# Patient Record
Sex: Male | Born: 1965 | Race: Black or African American | Hispanic: No | Marital: Single | Smoking: Former smoker
Health system: Southern US, Community
[De-identification: ages and names within clinical notes are randomized; demographics above are authoritative.]

## PROBLEM LIST (undated history)

## (undated) HISTORY — PX: ABDOMINAL SURGERY: SHX537

---

## 2006-04-11 ENCOUNTER — Emergency Department (HOSPITAL_COMMUNITY): Admission: EM | Admit: 2006-04-11 | Discharge: 2006-04-11 | Payer: Self-pay | Admitting: Emergency Medicine

## 2007-01-03 ENCOUNTER — Emergency Department (HOSPITAL_COMMUNITY): Admission: EM | Admit: 2007-01-03 | Discharge: 2007-01-03 | Payer: Self-pay | Admitting: Emergency Medicine

## 2011-05-31 ENCOUNTER — Emergency Department (HOSPITAL_COMMUNITY): Payer: Self-pay

## 2011-05-31 ENCOUNTER — Encounter: Payer: Self-pay | Admitting: *Deleted

## 2011-05-31 ENCOUNTER — Emergency Department (HOSPITAL_COMMUNITY)
Admission: EM | Admit: 2011-05-31 | Discharge: 2011-05-31 | Disposition: A | Discharge status: Home / Self Care | Payer: Self-pay | Attending: Emergency Medicine | Admitting: Emergency Medicine

## 2011-05-31 DIAGNOSIS — S82202B Unspecified fracture of shaft of left tibia, initial encounter for open fracture type I or II: Secondary | ICD-10-CM

## 2011-05-31 DIAGNOSIS — M79609 Pain in unspecified limb: Secondary | ICD-10-CM | POA: Insufficient documentation

## 2011-05-31 DIAGNOSIS — W3400XA Accidental discharge from unspecified firearms or gun, initial encounter: Secondary | ICD-10-CM | POA: Insufficient documentation

## 2011-05-31 DIAGNOSIS — S81809A Unspecified open wound, unspecified lower leg, initial encounter: Secondary | ICD-10-CM | POA: Insufficient documentation

## 2011-05-31 DIAGNOSIS — M25579 Pain in unspecified ankle and joints of unspecified foot: Secondary | ICD-10-CM | POA: Insufficient documentation

## 2011-05-31 DIAGNOSIS — Y9229 Other specified public building as the place of occurrence of the external cause: Secondary | ICD-10-CM | POA: Insufficient documentation

## 2011-05-31 DIAGNOSIS — S82209B Unspecified fracture of shaft of unspecified tibia, initial encounter for open fracture type I or II: Secondary | ICD-10-CM | POA: Insufficient documentation

## 2011-05-31 DIAGNOSIS — S81009A Unspecified open wound, unspecified knee, initial encounter: Secondary | ICD-10-CM | POA: Insufficient documentation

## 2011-05-31 MED ORDER — CEFAZOLIN SODIUM 1-5 GM-% IV SOLN
1.0000 g | Freq: Once | INTRAVENOUS | Status: AC
  Administered 2011-05-31: 1 g via INTRAVENOUS
  Filled 2011-05-31: qty 50

## 2011-05-31 MED ORDER — FENTANYL CITRATE 0.05 MG/ML IJ SOLN
50.0000 ug | Freq: Once | INTRAMUSCULAR | Status: AC
  Administered 2011-05-31: 50 ug via INTRAVENOUS
  Filled 2011-05-31: qty 2

## 2011-05-31 MED ORDER — IBUPROFEN 800 MG PO TABS: 800.0000 mg | ORAL_TABLET | Freq: Three times a day (TID) | ORAL | Status: AC

## 2011-05-31 MED ORDER — CEPHALEXIN 500 MG PO CAPS: 500.0000 mg | ORAL_CAPSULE | Freq: Four times a day (QID) | ORAL | Status: AC

## 2011-05-31 MED ORDER — FENTANYL CITRATE 0.05 MG/ML IJ SOLN
50.0000 ug | Freq: Once | INTRAMUSCULAR | Status: AC
  Administered 2011-05-31: 50 ug via INTRAVENOUS

## 2011-05-31 MED ORDER — ONDANSETRON HCL 4 MG/2ML IJ SOLN
4.0000 mg | Freq: Once | INTRAMUSCULAR | Status: AC
  Administered 2011-05-31: 4 mg via INTRAVENOUS
  Filled 2011-05-31: qty 2

## 2011-05-31 MED ORDER — FENTANYL CITRATE 0.05 MG/ML IJ SOLN
100.0000 ug | Freq: Once | INTRAMUSCULAR | Status: AC
  Administered 2011-05-31: 100 ug via INTRAVENOUS
  Filled 2011-05-31: qty 2

## 2011-05-31 MED ORDER — TETANUS-DIPHTH-ACELL PERTUSSIS 5-2.5-18.5 LF-MCG/0.5 IM SUSP
0.5000 mL | Freq: Once | INTRAMUSCULAR | Status: AC
  Administered 2011-05-31: 0.5 mL via INTRAMUSCULAR
  Filled 2011-05-31: qty 0.5

## 2011-05-31 MED ORDER — HYDROCODONE-ACETAMINOPHEN 5-500 MG PO TABS: 1.0000 | ORAL_TABLET | Freq: Four times a day (QID) | ORAL | Status: AC | PRN

## 2011-05-31 NOTE — Progress Notes (Signed)
Orthopedic Tech Progress Note Patient Details:  Hector Manning July 29, 1965 [id number]  Type of Splint: Long leg Splint Location: applied to left leg crutches  Gaye Pollack 05/31/2011, 8:01 AM
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
## 2011-05-31 NOTE — Consult Note (Signed)
Full consult dictated [phone] Dictation number

## 2011-05-31 NOTE — ED Notes (Signed)
Ortho tech at the bedside applying cast
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
## 2013-03-15 ENCOUNTER — Encounter (HOSPITAL_COMMUNITY): Payer: Self-pay | Admitting: Emergency Medicine

## 2013-03-15 ENCOUNTER — Emergency Department (HOSPITAL_COMMUNITY)
Admission: EM | Admit: 2013-03-15 | Discharge: 2013-03-15 | Disposition: A | Discharge status: Home / Self Care | Payer: Self-pay | Attending: Emergency Medicine | Admitting: Emergency Medicine

## 2013-03-15 ENCOUNTER — Emergency Department (HOSPITAL_COMMUNITY): Payer: Self-pay

## 2013-03-15 DIAGNOSIS — M5430 Sciatica, unspecified side: Secondary | ICD-10-CM | POA: Insufficient documentation

## 2013-03-15 DIAGNOSIS — M5431 Sciatica, right side: Secondary | ICD-10-CM

## 2013-03-15 DIAGNOSIS — Y9389 Activity, other specified: Secondary | ICD-10-CM | POA: Insufficient documentation

## 2013-03-15 DIAGNOSIS — Y929 Unspecified place or not applicable: Secondary | ICD-10-CM | POA: Insufficient documentation

## 2013-03-15 DIAGNOSIS — X12XXXA Contact with other hot fluids, initial encounter: Secondary | ICD-10-CM | POA: Insufficient documentation

## 2013-03-15 DIAGNOSIS — Y990 Civilian activity done for income or pay: Secondary | ICD-10-CM | POA: Insufficient documentation

## 2013-03-15 DIAGNOSIS — F17200 Nicotine dependence, unspecified, uncomplicated: Secondary | ICD-10-CM | POA: Insufficient documentation

## 2013-03-15 MED ORDER — PREDNISONE 20 MG PO TABS: 40.0000 mg | ORAL_TABLET | Freq: Every day | ORAL | Status: DC

## 2013-03-15 MED ORDER — TRAMADOL HCL 50 MG PO TABS: 50.0000 mg | ORAL_TABLET | Freq: Four times a day (QID) | ORAL | Status: DC | PRN

## 2013-03-15 MED ORDER — DIAZEPAM 5 MG PO TABS
5.0000 mg | ORAL_TABLET | Freq: Once | ORAL | Status: AC
  Administered 2013-03-15: 5 mg via ORAL
  Filled 2013-03-15: qty 1

## 2013-03-15 MED ORDER — CYCLOBENZAPRINE HCL 10 MG PO TABS: 10.0000 mg | ORAL_TABLET | Freq: Two times a day (BID) | ORAL | Status: DC | PRN

## 2013-03-15 MED ORDER — KETOROLAC TROMETHAMINE 60 MG/2ML IM SOLN
60.0000 mg | Freq: Once | INTRAMUSCULAR | Status: AC
Start: 2013-03-15 — End: 2013-03-15
  Administered 2013-03-15: 60 mg via INTRAMUSCULAR
  Filled 2013-03-15: qty 2

## 2013-03-15 MED ORDER — PREDNISONE 20 MG PO TABS
40.0000 mg | ORAL_TABLET | Freq: Once | ORAL | Status: AC
  Administered 2013-03-15: 40 mg via ORAL
  Filled 2013-03-15: qty 2

## 2013-03-15 NOTE — ED Notes (Signed)
Patient presents to ED with c/o back pain for 3 weeks and right foot is numb for 2 weeks. Patient states he was at work 3 weeks ago and bent over when his back started hurting.

## 2013-03-15 NOTE — ED Provider Notes (Signed)
CSN: [id number]     Arrival date & time 03/15/13  0854 History   First MD Initiated Contact with Patient 03/15/13 [phone]     Chief Complaint  Patient presents with  . Back Pain   (Consider location/radiation/quality/duration/timing/severity/associated sxs/prior Treatment) HPI Comments: Patient is a 47 year old male who presents with sudden onset of lower back pain that started this 3 weeks ago. 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 The pain is aching and severe and radiates down his right leg. The pain is constant. Movement makes the pain worse. Nothing makes the pain better. Patient has not tried anything for pain. No associated symptoms. No saddle paresthesias or bladder/bowel incontinence.     Patient is a 47 y.o. male presenting with back pain.  Back Pain   History reviewed. No pertinent past medical history. History reviewed. No pertinent past surgical history. History reviewed. No pertinent family history. History  Substance Use Topics  . Smoking status: Current Every Day Smoker  . Smokeless tobacco: Not on file  . Alcohol Use: Yes    Review of Systems  Musculoskeletal: Positive for back pain.  All other systems reviewed and are negative.    Allergies  Review of patient's allergies indicates no known allergies.  Home Medications  No current outpatient prescriptions on file. BP 123/75  Pulse 71  Temp(Src) 98 F (36.7 C) (Oral)  Resp 18  Ht 5\' 11"  (1.803 m)  Wt 218 lb (98.884 kg)  BMI 30.42 kg/m2  SpO2 99% Physical Exam  Nursing note and vitals reviewed. Constitutional: He is oriented to person, place, and time. He appears well-developed and well-nourished. No distress.  HENT:  Head: Normocephalic and atraumatic.  Eyes: Conjunctivae and EOM are normal.  Neck: Normal range of motion.  Cardiovascular: Normal rate and regular rhythm.  Exam reveals no gallop and no friction rub.   No murmur  heard. Pulmonary/Chest: Effort normal and breath sounds normal. He has no wheezes. He has no rales. He exhibits no tenderness.  Abdominal: Soft. He exhibits no distension. There is no tenderness. There is no rebound and no guarding.  Musculoskeletal: Normal range of motion.  Right lumbosacral paraspinal tenderness to palpation. No midline spine tenderness to palpation.   Neurological: He is alert and oriented to person, place, and time. Coordination normal.  Lower extremity strength intact and equal bilaterally. Patient reports diminished sensation over lateral right leg. Speech is goal-oriented. Moves limbs without ataxia.   Skin: Skin is warm and dry.  Psychiatric: He has a normal mood and affect. His behavior is normal.    ED Course  Procedures (including critical care time) Labs Review Labs Reviewed - No data to display Imaging Review Dg Lumbar Spine Complete  03/15/2013   CLINICAL DATA:  Low back pain with right-sided radicular symptoms  EXAM: LUMBAR SPINE - COMPLETE 4+ VIEW  COMPARISON:  None.  FINDINGS: Frontal, lateral, spot lumbosacral lateral, and bilateral oblique views were obtained. There are 5 non-rib-bearing lumbar type vertebral bodies. There is no fracture or spondylolisthesis. Disk spaces appear intact. There is no appreciable facette arthropathy.  IMPRESSION: No fracture or apparent arthropathy.   Electronically Signed   By: Bretta Bang   On: 03/15/2013 10:01    MDM   1. Sciatica, right     9:32 AM Lumbar spine xray pending. Patient will have IM toradol, PO valium and PO prednisone. No bladder/bowel incontinence or saddle paresthesias. Vitals stable  and patient afebrile.   10:06 AM Xray shows no acute changes or fracture. 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 Patient will be discharged with Tramadol, Flexeril and Prednisone. Patient instructed to return with worsening or concerning symptoms.     Emilia Beck, PA-C 03/15/13 1012

## 2013-03-16 NOTE — ED Provider Notes (Signed)
Medical screening examination/treatment/procedure(s) were performed by non-physician practitioner and as supervising physician I was immediately available for consultation/collaboration.   Joya Gaskins, MD 03/16/13 1122

## 2014-08-16 IMAGING — CR DG LUMBAR SPINE COMPLETE 4+V
5 series · 5 of 5 positions shown · non-contrast
Comparison: None.

CLINICAL DATA: Low back pain with right-sided radicular symptoms

EXAM:
LUMBAR SPINE - COMPLETE 4+ VIEW

[t lumbar spine ap]
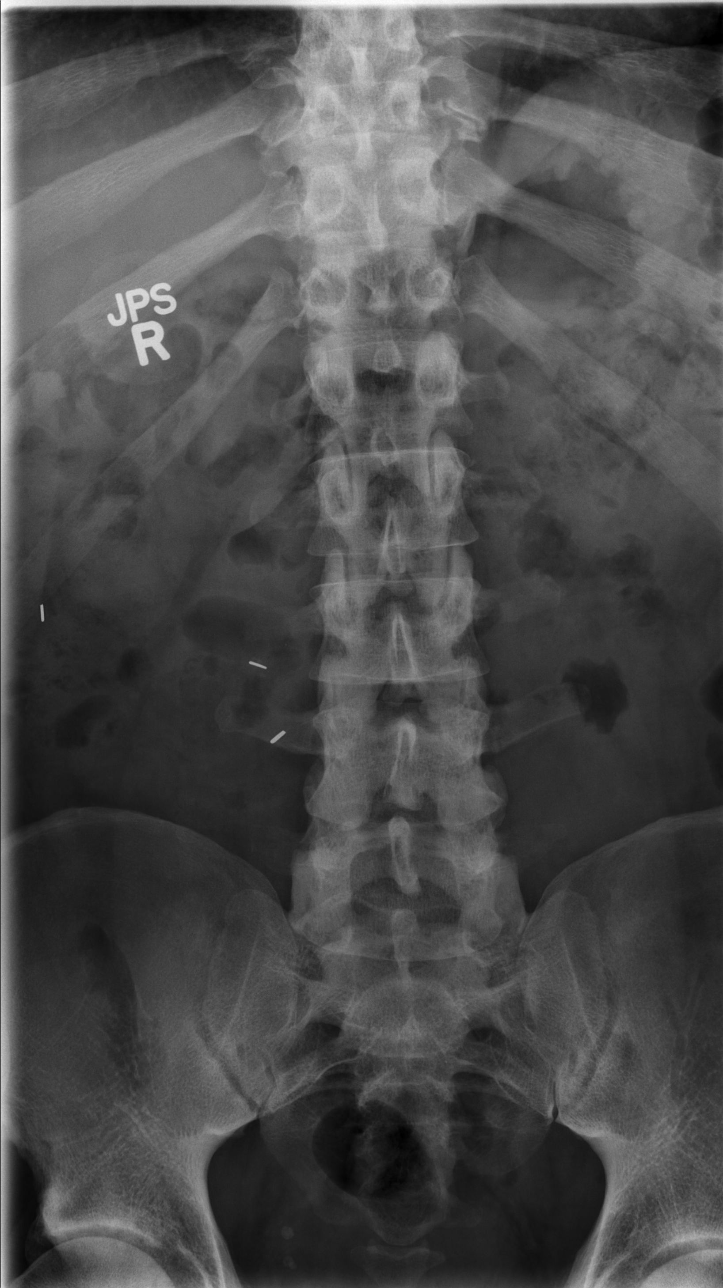

[t lumbar spine obl (1 of 2)]
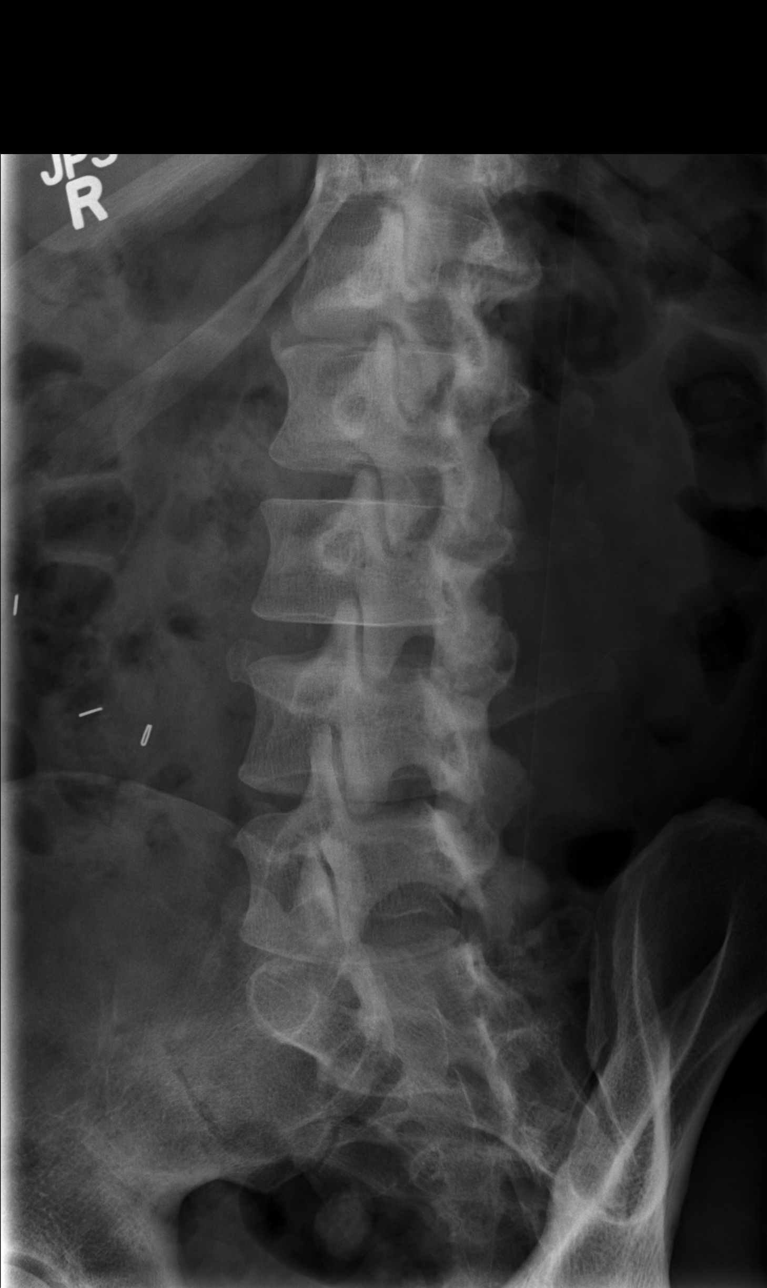

[t lumbar spine obl (2 of 2)]
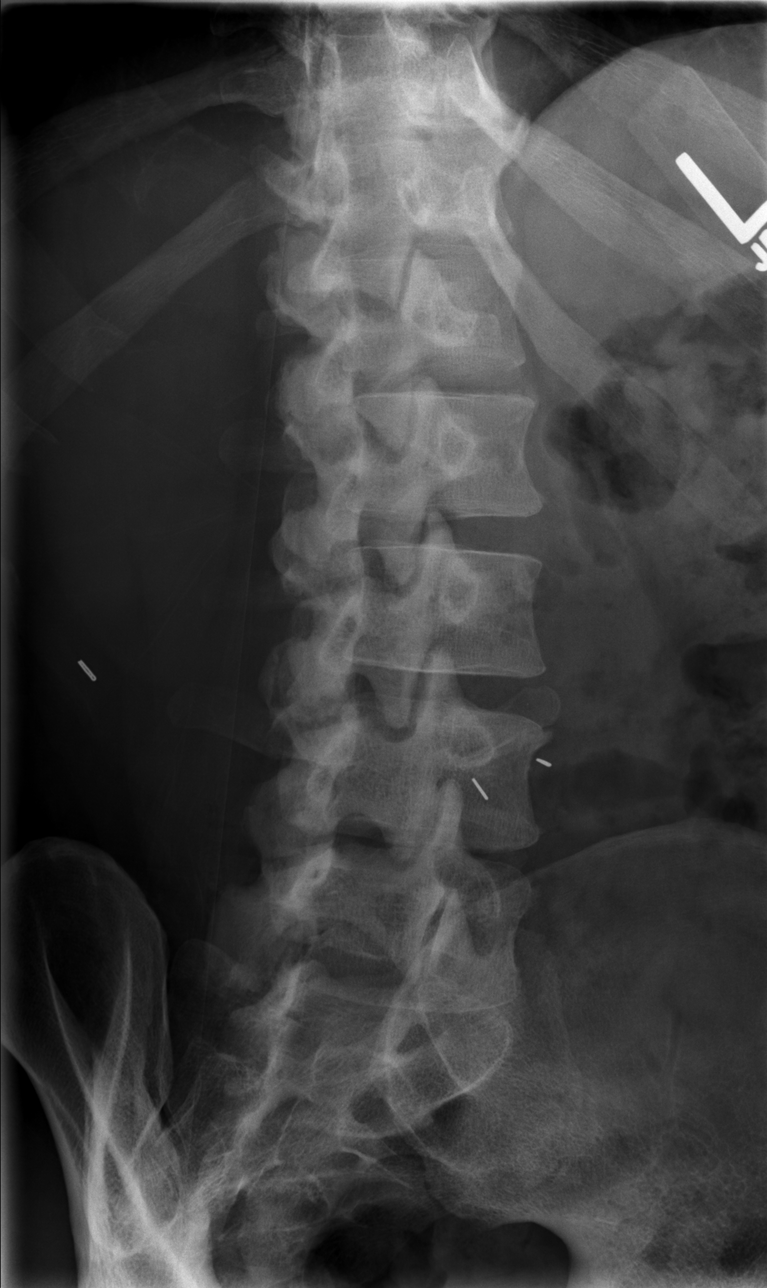

[t lumbar spine lat]
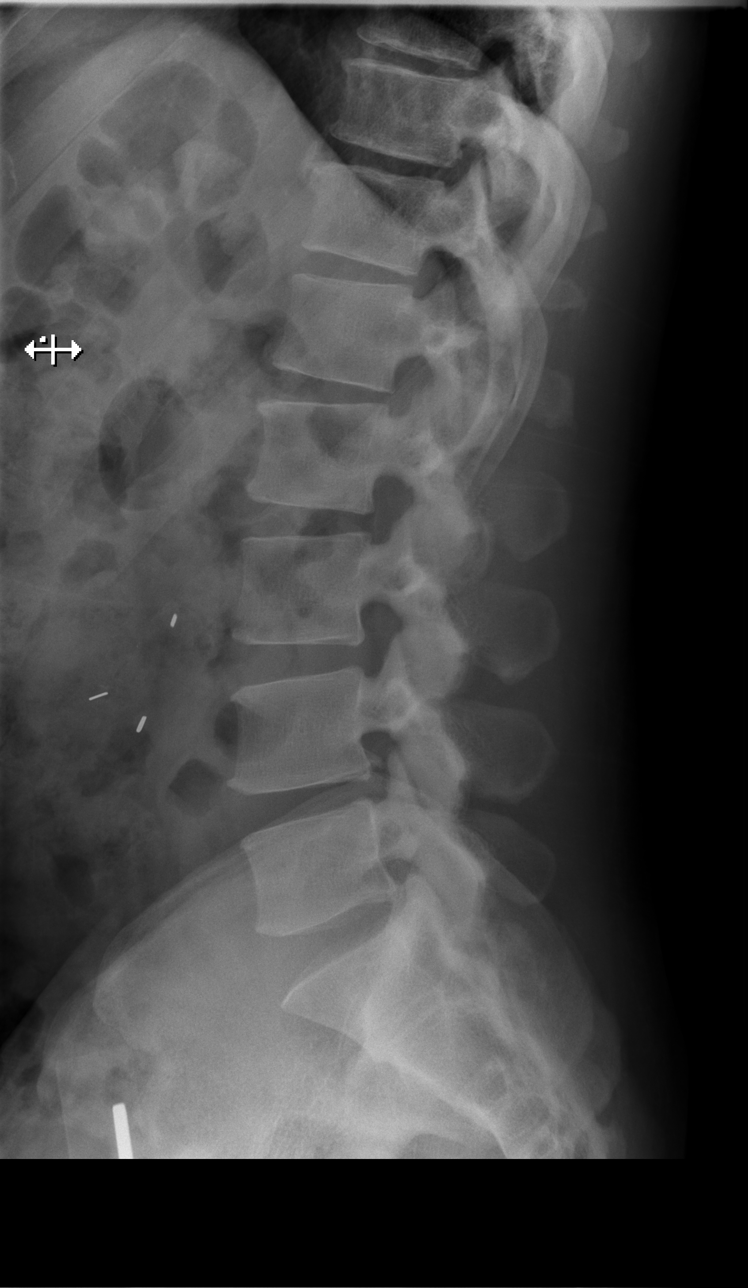

[t lumbar l-5 s-1 spot]
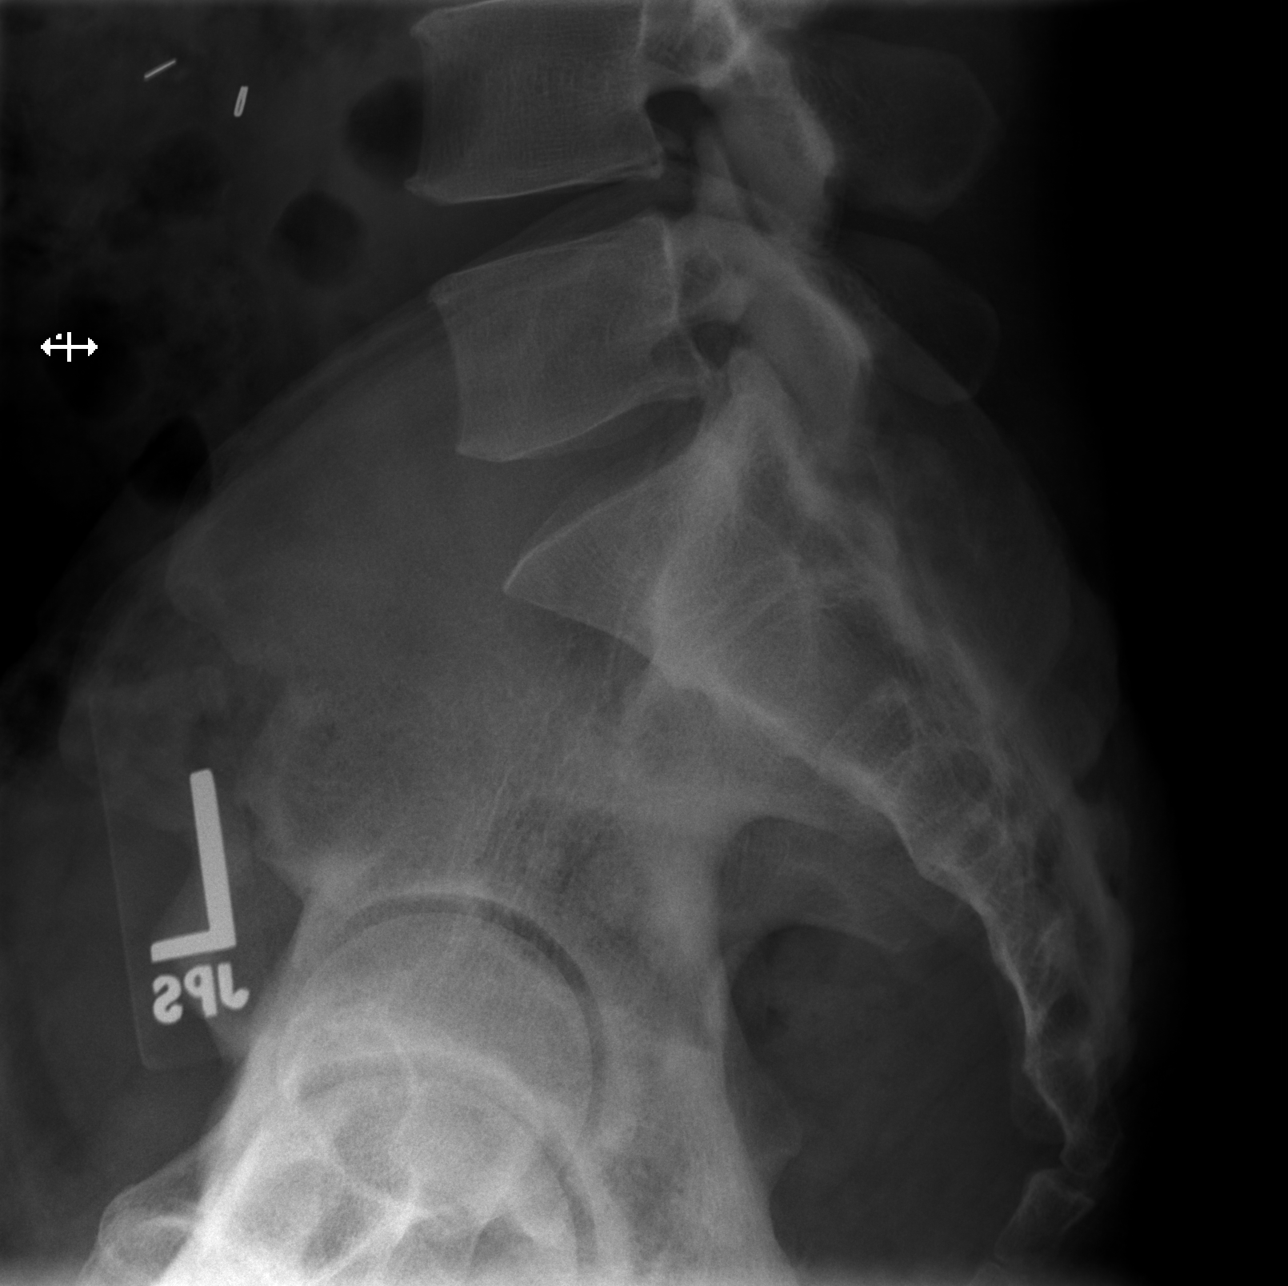

[5 of 5 positions shown; findings below may reference images not displayed]

FINDINGS: Frontal, lateral, spot lumbosacral lateral, and bilateral oblique
views were obtained. There are 5 non-rib-bearing lumbar type
vertebral bodies. There is no fracture or spondylolisthesis. Disk
spaces appear intact. There is no appreciable facette arthropathy.
IMPRESSION: No fracture or apparent arthropathy.

## 2014-12-20 ENCOUNTER — Emergency Department (HOSPITAL_COMMUNITY)
Admission: EM | Admit: 2014-12-20 | Discharge: 2014-12-20 | Discharge status: Against Medical Advice | Payer: Self-pay | Attending: Emergency Medicine | Admitting: Emergency Medicine

## 2014-12-20 ENCOUNTER — Encounter (HOSPITAL_COMMUNITY): Payer: Self-pay | Admitting: *Deleted

## 2014-12-20 DIAGNOSIS — Z720 Tobacco use: Secondary | ICD-10-CM | POA: Insufficient documentation

## 2014-12-20 DIAGNOSIS — R109 Unspecified abdominal pain: Secondary | ICD-10-CM | POA: Insufficient documentation

## 2014-12-20 MED ORDER — ONDANSETRON 4 MG PO TBDP
ORAL_TABLET | ORAL | Status: AC
  Filled 2014-12-20: qty 2

## 2014-12-20 MED ORDER — OXYCODONE-ACETAMINOPHEN 5-325 MG PO TABS
ORAL_TABLET | ORAL | Status: AC
  Filled 2014-12-20: qty 1

## 2014-12-20 MED ORDER — OXYCODONE-ACETAMINOPHEN 5-325 MG PO TABS
1.0000 | ORAL_TABLET | Freq: Once | ORAL | Status: AC
  Administered 2014-12-20: 1 via ORAL

## 2014-12-20 MED ORDER — ONDANSETRON 4 MG PO TBDP
8.0000 mg | ORAL_TABLET | Freq: Once | ORAL | Status: AC
Start: 2014-12-20 — End: 2014-12-20
  Administered 2014-12-20: 8 mg via ORAL

## 2014-12-20 NOTE — ED Notes (Signed)
Pt reports intermittent lower abdominal pain since 2000 tonight. Pt c/o some nausea with the pain, denies v/d.

## 2014-12-20 NOTE — ED Notes (Signed)
Family member came to the front desk stated to nurse patient had left and wanted someone to know.

## 2014-12-21 LAB — COMPREHENSIVE METABOLIC PANEL
ALBUMIN: 3.6 g/dL (ref 3.5–5.0)
ALK PHOS: 52 U/L (ref 38–126)
ALT: 117 U/L — AB (ref 17–63)
ANION GAP: 10 (ref 5–15)
AST: 69 U/L — ABNORMAL HIGH (ref 15–41)
BUN: 14 mg/dL (ref 6–20)
CALCIUM: 8.9 mg/dL (ref 8.9–10.3)
CO2: 25 mmol/L (ref 22–32)
Chloride: 102 mmol/L (ref 101–111)
Creatinine, Ser: 1.14 mg/dL (ref 0.61–1.24)
GFR calc non Af Amer: >60 mL/min (ref >60)
GLUCOSE: 99 mg/dL (ref 65–99)
POTASSIUM: 3.5 mmol/L (ref 3.5–5.1)
SODIUM: 137 mmol/L (ref 135–145)
TOTAL PROTEIN: 6.6 g/dL (ref 6.5–8.1)
Total Bilirubin: 0.4 mg/dL (ref 0.3–1.2)

## 2014-12-21 LAB — CBC WITH DIFFERENTIAL/PLATELET
BASOS PCT: 0 % (ref 0–1)
Basophils Absolute: 0 10*3/uL (ref 0.0–0.1)
EOS ABS: 0.3 10*3/uL (ref 0.0–0.7)
Eosinophils Relative: 5 % (ref 0–5)
HEMATOCRIT: 45 % (ref 39.0–52.0)
Hemoglobin: 15.1 g/dL (ref 13.0–17.0)
LYMPHS PCT: 29 % (ref 12–46)
Lymphs Abs: 1.6 10*3/uL (ref 0.7–4.0)
MCH: 30.8 pg (ref 26.0–34.0)
MCHC: 33.6 g/dL (ref 30.0–36.0)
MCV: 91.8 fL (ref 78.0–100.0)
Monocytes Absolute: 0.7 10*3/uL (ref 0.1–1.0)
Monocytes Relative: 14 % — ABNORMAL HIGH (ref 3–12)
Neutro Abs: 2.8 10*3/uL (ref 1.7–7.7)
Neutrophils Relative %: 52 % (ref 43–77)
PLATELETS: 183 10*3/uL (ref 150–400)
RBC: 4.9 MIL/uL (ref 4.22–5.81)
RDW: 13.6 % (ref 11.5–15.5)
WBC: 5.4 10*3/uL (ref 4.0–10.5)

## 2014-12-21 LAB — LIPASE, BLOOD: LIPASE: 116 U/L — AB (ref 22–51)

## 2015-05-04 ENCOUNTER — Emergency Department (HOSPITAL_COMMUNITY)
Admission: EM | Admit: 2015-05-04 | Discharge: 2015-05-04 | Disposition: A | Discharge status: Home / Self Care | Payer: Managed Care, Other (non HMO) | Attending: Emergency Medicine | Admitting: Emergency Medicine

## 2015-05-04 ENCOUNTER — Encounter (HOSPITAL_COMMUNITY): Payer: Self-pay | Admitting: Emergency Medicine

## 2015-05-04 DIAGNOSIS — S8392XA Sprain of unspecified site of left knee, initial encounter: Secondary | ICD-10-CM | POA: Insufficient documentation

## 2015-05-04 DIAGNOSIS — Z720 Tobacco use: Secondary | ICD-10-CM | POA: Insufficient documentation

## 2015-05-04 DIAGNOSIS — Y9289 Other specified places as the place of occurrence of the external cause: Secondary | ICD-10-CM | POA: Insufficient documentation

## 2015-05-04 DIAGNOSIS — Y9389 Activity, other specified: Secondary | ICD-10-CM | POA: Insufficient documentation

## 2015-05-04 DIAGNOSIS — W108XXA Fall (on) (from) other stairs and steps, initial encounter: Secondary | ICD-10-CM | POA: Diagnosis not present

## 2015-05-04 DIAGNOSIS — S3992XA Unspecified injury of lower back, initial encounter: Secondary | ICD-10-CM | POA: Insufficient documentation

## 2015-05-04 DIAGNOSIS — Z7952 Long term (current) use of systemic steroids: Secondary | ICD-10-CM | POA: Diagnosis not present

## 2015-05-04 DIAGNOSIS — Y998 Other external cause status: Secondary | ICD-10-CM | POA: Diagnosis not present

## 2015-05-04 DIAGNOSIS — S8992XA Unspecified injury of left lower leg, initial encounter: Secondary | ICD-10-CM | POA: Diagnosis present

## 2015-05-04 MED ORDER — IBUPROFEN 800 MG PO TABS: 800.0000 mg | ORAL_TABLET | Freq: Three times a day (TID) | ORAL | Status: DC | PRN

## 2015-05-04 MED ORDER — CYCLOBENZAPRINE HCL 10 MG PO TABS: 10.0000 mg | ORAL_TABLET | Freq: Two times a day (BID) | ORAL | Status: DC | PRN

## 2015-05-04 MED ORDER — IBUPROFEN 400 MG PO TABS
800.0000 mg | ORAL_TABLET | Freq: Once | ORAL | Status: AC
  Administered 2015-05-04: 800 mg via ORAL
  Filled 2015-05-04: qty 2

## 2015-05-04 NOTE — ED Provider Notes (Signed)
CSN: [id number]     Arrival date & time 05/04/15  1805 History  By signing my name below, I, Elon SpannerGarrett Cook, attest that this documentation has been prepared under the direction and in the presence of Fayrene HelperBowie Yazeed Pryer, PA-C. Electronically Signed: Elon SpannerGarrett Cook ED Scribe. 05/04/2015. 6:37 PM.    Chief Complaint  Patient presents with  . Fall   The history is provided by the patient. No language interpreter was used.   HPI Comments: Hector Manning is a 49 y.o. male who presents to the Emergency Department complaining of a mechanical fall at 3:00 pm today. The patient reports he lost his footing and fell forward down 8 steps.  He denies head trauma, LOC and complains currently of left knee pain and lower back pain. He is able to ambulate and does not believe he has a fx.  He denies lightheadedness preceding the fall, other complaints.     History reviewed. No pertinent past medical history. Past Surgical History  Procedure Laterality Date  . Abdominal surgery     History reviewed. No pertinent family history. Social History  Substance Use Topics  . Smoking status: Current Every Day Smoker    Types: Cigarettes  . Smokeless tobacco: Never Used  . Alcohol Use: Yes    Review of Systems  Constitutional: Negative for fever.  Musculoskeletal: Positive for back pain and arthralgias.      Allergies  Review of patient's allergies indicates no known allergies.  Home Medications   Prior to Admission medications   Medication Sig Start Date End Date Taking? Authorizing Provider  cyclobenzaprine (FLEXERIL) 10 MG tablet Take 1 tablet (10 mg total) by mouth 2 (two) times daily as needed for muscle spasms. 03/15/13   Kaitlyn Szekalski, PA-C  predniSONE (DELTASONE) 20 MG tablet Take 2 tablets (40 mg total) by mouth daily. Take 40 mg by mouth daily for 3 days, then 20mg  by mouth daily for 3 days, then 10mg  daily for 3 days 03/15/13   Emilia BeckKaitlyn Szekalski, PA-C  traMADol (ULTRAM) 50 MG tablet Take 1 tablet (50  mg total) by mouth every 6 (six) hours as needed for pain. 03/15/13   Kaitlyn Szekalski, PA-C   BP 133/75 mmHg  Pulse 86  Temp(Src) 98.3 F (36.8 C) (Oral)  Resp 14  SpO2 99% Physical Exam  Constitutional: He is oriented to person, place, and time. He appears well-developed and well-nourished. No distress.  HENT:  Head: Normocephalic and atraumatic.  Eyes: Conjunctivae and EOM are normal.  Neck: Neck supple. No tracheal deviation present.  Cardiovascular: Normal rate.   Pulmonary/Chest: Effort normal. No respiratory distress.  Musculoskeletal: Normal range of motion.  No significant midline spine tenderness.  Left knee point tenderness noted to the medial knee at the joint line on palpation.  Negative anterior and posterior drawer test.  Normal varus valgus maneuver.  Able to ambulate.    Neurological: He is alert and oriented to person, place, and time.  Skin: Skin is warm and dry.  Psychiatric: He has a normal mood and affect. His behavior is normal.  Nursing note and vitals reviewed.   ED Course  Procedures (including critical care time)  DIAGNOSTIC STUDIES: Oxygen Saturation is 99% on RA, normal by my interpretation.    COORDINATION OF CARE:  6:36 PM suspect L knee sprrain.  Knee sleeve given.  Will prescribe anti-inflammatory and muscle relaxant.  Will provide orthopaedic referral.  Patient acknowledges and agrees with plan.       MDM   Final  diagnoses:  Fall down steps, initial encounter  Left knee sprain, initial encounter    BP 133/75 mmHg  Pulse 86  Temp(Src) 98.3 F (36.8 C) (Oral)  Resp 14  SpO2 99%  I personally performed the services described in this documentation, which was scribed in my presence. The recorded information has been reviewed and is accurate.      Fayrene Helper, PA-C 05/04/15 1840  Fayrene Helper, PA-C 05/04/15 2159  Pricilla Loveless, MD 05/07/15 [phone]

## 2015-05-04 NOTE — ED Notes (Signed)
Pt sts lower back pain and left knee pain after tripping and falling on steps this morning

## 2015-05-04 NOTE — Discharge Instructions (Signed)

## 2015-06-15 ENCOUNTER — Emergency Department (HOSPITAL_COMMUNITY)
Admission: EM | Admit: 2015-06-15 | Discharge: 2015-06-15 | Disposition: A | Discharge status: Home / Self Care | Payer: Managed Care, Other (non HMO) | Attending: Emergency Medicine | Admitting: Emergency Medicine

## 2015-06-15 ENCOUNTER — Encounter (HOSPITAL_COMMUNITY): Payer: Self-pay | Admitting: Emergency Medicine

## 2015-06-15 DIAGNOSIS — F17210 Nicotine dependence, cigarettes, uncomplicated: Secondary | ICD-10-CM | POA: Insufficient documentation

## 2015-06-15 DIAGNOSIS — B350 Tinea barbae and tinea capitis: Secondary | ICD-10-CM | POA: Insufficient documentation

## 2015-06-15 DIAGNOSIS — R51 Headache: Secondary | ICD-10-CM | POA: Diagnosis not present

## 2015-06-15 DIAGNOSIS — L659 Nonscarring hair loss, unspecified: Secondary | ICD-10-CM | POA: Insufficient documentation

## 2015-06-15 DIAGNOSIS — L739 Follicular disorder, unspecified: Secondary | ICD-10-CM | POA: Diagnosis not present

## 2015-06-15 DIAGNOSIS — R519 Headache, unspecified: Secondary | ICD-10-CM

## 2015-06-15 LAB — BASIC METABOLIC PANEL
Anion gap: 10 (ref 5–15)
BUN: 12 mg/dL (ref 6–20)
CALCIUM: 9 mg/dL (ref 8.9–10.3)
CHLORIDE: 106 mmol/L (ref 101–111)
CO2: 22 mmol/L (ref 22–32)
CREATININE: 1.09 mg/dL (ref 0.61–1.24)
GFR calc non Af Amer: >60 mL/min (ref >60)
Glucose, Bld: 84 mg/dL (ref 65–99)
Potassium: 3.7 mmol/L (ref 3.5–5.1)
Sodium: 138 mmol/L (ref 135–145)

## 2015-06-15 LAB — CBC WITH DIFFERENTIAL/PLATELET
Basophils Absolute: 0 10*3/uL (ref 0.0–0.1)
Basophils Relative: 1 %
Eosinophils Absolute: 0.2 10*3/uL (ref 0.0–0.7)
Eosinophils Relative: 3 %
HCT: 43.9 % (ref 39.0–52.0)
Hemoglobin: 14.7 g/dL (ref 13.0–17.0)
LYMPHS PCT: 32 %
Lymphs Abs: 2.1 10*3/uL (ref 0.7–4.0)
MCH: 31 pg (ref 26.0–34.0)
MCHC: 33.5 g/dL (ref 30.0–36.0)
MCV: 92.6 fL (ref 78.0–100.0)
Monocytes Absolute: 0.7 10*3/uL (ref 0.1–1.0)
Monocytes Relative: 11 %
NEUTROS ABS: 3.5 10*3/uL (ref 1.7–7.7)
Neutrophils Relative %: 53 %
Platelets: 199 10*3/uL (ref 150–400)
RBC: 4.74 MIL/uL (ref 4.22–5.81)
RDW: 13.1 % (ref 11.5–15.5)
WBC: 6.5 10*3/uL (ref 4.0–10.5)

## 2015-06-15 MED ORDER — GRISEOFULVIN MICROSIZE 500 MG PO TABS
500.0000 mg | ORAL_TABLET | Freq: Every day | ORAL | Status: DC
Start: 2015-06-15 — End: 2021-06-25

## 2015-06-15 MED ORDER — KETOROLAC TROMETHAMINE 30 MG/ML IJ SOLN
30.0000 mg | Freq: Once | INTRAMUSCULAR | Status: AC
Start: 2015-06-15 — End: 2015-06-15
  Administered 2015-06-15: 30 mg via INTRAVENOUS
  Filled 2015-06-15: qty 1

## 2015-06-15 MED ORDER — IBUPROFEN 800 MG PO TABS: 800.0000 mg | ORAL_TABLET | Freq: Three times a day (TID) | ORAL | Status: DC

## 2015-06-15 MED ORDER — PROMETHAZINE HCL 25 MG/ML IJ SOLN
25.0000 mg | Freq: Once | INTRAMUSCULAR | Status: AC
  Administered 2015-06-15: 25 mg via INTRAVENOUS
  Filled 2015-06-15: qty 1

## 2015-06-15 MED ORDER — SULFAMETHOXAZOLE-TRIMETHOPRIM 800-160 MG PO TABS: 1.0000 | ORAL_TABLET | Freq: Two times a day (BID) | ORAL | Status: AC

## 2015-06-15 MED ORDER — PROMETHAZINE HCL 25 MG PO TABS: 25.0000 mg | ORAL_TABLET | Freq: Four times a day (QID) | ORAL | Status: DC | PRN

## 2015-06-15 NOTE — Discharge Instructions (Signed)
As we discussed, I will be giving you a prescription for an anti-biotic and anti-fungal for your scalp. Please take the entire course as prescribed. Please contact one of the clinics below to establish primary care and follow-up within one week for re-evaluation.   Take medications as prescribed. Return to the emergency room for worsening condition or new concerning symptoms. Follow up with your regular doctor. If you don't have a regular doctor use one of the numbers below to establish a primary care doctor.   Emergency Department Resource Guide 1) Find a Doctor and Pay Out of Pocket Although you won't have to find out who is covered by your insurance plan, it is a good idea to ask around and get recommendations. You will then need to call the office and see if the doctor you have chosen will accept you as a new patient and what types of options they offer for patients who are self-pay. Some doctors offer discounts or will set up payment plans for their patients who do not have insurance, but you will need to ask so you aren't surprised when you get to your appointment.  2) Contact Your Local Health Department Not all health departments have doctors that can see patients for sick visits, but many do, so it is worth a call to see if yours does. If you don't know where your local health department is, you can check in your phone book. The CDC also has a tool to help you locate your state's health department, and many state websites also have listings of all of their local health departments.  3) Find a Walk-in Clinic If your illness is not likely to be very severe or complicated, you may want to try a walk in clinic. These are popping up all over the country in pharmacies, drugstores, and shopping centers. They're usually staffed by nurse practitioners or physician assistants that have been trained to treat common illnesses and complaints. They're usually fairly quick and inexpensive. However, if you  have serious medical issues or chronic medical problems, these are probably not your best option.  No Primary Care Doctor: - Call Health Connect at  [phone] - they can help you locate a primary care doctor that  accepts your insurance, provides certain services, etc. - Physician Referral [phone]- [phone]  Emergency Department Resource Guide 1) Find a Doctor and Pay Out of Pocket Although you won't have to find out who is covered by your insurance plan, it is a good idea to ask around and get recommendations. You will then need to call the office and see if the doctor you have chosen will accept you as a new patient and what types of options they offer for patients who are self-pay. Some doctors offer discounts or will set up payment plans for their patients who do not have insurance, but you will need to ask so you aren't surprised when you get to your appointment.  2) Contact Your Local Health Department Not all health departments have doctors that can see patients for sick visits, but many do, so it is worth a call to see if yours does. If you don't know where your local health department is, you can check in your phone book. The CDC also has a tool to help you locate your state's health department, and many state websites also have listings of all of their local health departments.  3) Find a Walk-in Clinic If your illness is not likely to be very severe or complicated,  you may want to try a walk in clinic. These are popping up all over the country in pharmacies, drugstores, and shopping centers. They're usually staffed by nurse practitioners or physician assistants that have been trained to treat common illnesses and complaints. They're usually fairly quick and inexpensive. However, if you have serious medical issues or chronic medical problems, these are probably not your best option.  No Primary Care Doctor: - Call Health Connect at  [phone] - they can help you locate a primary care  doctor that  accepts your insurance, provides certain services, etc. - Physician Referral Service- [phone]  Chronic Pain Problems: Organization         Address  Phone   Notes  [facility]  [phone] Patients need to be referred by their primary care doctor.   Medication Assistance: Organization         Address  Phone   Notes  [facility] Medication [facility] [address] [place] [place], [place] [address] [phone] --Must be a resident of [facility] -- Must have NO insurance coverage whatsoever (no Medicaid/ Medicare, etc.) -- The pt. MUST have a primary care doctor that directs their care regularly and follows them in the community   [facility]  [phone]   [employer]  [phone]    Agencies that provide inexpensive medical care: Organization         Address  Phone   Notes  [facility]  [phone]   [facility]    [phone]   [facility] [address] [place], [place] [address] [phone]   [facility] [address], [place] [phone]   [facility]    [phone]   [facility]    [phone]   [facility] and [facility]  [address], [place] Phone:  [phone], Fax:  [phone] Hours of Operation:  9 am - 6 pm, M-F.  Also accepts Medicaid/Medicare and self-pay.  [facility]  [address], [place] Phone: [phone], Fax: [phone]. Hours of Operation:  8:30 am - 5:30 pm, M-F.  Also accepts Medicaid and self-pay.  [facility] [place] [address], [place] Phone: [phone]   [facility] [address] Natasha Bence [place], [place] [phone], Ext. [phone] Mondays & Thursdays: 7-9 AM.  First 15 patients are seen on a first come, first serve basis.    Medicaid-accepting [facility]  Providers:  Organization         Address  Phone   Notes  [facility] [address], [place] [phone] Also accepts self-pay patients.  [facility] [address] Laurell Josephs [place], [place]  [phone]   [facility] [address], [place] [phone]   [facility] [address], [place] [phone]   Renaye Rakers [address], [place]   [phone] Only accepts [place] patients after they have their name applied to their card.   Self-Pay (no insurance) in [facility]:  Organization         Address  Phone   Notes  Sickle Cell Patients, [facility] [address] [place], [place] [phone]   [facility] [address] [place],  [place] [phone]   [facility] [address]  [place], [place] [phone]   [facility]/Dr. Osei-Bonsu  [address], [place] [address], [place] [phone] Phone number for both [place] and [place] locations is the same.  Urgent Medical and [facility] [address] [place] [phone]   [facility] [address], [place] or [address] [phone]   [facility] [address] [place] [phone], phone; [phone], fax Sees patients 1st and 3rd Saturday of every month.  Must not qualify for public or private insurance (i.e. Medicaid, Medicare, [place] [employer] Choice, Veterans' Benefits)  Household income should be no more than 200% of the poverty level The clinic cannot treat you if you are pregnant or think you are pregnant  Sexually transmitted diseases are not treated at the clinic.

## 2015-06-15 NOTE — ED Provider Notes (Signed)
CSN: [id number]     Arrival date & time 06/15/15  1622 History   First MD Initiated Contact with Patient 06/15/15 1649     Chief Complaint  Patient presents with  . Headache    HPI   Mr. Logan Boresvans is an 49 y.o. male with history of chronic headaches who presents to the ED for evaluation of headaches. He states he has had a throbbing headache behind his left eye for the past two weeks. He has taken OTC acetaminophen with no relief. Denies photophobia, phonophobia, n/v, fever, chills, visual disturbance, weakness, paresthesias. He states he has had headaches of the same location and character for years. States usually tylenol helps but this time it did not so he came to the ED for pain relief.  Pt is also complaining of multiple sores/purulent lesions to his scalp that have been present for the past month. He states they are painful. States they occasionally drain purulent material. He has noticed some spots of hair loss.   History reviewed. No pertinent past medical history. Past Surgical History  Procedure Laterality Date  . Abdominal surgery     No family history on file. Social History  Substance Use Topics  . Smoking status: Current Every Day Smoker -- 0.50 packs/day    Types: Cigarettes  . Smokeless tobacco: Never Used  . Alcohol Use: Yes     Comment: couple beers a day    Review of Systems  All other systems reviewed and are negative.     Allergies  Review of patient's allergies indicates no known allergies.  Home Medications   Prior to Admission medications   Medication Sig Start Date End Date Taking? Authorizing Provider  cyclobenzaprine (FLEXERIL) 10 MG tablet Take 1 tablet (10 mg total) by mouth 2 (two) times daily as needed for muscle spasms. 05/04/15   Fayrene HelperBowie Tran, PA-C  ibuprofen (ADVIL,MOTRIN) 800 MG tablet Take 1 tablet (800 mg total) by mouth every 8 (eight) hours as needed for moderate pain. 05/04/15   Fayrene HelperBowie Tran, PA-C  predniSONE (DELTASONE) 20 MG tablet Take 2  tablets (40 mg total) by mouth daily. Take 40 mg by mouth daily for 3 days, then 20mg  by mouth daily for 3 days, then 10mg  daily for 3 days 03/15/13   Emilia BeckKaitlyn Szekalski, PA-C  traMADol (ULTRAM) 50 MG tablet Take 1 tablet (50 mg total) by mouth every 6 (six) hours as needed for pain. 03/15/13   Kaitlyn Szekalski, PA-C   BP 121/76 mmHg  Pulse 88  Temp(Src) 99 F (37.2 C) (Oral)  Resp 16  Ht 5\' 10"  (1.778 m)  Wt 97.523 kg  BMI 30.85 kg/m2  SpO2 97% Physical Exam  Constitutional: He is oriented to person, place, and time.  HENT:  Right Ear: External ear normal.  Left Ear: External ear normal.  Nose: Nose normal.  Mouth/Throat: Oropharynx is clear and moist. No oropharyngeal exudate.  Scalp with multiple diffuse nodules, pustules, and areas of alopecia. There are few scattered areas of alopecia with scaling and central clearing, some with overlying pustules. These lesions are all tender. No erythema noted.  Eyes: Conjunctivae and EOM are normal. Pupils are equal, round, and reactive to light.  Neck: Full passive range of motion without pain. Neck supple. No spinous process tenderness present. No Brudzinski's sign and no Kernig's sign noted.  Cardiovascular: Normal rate, regular rhythm, normal heart sounds and intact distal pulses.   Pulmonary/Chest: Effort normal and breath sounds normal. No respiratory distress. He has no wheezes. He  exhibits no tenderness.  Abdominal: Soft. Bowel sounds are normal. He exhibits no distension. There is no tenderness.  Musculoskeletal: He exhibits no edema.  Neurological: He is alert and oriented to person, place, and time. He has normal strength. No cranial nerve deficit or sensory deficit. He displays a negative Romberg sign. Coordination and gait normal.  Skin: Skin is warm and dry.  Psychiatric: He has a normal mood and affect.  Nursing note and vitals reviewed.   ED Course  Procedures (including critical care time) Labs Review Labs Reviewed  CBC WITH  DIFFERENTIAL/PLATELET  BASIC METABOLIC PANEL    Imaging Review No results found. I have personally reviewed and evaluated these images and lab results as part of my medical decision-making.   EKG Interpretation None      MDM   Final diagnoses:  Acute nonintractable headache, unspecified headache type  Tinea capitis  Folliculitis   Given headache that is chronic with no new features, nonfocal neuro exam, no meningismus, will hold off on imaging. Will trial toradol and phenergan. Scalp lesions appear to be combination of dermatophyte/tinea capitus with concomittant pustules suggesting bacterial infection. Will give rx for bactrim and griseofulvin for home with resource guide to establish primary care for f/u and re-eval.   Pt reports resolution of headache. Labs otherwise unremarkable. Will give rx for nsaids and phenergan for home. Will give bactrim and griseofulvin as above. ER return precautions given.   Carlene Coria, PA-C 06/15/15 2254  Benjiman Core, MD 06/15/15 [phone]

## 2015-06-15 NOTE — ED Notes (Signed)
Pt c/o headache that started 2 weeks ago, gradually gotten worse-- has not tried any OTC meds . Also c/o "knots on my head" pt has three areas of alopecia that are tender on top of head.

## 2020-08-06 ENCOUNTER — Encounter (HOSPITAL_COMMUNITY): Payer: Self-pay

## 2020-08-06 ENCOUNTER — Ambulatory Visit (HOSPITAL_COMMUNITY)
Admission: EM | Admit: 2020-08-06 | Discharge: 2020-08-06 | Disposition: A | Discharge status: Home / Self Care | Payer: Managed Care, Other (non HMO) | Attending: Emergency Medicine | Admitting: Emergency Medicine

## 2020-08-06 DIAGNOSIS — M109 Gout, unspecified: Secondary | ICD-10-CM

## 2020-08-06 DIAGNOSIS — R030 Elevated blood-pressure reading, without diagnosis of hypertension: Secondary | ICD-10-CM

## 2020-08-06 MED ORDER — COLCHICINE 0.6 MG PO TABS: ORAL_TABLET | ORAL | 0 refills | Status: DC

## 2020-08-06 NOTE — ED Provider Notes (Signed)
[facility]    CSN: [id number] Arrival date & time: 08/06/20  8295      History   Chief Complaint Chief Complaint  Patient presents with  . Foot Pain    HPI Hector Manning is a 55 y.o. male.   Patient presents with 3-day history of pain, redness, swelling in his right great toe.  He denies falls or injury.  He denies fever, numbness, weakness, paresthesias, open wounds, or other symptoms.  No treatments attempted at home.  He denies pertinent medical history.  The history is provided by the patient.    History reviewed. No pertinent past medical history.  There are no problems to display for this patient.   Past Surgical History:  Procedure Laterality Date  . ABDOMINAL SURGERY         Home Medications    Prior to Admission medications   Medication Sig Start Date End Date Taking? Authorizing Provider  colchicine 0.6 MG tablet Take 2 tablets by mouth once.  Then take 1 tablet one hour later once. 08/06/20  Yes Mickie Bail, NP  cyclobenzaprine (FLEXERIL) 10 MG tablet Take 1 tablet (10 mg total) by mouth 2 (two) times daily as needed for muscle spasms. Patient not taking: Reported on 06/15/2015 05/04/15   Fayrene Helper, PA-C  griseofulvin (GRIFULVIN V) 500 MG tablet Take 1 tablet (500 mg total) by mouth daily. 06/15/15   Sam, Ace Gins, PA-C  ibuprofen (ADVIL,MOTRIN) 800 MG tablet Take 1 tablet (800 mg total) by mouth every 8 (eight) hours as needed for moderate pain. Patient not taking: Reported on 06/15/2015 05/04/15   Fayrene Helper, PA-C  ibuprofen (ADVIL,MOTRIN) 800 MG tablet Take 1 tablet (800 mg total) by mouth 3 (three) times daily. 06/15/15   Sam, Ace Gins, PA-C  predniSONE (DELTASONE) 20 MG tablet Take 2 tablets (40 mg total) by mouth daily. Take 40 mg by mouth daily for 3 days, then 20mg  by mouth daily for 3 days, then 10mg  daily for 3 days Patient not taking: Reported on 06/15/2015 03/15/13   06/17/2015, PA-C  promethazine (PHENERGAN) 25 MG tablet Take  1 tablet (25 mg total) by mouth every 6 (six) hours as needed. 06/15/15   Sam, Emilia Beck, PA-C  traMADol (ULTRAM) 50 MG tablet Take 1 tablet (50 mg total) by mouth every 6 (six) hours as needed for pain. Patient not taking: Reported on 06/15/2015 03/15/13   06/17/2015, PA-C    Family History History reviewed. No pertinent family history.  Social History Social History   Tobacco Use  . Smoking status: Current Every Day Smoker    Packs/day: 0.50    Types: Cigarettes  . Smokeless tobacco: Never Used  Substance Use Topics  . Alcohol use: Yes    Comment: couple beers a day  . Drug use: No     Allergies   Patient has no known allergies.   Review of Systems Review of Systems  Constitutional: Negative for chills and fever.  HENT: Negative for ear pain and sore throat.   Eyes: Negative for pain and visual disturbance.  Respiratory: Negative for cough and shortness of breath.   Cardiovascular: Negative for chest pain and palpitations.  Gastrointestinal: Negative for abdominal pain and vomiting.  Genitourinary: Negative for dysuria and hematuria.  Musculoskeletal: Positive for arthralgias. Negative for back pain.  Skin: Negative for color change and rash.  Neurological: Negative for syncope, weakness and numbness.  All other systems reviewed and are negative.    Physical  Exam Triage Vital Signs ED Triage Vitals  Enc Vitals Group     BP 08/06/20 1158 (!) 194/118     Pulse Rate 08/06/20 1158 70     Resp --      Temp 08/06/20 1158 98.5 F (36.9 C)     Temp Source 08/06/20 1158 Oral     SpO2 08/06/20 1158 98 %     Weight --      Height --      Head Circumference --      Peak Flow --      Pain Score 08/06/20 1157 10     Pain Loc --      Pain Edu? --      Excl. in GC? --    No data found.  Updated Vital Signs BP (!) 194/118 (BP Location: Right Arm)   Pulse 70   Temp 98.5 F (36.9 C) (Oral)   SpO2 98%   Visual Acuity Right Eye Distance:   Left Eye  Distance:   Bilateral Distance:    Right Eye Near:   Left Eye Near:    Bilateral Near:     Physical Exam Vitals and nursing note reviewed.  Constitutional:      General: He is not in acute distress.    Appearance: He is well-developed and well-nourished.  HENT:     Head: Normocephalic and atraumatic.     Mouth/Throat:     Mouth: Mucous membranes are moist.  Eyes:     Conjunctiva/sclera: Conjunctivae normal.  Cardiovascular:     Rate and Rhythm: Normal rate and regular rhythm.     Heart sounds: Normal heart sounds.  Pulmonary:     Effort: Pulmonary effort is normal. No respiratory distress.     Breath sounds: Normal breath sounds.  Abdominal:     Palpations: Abdomen is soft.     Tenderness: There is no abdominal tenderness.  Musculoskeletal:        General: Swelling and tenderness present. No deformity or edema. Normal range of motion.     Cervical back: Neck supple.       Feet:  Skin:    General: Skin is warm and dry.     Capillary Refill: Capillary refill takes less than 2 seconds.     Findings: Erythema present. No bruising, lesion or rash.  Neurological:     General: No focal deficit present.     Mental Status: He is alert and oriented to person, place, and time.     Sensory: No sensory deficit.     Motor: No weakness.     Gait: Gait normal.  Psychiatric:        Mood and Affect: Mood and affect and mood normal.        Behavior: Behavior normal.      UC Treatments / Results  Labs (all labs ordered are listed, but only abnormal results are displayed) Labs Reviewed - No data to display  EKG   Radiology No results found.  Procedures Procedures (including critical care time)  Medications Ordered in UC Medications - No data to display  Initial Impression / Assessment and Plan / UC Course  I have reviewed the triage vital signs and the nursing notes.  Pertinent labs & imaging results that were available during my care of the patient were reviewed by me  and considered in my medical decision making (see chart for details).   Acute gout of right great toe.  Elevated blood pressure reading.  Treating  with colchicine.  Patient states he does not have a PCP; assistant was requested from [facility] health in establishing a PCP.  Discussed with patient that his blood pressure is elevated today and needs to be rechecked by a PCP in 2 to 4 weeks.  He agrees to this plan of care.   Final Clinical Impressions(s) / UC Diagnoses   Final diagnoses:  Acute gout involving toe of right foot, unspecified cause  Elevated blood pressure reading     Discharge Instructions     Take the colchicine as directed.    Assistance has been requested to help you find a primary care provider.    Your blood pressure is elevated today at 194/118.  Please have this rechecked by your primary care provider in 2-4 weeks.         ED Prescriptions    Medication Sig Dispense Auth. Provider   colchicine 0.6 MG tablet Take 2 tablets by mouth once.  Then take 1 tablet one hour later once. 6 tablet Mickie Bail, NP     PDMP not reviewed this encounter.   Mickie Bail, NP 08/06/20 [phone]

## 2020-08-06 NOTE — Discharge Instructions (Signed)
Take the colchicine as directed.    Assistance has been requested to help you find a primary care provider.    Your blood pressure is elevated today at 194/118.  Please have this rechecked by your primary care provider in 2-4 weeks.

## 2020-08-06 NOTE — ED Triage Notes (Signed)
Pt c/o right foot pain x 3 days. Pt states his foot started swelling yesterday. He states he is not aware how the swelling began or what is causing the foot pain.

## 2021-06-24 ENCOUNTER — Emergency Department (HOSPITAL_COMMUNITY): Payer: BC Managed Care – PPO | Admitting: Anesthesiology

## 2021-06-24 ENCOUNTER — Encounter (HOSPITAL_COMMUNITY)
Admission: EM | Disposition: A | Discharge status: Home / Self Care | Payer: Self-pay | Source: Home / Self Care | Attending: Orthopedic Surgery

## 2021-06-24 ENCOUNTER — Emergency Department (HOSPITAL_COMMUNITY): Payer: BC Managed Care – PPO

## 2021-06-24 ENCOUNTER — Inpatient Hospital Stay (HOSPITAL_COMMUNITY)
Admission: EM | Admit: 2021-06-24 | Discharge: 2021-06-25 | DRG: 906 | Disposition: A | Discharge status: Home / Self Care | Payer: BC Managed Care – PPO | Attending: Orthopedic Surgery | Admitting: Orthopedic Surgery

## 2021-06-24 DIAGNOSIS — F17210 Nicotine dependence, cigarettes, uncomplicated: Secondary | ICD-10-CM | POA: Diagnosis present

## 2021-06-24 DIAGNOSIS — T148XXA Other injury of unspecified body region, initial encounter: Secondary | ICD-10-CM

## 2021-06-24 DIAGNOSIS — Z23 Encounter for immunization: Secondary | ICD-10-CM

## 2021-06-24 DIAGNOSIS — W134XXA Fall from, out of or through window, initial encounter: Secondary | ICD-10-CM | POA: Diagnosis present

## 2021-06-24 DIAGNOSIS — Y906 Blood alcohol level of 120-199 mg/100 ml: Secondary | ICD-10-CM | POA: Diagnosis present

## 2021-06-24 DIAGNOSIS — Z20822 Contact with and (suspected) exposure to covid-19: Secondary | ICD-10-CM | POA: Diagnosis present

## 2021-06-24 DIAGNOSIS — F10129 Alcohol abuse with intoxication, unspecified: Secondary | ICD-10-CM | POA: Diagnosis present

## 2021-06-24 DIAGNOSIS — S55012A Laceration of ulnar artery at forearm level, left arm, initial encounter: Principal | ICD-10-CM | POA: Diagnosis present

## 2021-06-24 DIAGNOSIS — Z9889 Other specified postprocedural states: Secondary | ICD-10-CM

## 2021-06-24 DIAGNOSIS — S61512A Laceration without foreign body of left wrist, initial encounter: Secondary | ICD-10-CM | POA: Diagnosis not present

## 2021-06-24 DIAGNOSIS — Z79899 Other long term (current) drug therapy: Secondary | ICD-10-CM

## 2021-06-24 DIAGNOSIS — S6412XA Injury of median nerve at wrist and hand level of left arm, initial encounter: Secondary | ICD-10-CM | POA: Diagnosis present

## 2021-06-24 DIAGNOSIS — S66128A Laceration of flexor muscle, fascia and tendon of other finger at wrist and hand level, initial encounter: Secondary | ICD-10-CM | POA: Diagnosis present

## 2021-06-24 HISTORY — PX: INCISION AND DRAINAGE WOUND WITH NERVE AND ARTERY REPAIR: SHX5640

## 2021-06-24 LAB — I-STAT CHEM 8, ED
BUN: 12 mg/dL (ref 6–20)
Calcium, Ion: 0.96 mmol/L — ABNORMAL LOW (ref 1.15–1.40)
Chloride: 111 mmol/L (ref 98–111)
Creatinine, Ser: 1.3 mg/dL — ABNORMAL HIGH (ref 0.61–1.24)
Glucose, Bld: 128 mg/dL — ABNORMAL HIGH (ref 70–99)
HCT: 50 % (ref 39.0–52.0)
Hemoglobin: 17 g/dL (ref 13.0–17.0)
Potassium: 4.3 mmol/L (ref 3.5–5.1)
Sodium: 141 mmol/L (ref 135–145)
TCO2: 23 mmol/L (ref 22–32)

## 2021-06-24 LAB — CBC
HCT: 49.2 % (ref 39.0–52.0)
Hemoglobin: 15.3 g/dL (ref 13.0–17.0)
MCH: 29.7 pg (ref 26.0–34.0)
MCHC: 31.1 g/dL (ref 30.0–36.0)
MCV: 95.3 fL (ref 80.0–100.0)
Platelets: 212 10*3/uL (ref 150–400)
RBC: 5.16 MIL/uL (ref 4.22–5.81)
RDW: 13.6 % (ref 11.5–15.5)
WBC: 8.7 10*3/uL (ref 4.0–10.5)
nRBC: 0 % (ref 0.0–0.2)

## 2021-06-24 LAB — COMPREHENSIVE METABOLIC PANEL
ALT: 24 U/L (ref 0–44)
AST: 27 U/L (ref 15–41)
Albumin: 3.2 g/dL — ABNORMAL LOW (ref 3.5–5.0)
Alkaline Phosphatase: 55 U/L (ref 38–126)
Anion gap: 7 (ref 5–15)
BUN: 9 mg/dL (ref 6–20)
CO2: 25 mmol/L (ref 22–32)
Calcium: 8.6 mg/dL — ABNORMAL LOW (ref 8.9–10.3)
Chloride: 108 mmol/L (ref 98–111)
Creatinine, Ser: 1.04 mg/dL (ref 0.61–1.24)
GFR, Estimated: >60 mL/min (ref >60)
Glucose, Bld: 138 mg/dL — ABNORMAL HIGH (ref 70–99)
Potassium: 4.5 mmol/L (ref 3.5–5.1)
Sodium: 140 mmol/L (ref 135–145)
Total Bilirubin: 0.4 mg/dL (ref 0.3–1.2)
Total Protein: 7.3 g/dL (ref 6.5–8.1)

## 2021-06-24 LAB — SAMPLE TO BLOOD BANK

## 2021-06-24 LAB — RESP PANEL BY RT-PCR (FLU A&B, COVID) ARPGX2
Influenza A by PCR: NEGATIVE
Influenza B by PCR: NEGATIVE
SARS Coronavirus 2 by RT PCR: NEGATIVE

## 2021-06-24 LAB — PROTIME-INR
INR: 1 (ref 0.8–1.2)
Prothrombin Time: 13.3 seconds (ref 11.4–15.2)

## 2021-06-24 SURGERY — INCISION AND DRAINAGE WOUND WITH NERVE AND ARTERY REPAIR
Anesthesia: General | Site: Wrist | Laterality: Left

## 2021-06-24 MED ORDER — LACTATED RINGERS IV SOLN: INTRAVENOUS | Status: DC | PRN

## 2021-06-24 MED ORDER — SODIUM CHLORIDE 0.9 % IR SOLN
Status: DC | PRN
  Administered 2021-06-24: 3000 mL

## 2021-06-24 MED ORDER — LIDOCAINE 2% (20 MG/ML) 5 ML SYRINGE
INTRAMUSCULAR | Status: AC
  Filled 2021-06-24: qty 5

## 2021-06-24 MED ORDER — FENTANYL CITRATE (PF) 250 MCG/5ML IJ SOLN
INTRAMUSCULAR | Status: AC
  Filled 2021-06-24: qty 5

## 2021-06-24 MED ORDER — TETANUS-DIPHTH-ACELL PERTUSSIS 5-2.5-18.5 LF-MCG/0.5 IM SUSY
0.5000 mL | PREFILLED_SYRINGE | Freq: Once | INTRAMUSCULAR | Status: AC
  Administered 2021-06-24: 22:00:00 0.5 mL via INTRAMUSCULAR
  Filled 2021-06-24: qty 0.5

## 2021-06-24 MED ORDER — SUCCINYLCHOLINE CHLORIDE 200 MG/10ML IV SOSY
PREFILLED_SYRINGE | INTRAVENOUS | Status: AC
  Filled 2021-06-24: qty 10

## 2021-06-24 MED ORDER — LIDOCAINE HCL (CARDIAC) PF 100 MG/5ML IV SOSY
PREFILLED_SYRINGE | INTRAVENOUS | Status: DC | PRN
  Administered 2021-06-24: 60 mg via INTRAVENOUS

## 2021-06-24 MED ORDER — PHENYLEPHRINE HCL (PRESSORS) 10 MG/ML IV SOLN
INTRAVENOUS | Status: DC | PRN
  Administered 2021-06-24 – 2021-06-25 (×5): 80 ug via INTRAVENOUS

## 2021-06-24 MED ORDER — SUCCINYLCHOLINE 20MG/ML (10ML) SYRINGE FOR MEDFUSION PUMP - OPTIME
INTRAMUSCULAR | Status: DC | PRN
  Administered 2021-06-24: 200 mg via INTRAVENOUS

## 2021-06-24 MED ORDER — PROPOFOL 10 MG/ML IV BOLUS
INTRAVENOUS | Status: DC | PRN
  Administered 2021-06-24: 200 mg via INTRAVENOUS

## 2021-06-24 MED ORDER — PROPOFOL 10 MG/ML IV BOLUS
INTRAVENOUS | Status: AC
  Filled 2021-06-24: qty 20

## 2021-06-24 MED ORDER — FENTANYL CITRATE (PF) 250 MCG/5ML IJ SOLN
INTRAMUSCULAR | Status: DC | PRN
  Administered 2021-06-24 – 2021-06-25 (×3): 50 ug via INTRAVENOUS
  Administered 2021-06-25: 100 ug via INTRAVENOUS

## 2021-06-24 MED ORDER — CEFAZOLIN SODIUM-DEXTROSE 1-4 GM/50ML-% IV SOLN
INTRAVENOUS | Status: DC | PRN
  Administered 2021-06-24: 1 g via INTRAVENOUS

## 2021-06-24 MED ORDER — FENTANYL CITRATE PF 50 MCG/ML IJ SOSY
50.0000 ug | PREFILLED_SYRINGE | Freq: Once | INTRAMUSCULAR | Status: AC
  Administered 2021-06-24: 22:00:00 50 ug via INTRAVENOUS
  Filled 2021-06-24: qty 1

## 2021-06-24 MED ORDER — DEXAMETHASONE SODIUM PHOSPHATE 10 MG/ML IJ SOLN
INTRAMUSCULAR | Status: DC | PRN
  Administered 2021-06-24: 10 mg via INTRAVENOUS

## 2021-06-24 MED ORDER — 0.9 % SODIUM CHLORIDE (POUR BTL) OPTIME
TOPICAL | Status: DC | PRN
  Administered 2021-06-24: 23:00:00 1000 mL

## 2021-06-24 MED ORDER — CEFAZOLIN SODIUM-DEXTROSE 2-4 GM/100ML-% IV SOLN
2.0000 g | Freq: Once | INTRAVENOUS | Status: AC
  Administered 2021-06-24: 22:00:00 2 g via INTRAVENOUS
  Filled 2021-06-24: qty 100

## 2021-06-24 SURGICAL SUPPLY — 61 items
APPLIER CLIP 9.375 SM OPEN (CLIP)
APR CLP SM 9.3 20 MLT OPN (CLIP)
BAG COUNTER SPONGE SURGICOUNT (BAG) ×3 IMPLANT
BAG DECANTER FOR FLEXI CONT (MISCELLANEOUS) ×2 IMPLANT
BAG SPNG CNTER NS LX DISP (BAG) ×1
BAG SURGICOUNT SPONGE COUNTING (BAG) ×1
BNDG CMPR 9X4 STRL LF SNTH (GAUZE/BANDAGES/DRESSINGS) ×1
BNDG ELASTIC 4X5.8 VLCR STR LF (GAUZE/BANDAGES/DRESSINGS) ×2 IMPLANT
BNDG ESMARK 4X9 LF (GAUZE/BANDAGES/DRESSINGS) ×4 IMPLANT
BNDG GAUZE ELAST 4 BULKY (GAUZE/BANDAGES/DRESSINGS) IMPLANT
CLIP APPLIE 9.375 SM OPEN (CLIP) IMPLANT
CORD BIPOLAR FORCEPS 12FT (ELECTRODE) ×4 IMPLANT
COVER SURGICAL LIGHT HANDLE (MISCELLANEOUS) ×4 IMPLANT
CUFF TOURN SGL QUICK 18X4 (TOURNIQUET CUFF) ×4 IMPLANT
CUFF TOURN SGL QUICK 24 (TOURNIQUET CUFF)
CUFF TRNQT CYL 24X4X16.5-23 (TOURNIQUET CUFF) IMPLANT
DRAPE OEC MINIVIEW 54X84 (DRAPES) IMPLANT
DRAPE SURG 17X23 STRL (DRAPES) ×4 IMPLANT
DRSG ADAPTIC 3X8 NADH LF (GAUZE/BANDAGES/DRESSINGS) IMPLANT
GAUZE 4X4 16PLY ~~LOC~~+RFID DBL (SPONGE) ×4 IMPLANT
GAUZE SPONGE 4X4 12PLY STRL (GAUZE/BANDAGES/DRESSINGS) ×2 IMPLANT
GAUZE XEROFORM 5X9 LF (GAUZE/BANDAGES/DRESSINGS) ×4 IMPLANT
GEL ULTRASOUND 20GR AQUASONIC (MISCELLANEOUS) IMPLANT
GLOVE SRG 8 PF TXTR STRL LF DI (GLOVE) IMPLANT
GLOVE SURG ORTHO LTX SZ8 (GLOVE) ×2 IMPLANT
GLOVE SURG UNDER POLY LF SZ7.5 (GLOVE) ×2 IMPLANT
GLOVE SURG UNDER POLY LF SZ8 (GLOVE) ×3
GLOVE SURG UNDER POLY LF SZ8.5 (GLOVE) ×2 IMPLANT
GOWN STRL REUS W/ TWL LRG LVL3 (GOWN DISPOSABLE) ×4 IMPLANT
GOWN STRL REUS W/ TWL XL LVL3 (GOWN DISPOSABLE) ×2 IMPLANT
GOWN STRL REUS W/TWL LRG LVL3 (GOWN DISPOSABLE) ×6
GOWN STRL REUS W/TWL XL LVL3 (GOWN DISPOSABLE) ×3
KIT BASIN OR (CUSTOM PROCEDURE TRAY) ×4 IMPLANT
KIT TURNOVER KIT B (KITS) ×4 IMPLANT
MANIFOLD NEPTUNE II (INSTRUMENTS) ×4 IMPLANT
NDL HYPO 25GX1X1/2 BEV (NEEDLE) IMPLANT
NEEDLE HYPO 25GX1X1/2 BEV (NEEDLE) IMPLANT
NS IRRIG 1000ML POUR BTL (IV SOLUTION) ×4 IMPLANT
PACK ORTHO EXTREMITY (CUSTOM PROCEDURE TRAY) ×4 IMPLANT
PAD ARMBOARD 7.5X6 YLW CONV (MISCELLANEOUS) ×8 IMPLANT
PAD CAST 4YDX4 CTTN HI CHSV (CAST SUPPLIES) IMPLANT
PADDING CAST ABS 4INX4YD NS (CAST SUPPLIES) ×2
PADDING CAST ABS COTTON 4X4 ST (CAST SUPPLIES) IMPLANT
PADDING CAST COTTON 4X4 STRL (CAST SUPPLIES) ×6
SET CYSTO W/LG BORE CLAMP LF (SET/KITS/TRAYS/PACK) ×2 IMPLANT
SLING ARM FOAM STRAP LRG (SOFTGOODS) ×2 IMPLANT
SOAP 2 % CHG 4 OZ (WOUND CARE) ×4 IMPLANT
SPEAR EYE SURG WECK-CEL (MISCELLANEOUS) ×2 IMPLANT
SPLINT PLASTER CAST XFAST 4X15 (CAST SUPPLIES) IMPLANT
SPLINT PLASTER XTRA FAST SET 4 (CAST SUPPLIES) ×2
SPONGE T-LAP 18X18 ~~LOC~~+RFID (SPONGE) ×2 IMPLANT
SUT CHROMIC 3 0 PS 2 (SUTURE) ×2 IMPLANT
SUT CHROMIC 4 0 PS 2 18 (SUTURE) ×4 IMPLANT
SUT ETHIBOND 3 0 SH 1 (SUTURE) ×12 IMPLANT
SUT ETHILON 8 0 BV130 4 (SUTURE) ×6 IMPLANT
TOWEL GREEN STERILE (TOWEL DISPOSABLE) ×4 IMPLANT
TOWEL GREEN STERILE FF (TOWEL DISPOSABLE) ×4 IMPLANT
TUBE CONNECTING 12'X1/4 (SUCTIONS) ×1
TUBE CONNECTING 12X1/4 (SUCTIONS) ×1 IMPLANT
UNDERPAD 30X36 HEAVY ABSORB (UNDERPADS AND DIAPERS) ×4 IMPLANT
WATER STERILE IRR 1000ML POUR (IV SOLUTION) ×4 IMPLANT

## 2021-06-24 NOTE — ED Notes (Signed)
Heavy bleeding continued with tourniquet and dressing

## 2021-06-24 NOTE — ED Notes (Addendum)
Pt transported to OR at this time with Autumn D RN at this time

## 2021-06-24 NOTE — ED Notes (Signed)
BIB [facility] from hotel. Pt admits to ETOH use. Pt dropped wallet was bending down to pick it up went through glass window. Deep lacertation to lt wrist. Able to move fingers when asked. Tourniquet 1 placed at 2100 tourniquet 2 placed at 2115. Venous bleeding. Bleeding not uncontrolled at this time
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
## 2021-06-24 NOTE — ED Notes (Signed)
Compression dressing applied at this time

## 2021-06-24 NOTE — ED Notes (Signed)
Tourniquet 1 removed

## 2021-06-24 NOTE — ED Notes (Signed)
Advised by provider pt can go to  Or per Dr. Yehuda Budd
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
## 2021-06-24 NOTE — ED Notes (Signed)
Dr. Spears at bedside. 

## 2021-06-24 NOTE — ED Notes (Signed)
Delay in vs due to attempting access at this time

## 2021-06-24 NOTE — ED Notes (Signed)
Tourniquet placed at this time

## 2021-06-24 NOTE — ED Notes (Signed)
Both tourniquets removed

## 2021-06-24 NOTE — Anesthesia Procedure Notes (Signed)
Procedure Name: Intubation Date/Time: 06/24/2021 11:02 PM Performed by: Molli Hazard, CRNA Pre-anesthesia Checklist: Patient identified, Emergency Drugs available, Suction available and Patient being monitored Patient Re-evaluated:Patient Re-evaluated prior to induction Oxygen Delivery Method: Circle system utilized Preoxygenation: Pre-oxygenation with 100% oxygen Induction Type: IV induction, Rapid sequence and Cricoid Pressure applied Laryngoscope Size: Glidescope and 4 Grade View: Grade I Tube type: Oral Tube size: 7.5 mm Number of attempts: 1 Airway Equipment and Method: Stylet Placement Confirmation: ETT inserted through vocal cords under direct vision, positive ETCO2 and breath sounds checked- equal and bilateral Secured at: 25 cm Tube secured with: Tape Dental Injury: Teeth and Oropharynx as per pre-operative assessment

## 2021-06-24 NOTE — ED Notes (Signed)
Pneumatic tourniquet applied at this time

## 2021-06-24 NOTE — ED Notes (Signed)
Tourniquet 1 applied at this time

## 2021-06-24 NOTE — ED Notes (Signed)
Radiology at bedside

## 2021-06-25 ENCOUNTER — Encounter (HOSPITAL_COMMUNITY): Payer: Self-pay | Admitting: Orthopedic Surgery

## 2021-06-25 ENCOUNTER — Other Ambulatory Visit: Payer: Self-pay

## 2021-06-25 DIAGNOSIS — S55012A Laceration of ulnar artery at forearm level, left arm, initial encounter: Secondary | ICD-10-CM | POA: Diagnosis present

## 2021-06-25 DIAGNOSIS — F17210 Nicotine dependence, cigarettes, uncomplicated: Secondary | ICD-10-CM | POA: Diagnosis present

## 2021-06-25 DIAGNOSIS — W134XXA Fall from, out of or through window, initial encounter: Secondary | ICD-10-CM | POA: Diagnosis present

## 2021-06-25 DIAGNOSIS — Z20822 Contact with and (suspected) exposure to covid-19: Secondary | ICD-10-CM | POA: Diagnosis present

## 2021-06-25 DIAGNOSIS — F10129 Alcohol abuse with intoxication, unspecified: Secondary | ICD-10-CM | POA: Diagnosis present

## 2021-06-25 DIAGNOSIS — S6412XA Injury of median nerve at wrist and hand level of left arm, initial encounter: Secondary | ICD-10-CM | POA: Diagnosis present

## 2021-06-25 DIAGNOSIS — Z23 Encounter for immunization: Secondary | ICD-10-CM | POA: Diagnosis not present

## 2021-06-25 DIAGNOSIS — S61512A Laceration without foreign body of left wrist, initial encounter: Secondary | ICD-10-CM | POA: Diagnosis present

## 2021-06-25 DIAGNOSIS — Y906 Blood alcohol level of 120-199 mg/100 ml: Secondary | ICD-10-CM | POA: Diagnosis present

## 2021-06-25 DIAGNOSIS — Z9889 Other specified postprocedural states: Secondary | ICD-10-CM

## 2021-06-25 DIAGNOSIS — S66128A Laceration of flexor muscle, fascia and tendon of other finger at wrist and hand level, initial encounter: Secondary | ICD-10-CM | POA: Diagnosis present

## 2021-06-25 DIAGNOSIS — Z79899 Other long term (current) drug therapy: Secondary | ICD-10-CM | POA: Diagnosis not present

## 2021-06-25 LAB — CBC
HCT: 34 % — ABNORMAL LOW (ref 39.0–52.0)
Hemoglobin: 11.4 g/dL — ABNORMAL LOW (ref 13.0–17.0)
MCH: 30.7 pg (ref 26.0–34.0)
MCHC: 33.5 g/dL (ref 30.0–36.0)
MCV: 91.6 fL (ref 80.0–100.0)
Platelets: 185 10*3/uL (ref 150–400)
RBC: 3.71 MIL/uL — ABNORMAL LOW (ref 4.22–5.81)
RDW: 13.6 % (ref 11.5–15.5)
WBC: 11.8 10*3/uL — ABNORMAL HIGH (ref 4.0–10.5)
nRBC: 0 % (ref 0.0–0.2)

## 2021-06-25 LAB — LACTIC ACID, PLASMA
Lactic Acid, Venous: 5.2 mmol/L (ref 0.5–1.9)
Lactic Acid, Venous: 2.5 mmol/L (ref 0.5–1.9)

## 2021-06-25 LAB — BASIC METABOLIC PANEL
Anion gap: 10 (ref 5–15)
BUN: 9 mg/dL (ref 6–20)
CO2: 25 mmol/L (ref 22–32)
Calcium: 8.6 mg/dL — ABNORMAL LOW (ref 8.9–10.3)
Chloride: 103 mmol/L (ref 98–111)
Creatinine, Ser: 1.15 mg/dL (ref 0.61–1.24)
GFR, Estimated: >60 mL/min (ref >60)
Glucose, Bld: 147 mg/dL — ABNORMAL HIGH (ref 70–99)
Potassium: 4.2 mmol/L (ref 3.5–5.1)
Sodium: 138 mmol/L (ref 135–145)

## 2021-06-25 LAB — ETHANOL: Alcohol, Ethyl (B): 123 mg/dL — ABNORMAL HIGH (ref <10)

## 2021-06-25 MED ORDER — MORPHINE SULFATE (PF) 4 MG/ML IV SOLN
4.0000 mg | INTRAVENOUS | Status: DC | PRN
  Administered 2021-06-25: 15:00:00 4 mg via INTRAVENOUS
  Filled 2021-06-25: qty 1

## 2021-06-25 MED ORDER — OXYCODONE HCL 5 MG/5ML PO SOLN: 5.0000 mg | Freq: Once | ORAL | Status: DC | PRN

## 2021-06-25 MED ORDER — DEXAMETHASONE SODIUM PHOSPHATE 10 MG/ML IJ SOLN
INTRAMUSCULAR | Status: AC
  Filled 2021-06-25: qty 1

## 2021-06-25 MED ORDER — ONDANSETRON HCL 4 MG/2ML IJ SOLN
INTRAMUSCULAR | Status: DC | PRN
  Administered 2021-06-25: 4 mg via INTRAVENOUS

## 2021-06-25 MED ORDER — HYDROMORPHONE HCL 1 MG/ML IJ SOLN: 0.2500 mg | INTRAMUSCULAR | Status: DC | PRN

## 2021-06-25 MED ORDER — MEPERIDINE HCL 25 MG/ML IJ SOLN: 6.2500 mg | INTRAMUSCULAR | Status: DC | PRN

## 2021-06-25 MED ORDER — MIDAZOLAM HCL 2 MG/2ML IJ SOLN: 0.5000 mg | Freq: Once | INTRAMUSCULAR | Status: DC | PRN

## 2021-06-25 MED ORDER — ACETAMINOPHEN 10 MG/ML IV SOLN
1000.0000 mg | Freq: Once | INTRAVENOUS | Status: AC
  Administered 2021-06-25: 03:00:00 1000 mg via INTRAVENOUS

## 2021-06-25 MED ORDER — ACETAMINOPHEN 10 MG/ML IV SOLN
INTRAVENOUS | Status: AC
  Filled 2021-06-25: qty 100

## 2021-06-25 MED ORDER — LABETALOL HCL 5 MG/ML IV SOLN
5.0000 mg | INTRAVENOUS | Status: DC | PRN
  Administered 2021-06-25 (×2): 5 mg via INTRAVENOUS

## 2021-06-25 MED ORDER — OXYCODONE HCL 5 MG PO TABS
5.0000 mg | ORAL_TABLET | ORAL | Status: DC | PRN
  Administered 2021-06-25: 09:00:00 5 mg via ORAL
  Filled 2021-06-25: qty 1

## 2021-06-25 MED ORDER — OXYCODONE HCL 5 MG PO TABS: 5.0000 mg | ORAL_TABLET | Freq: Once | ORAL | Status: DC | PRN

## 2021-06-25 MED ORDER — ONDANSETRON HCL 4 MG/2ML IJ SOLN
INTRAMUSCULAR | Status: AC
  Filled 2021-06-25: qty 2

## 2021-06-25 MED ORDER — LABETALOL HCL 5 MG/ML IV SOLN
INTRAVENOUS | Status: AC
  Filled 2021-06-25: qty 4

## 2021-06-25 MED ORDER — OXYCODONE-ACETAMINOPHEN 5-325 MG PO TABS
1.0000 | ORAL_TABLET | Freq: Four times a day (QID) | ORAL | 0 refills | Status: AC | PRN
Start: 2021-06-25 — End: 2021-06-30

## 2021-06-25 MED ORDER — PROMETHAZINE HCL 25 MG/ML IJ SOLN: 6.2500 mg | INTRAMUSCULAR | Status: DC | PRN
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
## 2021-06-25 NOTE — Progress Notes (Signed)
Postop day 1 doing well with pain control and patient comfortable and ready for discharge.  He has ambulated and urinated.  He denies shortness of breath or chest pain.  He does not feel lightheaded and is alert and oriented x4.  He is appreciative of his care and will follow up next week for a evaluation.  Patient should remain nonweightbearing to the left upper extremity and maintain the splint in place until follow-up visit.  He should elevate and can range his fingers as tolerated.  He should remain nonweightbearing to the left upper extremity.  He has been giving a prescription for Percocet which can be taken for the next 3 days and then should transition off of the Percocet onto over-the-counter Tylenol only.  Okay for discharge home.  Follow-up in 1 to 2 weeks in office.  Mathis Dad, MD Orthopaedic Hand Surgeon [facility] Office number: [phone] [address], [address] [place], [place] [address]

## 2021-06-25 NOTE — Brief Op Note (Signed)
06/24/2021 - 06/25/2021  1:50 AM  PATIENT:  Hector Manning  55 y.o. male  PRE-OPERATIVE DIAGNOSIS:  COMPLEX LEFT WRIST LACERATION  POST-OPERATIVE DIAGNOSIS:  COMPLEX LEFT WRIST LACERATION  PROCEDURE:  Procedure(s): IlEFT WRIST VOLVAR LACERATION WOUND EXPLORATION. NCISION AND DRAINAGE WOUND WITH NERVE TIMES ONE, AND TENDON TIMES SIX, ARTERY TIMES ONE  REPAIR (Left)  SURGEON:  Surgeon(s) and Role:    Gomez Cleverly, MD - Primary  PHYSICIAN ASSISTANT:   ASSISTANTS: none   ANESTHESIA:   general  EBL:  50 cc  BLOOD ADMINISTERED:none  DRAINS: none   LOCAL MEDICATIONS USED:  NONE  SPECIMEN:  No Specimen  DISPOSITION OF SPECIMEN:  N/A  COUNTS:  YES  TOURNIQUET:   Total Tourniquet Time Documented: Upper Arm (Left) - 106 minutes Upper Arm (Left) - 5 minutes Total: Upper Arm (Left) - 111 minutes   DICTATION: .Reubin Milan Dictation  PLAN OF CARE: Admit to inpatient   PATIENT DISPOSITION:  PACU - hemodynamically stable.   Delay start of Pharmacological VTE agent (>24hrs) due to surgical blood loss or risk of bleeding: no

## 2021-06-25 NOTE — Plan of Care (Signed)

## 2021-06-25 NOTE — Transfer of Care (Signed)
Immediate Anesthesia Transfer of Care Note  Patient: Hector Manning  Procedure(s) Performed: LEFT WRIST VOLVAR LACERATION WOUND EXPLORATION. INCISION AND DRAINAGE WOUND WITH NERVE TIMES ONE, AND TENDON TIMES SIX, ARTERY TIMES ONE  REPAIR (Left: Wrist)  Patient Location: PACU  Anesthesia Type:General  Level of Consciousness: awake and drowsy  Airway & Oxygen Therapy: Patient Spontanous Breathing  Post-op Assessment: Report given to RN and Post -op Vital signs reviewed and stable  Post vital signs: Reviewed and stable  Last Vitals:  Vitals Value Taken Time  BP 153/97 06/25/21 0156  Temp    Pulse 101 06/25/21 0157  Resp 28 06/25/21 0157  SpO2 96 % 06/25/21 0157  Vitals shown include unvalidated device data.  Last Pain:  Vitals:   06/24/21 2237  TempSrc:   PainSc: 10-Worst pain ever         Complications: No notable events documented.
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
## 2021-06-25 NOTE — Progress Notes (Signed)
Orthopedic Tech Progress Note Patient Details:  Hector Manning 1966/01/31 [id number]  Patient ID: Hector Manning, male   DOB: 03/08/1966, 55 y.o.   MRN: [id number] Attended trauma page Trinna Post 06/25/2021, 12:09 AM

## 2021-06-25 NOTE — Anesthesia Postprocedure Evaluation (Signed)
Anesthesia Post Note  Patient: Vernell Morgans  Procedure(s) Performed: LEFT WRIST VOLVAR LACERATION WOUND EXPLORATION. INCISION AND DRAINAGE WOUND WITH NERVE TIMES ONE, AND TENDON TIMES SIX, ARTERY TIMES ONE  REPAIR (Left: Wrist)     Patient location during evaluation: PACU Anesthesia Type: General Level of consciousness: sedated, patient cooperative and oriented Pain management: pain level controlled Vital Signs Assessment: post-procedure vital signs reviewed and stable Respiratory status: spontaneous breathing, nonlabored ventilation, respiratory function stable and patient connected to nasal cannula oxygen Cardiovascular status: blood pressure returned to baseline and stable Postop Assessment: no apparent nausea or vomiting Anesthetic complications: no   No notable events documented.  Last Vitals:  Vitals:   06/25/21 0200 06/25/21 0215  BP: (!) 153/97 (!) 163/106  Pulse: 99 (!) 102  Resp: 18 18  Temp: (!) 36 C   SpO2: 96% 96%    Last Pain:  Vitals:   06/25/21 0200  TempSrc:   PainSc: Asleep                 Kayceon Oki,E. Davie Claud

## 2021-06-25 NOTE — Discharge Instructions (Signed)
°  Orthopaedic Hand Surgery Discharge Instructions  WEIGHT BEARING STATUS: Non weight bearing on operative extremity  DRESSINGS: Please keep your dressing/splint/cast clean and dry until your follow-up appointment. You may shower by placing a waterproof covering over your dressing/splint/cast. Contact your surgeon if your splint/cast gets wet. It will need to be changed to prevent skin breakdown.  PAIN CONTROL: First line medications for post operative pain control are Tylenol (acetaminophen) and Motrin (ibuprofen) if you are able to take these medications. If you have been prescribed a medication these can be taken as breakthrough pain medications. Please note that some narcotic pain medication have acetaminophen added and you should never consume more than 4,000mg  of acetaminophen in 24 hour period. Also please note that if you are given Toradol (ketoralac) you should not take similar medications simultaneously such as ibuprofen.   ICE/ELEVATION: Ice and elevate your injured extremity as needed. Avoid direct contact of ice with skin.  HOME MEDICATIONS: No changes have been made to your home medications.  FOLLOW UP: You will be called after surgery with an appointment date and time, however if you have not received a phone call within 3 days please call during regular office hours at [phone] to schedule a post operative appointment.  Please Seek Medical Attention if: Call MD for: pain or pressure in chest, jaw, arm, back, neck  Call MD for: temperature greater than 101 F for more than 24 hours  Call MD for: difficulty breathing Call MD for: Incision redness, bleeding, drainage  Call MD for: palpitations or feeling that the heart is racing  Call MD for: increased swelling in arm, leg, ankle, or abdomen  Call MD for: lightheadedness, dizziness, fainting Go to ED or call 911 if: chest pain does not go away after 3 nitroglycerin doses taken 5 min apart  Go to ED or call 911 for: any  uncontrolled bleeding  Go to ED or call 911 if: unable to reach physician  Discharge Medications: Allergies as of 06/25/2021   No Known Allergies      Medication List     TAKE these medications    acetaminophen 500 MG tablet Commonly known as: TYLENOL Take 1,000 mg by mouth every 6 (six) hours as needed for mild pain.   Alka-Seltzer Pls Sinus & Cough 10-5-325 MG Caps Generic drug: DM-Phenylephrine-Acetaminophen Take 2 capsules by mouth every 6 (six) hours as needed (cough / cold).   colchicine 0.6 MG tablet Take 2 tablets by mouth once.  Then take 1 tablet one hour later once.   oxyCODONE-acetaminophen 5-325 MG tablet Commonly known as: Percocet Take 1 tablet by mouth every 6 (six) hours as needed for up to 5 days for severe pain.          Mathis Dad, MD Orthopaedic Hand Surgeon [facility] Office number: [phone] [address], [address] [place], [place] [address]
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
## 2021-06-25 NOTE — Progress Notes (Signed)
Pt education taught discharge teaching, both IV's removed and belongings collected
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
## 2021-06-29 NOTE — Discharge Summary (Signed)
Physician Discharge Summary  Patient ID: Hector Manning MRN: [id number] DOB/AGE: December 23, 1965 55 y.o.  Admit date: 06/24/2021 Discharge date: 06/25/2021  Admission Diagnoses: COMPLEX LEFT WRIST LACERATION History reviewed. No pertinent past medical history.  Discharge Diagnoses:  Principal Problem:   Status post surgery   Surgeries: Procedure(s): LEFT WRIST VOLAR LACERATION WOUND EXPLORATION. INCISION AND DRAINAGE WOUND WITH NERVE TIMES ONE, AND TENDON TIMES SIX, ARTERY TIMES ONE  REPAIR on 06/24/2021 - 06/25/2021    Consultants:   Discharged Condition: Improved  Hospital Course: Hector Manning is an 55 y.o. male who was admitted 06/24/2021 with a chief complaint of  Chief Complaint  Patient presents with   Fall   Laceration  , and found to have a diagnosis of COMPLEX LEFT WRIST LACERATION.  They were brought to the operating room on 06/24/2021 - 06/25/2021 and underwent Procedure(s): LEFT WRIST VOLAR LACERATION WOUND EXPLORATION. INCISION AND DRAINAGE WOUND WITH NERVE TIMES ONE, AND TENDON TIMES SIX, ARTERY TIMES ONE  REPAIR.    They were given perioperative antibiotics:  Anti-infectives (From admission, onward)    Start     Dose/Rate Route Frequency Ordered Stop   06/24/21 2145  ceFAZolin (ANCEF) IVPB 2g/100 mL premix        2 g 200 mL/hr over 30 Minutes Intravenous  Once 06/24/21 2139 06/24/21 2206     .  They were given sequential compression devices, early ambulation for DVT prophylaxis.  Recent vital signs: No data found..  Recent laboratory studies: No results found.  Discharge Medications:   Allergies as of 06/25/2021   No Known Allergies      Medication List     TAKE these medications    acetaminophen 500 MG tablet Commonly known as: TYLENOL Take 1,000 mg by mouth every 6 (six) hours as needed for mild pain.   Alka-Seltzer Pls Sinus & Cough 10-5-325 MG Caps Generic drug: DM-Phenylephrine-Acetaminophen Take 2 capsules by mouth every 6 (six) hours as  needed (cough / cold).   colchicine 0.6 MG tablet Take 2 tablets by mouth once.  Then take 1 tablet one hour later once.   oxyCODONE-acetaminophen 5-325 MG tablet Commonly known as: Percocet Take 1 tablet by mouth every 6 (six) hours as needed for up to 5 days for severe pain.        Diagnostic Studies: DG Wrist 2 Views Left  Result Date: 06/24/2021 CLINICAL DATA:  Trauma EXAM: LEFT WRIST - 2 VIEW COMPARISON:  None. FINDINGS: Bandaging material and soft tissue swelling at the left wrist. No fracture or radiopaque foreign body. IMPRESSION: Left wrist soft tissue swelling without fracture or radiopaque foreign body. Electronically Signed   By: Deatra Robinson M.D.   On: 06/24/2021 22:00    They benefited maximally from their hospital stay and there were no complications.    Disposition: Discharge disposition: 01-Home or Self Care      Discharge Instructions     Discharge patient   Complete by: As directed    Discharge disposition: 01-Home or Self Care   Discharge patient date: 06/25/2021         Signed: Conlin Brahm 06/29/2021, 3:14 PM

## 2021-09-06 ENCOUNTER — Emergency Department (HOSPITAL_COMMUNITY)
Admission: EM | Admit: 2021-09-06 | Discharge: 2021-09-06 | Disposition: A | Discharge status: Home / Self Care | Payer: BC Managed Care – PPO | Attending: Emergency Medicine | Admitting: Emergency Medicine

## 2021-09-06 ENCOUNTER — Other Ambulatory Visit: Payer: Self-pay

## 2021-09-06 ENCOUNTER — Encounter (HOSPITAL_COMMUNITY): Payer: Self-pay | Admitting: Emergency Medicine

## 2021-09-06 DIAGNOSIS — G8918 Other acute postprocedural pain: Secondary | ICD-10-CM | POA: Insufficient documentation

## 2021-09-06 DIAGNOSIS — S61502A Unspecified open wound of left wrist, initial encounter: Secondary | ICD-10-CM | POA: Diagnosis not present

## 2021-09-06 DIAGNOSIS — X58XXXA Exposure to other specified factors, initial encounter: Secondary | ICD-10-CM | POA: Insufficient documentation

## 2021-09-06 DIAGNOSIS — S6992XA Unspecified injury of left wrist, hand and finger(s), initial encounter: Secondary | ICD-10-CM | POA: Diagnosis present

## 2021-09-06 DIAGNOSIS — F17210 Nicotine dependence, cigarettes, uncomplicated: Secondary | ICD-10-CM | POA: Diagnosis not present

## 2021-09-06 LAB — CBC
HCT: 45.2 % (ref 39.0–52.0)
Hemoglobin: 14.4 g/dL (ref 13.0–17.0)
MCH: 29.8 pg (ref 26.0–34.0)
MCHC: 31.9 g/dL (ref 30.0–36.0)
MCV: 93.6 fL (ref 80.0–100.0)
Platelets: 176 10*3/uL (ref 150–400)
RBC: 4.83 MIL/uL (ref 4.22–5.81)
RDW: 13 % (ref 11.5–15.5)
WBC: 6.1 10*3/uL (ref 4.0–10.5)
nRBC: 0 % (ref 0.0–0.2)

## 2021-09-06 LAB — BASIC METABOLIC PANEL
Anion gap: 11 (ref 5–15)
BUN: 16 mg/dL (ref 6–20)
CO2: 23 mmol/L (ref 22–32)
Calcium: 8.9 mg/dL (ref 8.9–10.3)
Chloride: 106 mmol/L (ref 98–111)
Creatinine, Ser: 1.04 mg/dL (ref 0.61–1.24)
GFR, Estimated: >60 mL/min (ref >60)
Glucose, Bld: 100 mg/dL — ABNORMAL HIGH (ref 70–99)
Potassium: 4.2 mmol/L (ref 3.5–5.1)
Sodium: 140 mmol/L (ref 135–145)

## 2021-09-06 MED ORDER — KETOROLAC TROMETHAMINE 30 MG/ML IJ SOLN
15.0000 mg | Freq: Once | INTRAMUSCULAR | Status: AC
  Administered 2021-09-06: 15 mg via INTRAVENOUS
  Filled 2021-09-06: qty 1

## 2021-09-06 NOTE — ED Notes (Signed)
Delay in d/c d/t critical pts in department ?

## 2021-09-06 NOTE — ED Notes (Signed)
ED PA at BS 

## 2021-09-06 NOTE — ED Notes (Addendum)
21 month old: L radial wrist wound 3.5cm, with surrounding redness, swelling and heat. Wound open, scabbed and scant blood noted, no active bleeding. Seen previously for the same. Concerned for infection. ?

## 2021-09-06 NOTE — Discharge Instructions (Signed)
The surgeons looked at your wound and recommend wet-to-dry dressings ?This is likely slow healing wound ?Follow-up with Dr. Yehuda Budd in clinic in 1 week for postop follow-up ?If you develop a fever, pus draining from wound, or any other concerning symptom please return to the ED in the interim for reevaluation ?

## 2021-09-06 NOTE — ED Triage Notes (Signed)
Patient coming from home, complaint of possible left arm infection. Pt states he cut his arm at work a month ago and thinks it is infected. ?
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
## 2022-04-24 ENCOUNTER — Emergency Department (HOSPITAL_COMMUNITY): Payer: Self-pay

## 2022-04-24 ENCOUNTER — Other Ambulatory Visit: Payer: Self-pay

## 2022-04-24 ENCOUNTER — Observation Stay (HOSPITAL_COMMUNITY): Payer: Self-pay

## 2022-04-24 ENCOUNTER — Encounter (HOSPITAL_COMMUNITY): Payer: Self-pay

## 2022-04-24 ENCOUNTER — Inpatient Hospital Stay (HOSPITAL_COMMUNITY)
Admission: EM | Admit: 2022-04-24 | Discharge: 2022-04-29 | DRG: 286 | Disposition: A | Discharge status: Home / Self Care | Payer: Self-pay | Attending: Internal Medicine | Admitting: Internal Medicine

## 2022-04-24 DIAGNOSIS — N1831 Chronic kidney disease, stage 3a: Secondary | ICD-10-CM | POA: Diagnosis present

## 2022-04-24 DIAGNOSIS — F1010 Alcohol abuse, uncomplicated: Secondary | ICD-10-CM | POA: Diagnosis present

## 2022-04-24 DIAGNOSIS — R059 Cough, unspecified: Secondary | ICD-10-CM | POA: Diagnosis present

## 2022-04-24 DIAGNOSIS — I5021 Acute systolic (congestive) heart failure: Secondary | ICD-10-CM | POA: Diagnosis present

## 2022-04-24 DIAGNOSIS — J189 Pneumonia, unspecified organism: Secondary | ICD-10-CM | POA: Diagnosis present

## 2022-04-24 DIAGNOSIS — F17210 Nicotine dependence, cigarettes, uncomplicated: Secondary | ICD-10-CM | POA: Diagnosis present

## 2022-04-24 DIAGNOSIS — Z597 Insufficient social insurance and welfare support: Secondary | ICD-10-CM

## 2022-04-24 DIAGNOSIS — Z8249 Family history of ischemic heart disease and other diseases of the circulatory system: Secondary | ICD-10-CM

## 2022-04-24 DIAGNOSIS — I429 Cardiomyopathy, unspecified: Secondary | ICD-10-CM | POA: Diagnosis present

## 2022-04-24 DIAGNOSIS — Z7984 Long term (current) use of oral hypoglycemic drugs: Secondary | ICD-10-CM

## 2022-04-24 DIAGNOSIS — Z79899 Other long term (current) drug therapy: Secondary | ICD-10-CM

## 2022-04-24 DIAGNOSIS — N179 Acute kidney failure, unspecified: Secondary | ICD-10-CM | POA: Diagnosis present

## 2022-04-24 DIAGNOSIS — I509 Heart failure, unspecified: Secondary | ICD-10-CM

## 2022-04-24 DIAGNOSIS — Z1152 Encounter for screening for COVID-19: Secondary | ICD-10-CM

## 2022-04-24 DIAGNOSIS — I5020 Unspecified systolic (congestive) heart failure: Secondary | ICD-10-CM

## 2022-04-24 DIAGNOSIS — R0603 Acute respiratory distress: Secondary | ICD-10-CM

## 2022-04-24 DIAGNOSIS — J9601 Acute respiratory failure with hypoxia: Secondary | ICD-10-CM | POA: Diagnosis present

## 2022-04-24 DIAGNOSIS — I2510 Atherosclerotic heart disease of native coronary artery without angina pectoris: Secondary | ICD-10-CM | POA: Diagnosis present

## 2022-04-24 DIAGNOSIS — J440 Chronic obstructive pulmonary disease with acute lower respiratory infection: Secondary | ICD-10-CM | POA: Diagnosis present

## 2022-04-24 DIAGNOSIS — Z801 Family history of malignant neoplasm of trachea, bronchus and lung: Secondary | ICD-10-CM

## 2022-04-24 DIAGNOSIS — I130 Hypertensive heart and chronic kidney disease with heart failure and stage 1 through stage 4 chronic kidney disease, or unspecified chronic kidney disease: Principal | ICD-10-CM | POA: Diagnosis present

## 2022-04-24 DIAGNOSIS — D509 Iron deficiency anemia, unspecified: Secondary | ICD-10-CM | POA: Diagnosis present

## 2022-04-24 DIAGNOSIS — Z833 Family history of diabetes mellitus: Secondary | ICD-10-CM

## 2022-04-24 DIAGNOSIS — Z808 Family history of malignant neoplasm of other organs or systems: Secondary | ICD-10-CM

## 2022-04-24 LAB — HEPATIC FUNCTION PANEL
ALT: 12 U/L (ref 0–44)
AST: 17 U/L (ref 15–41)
Albumin: 3.2 g/dL — ABNORMAL LOW (ref 3.5–5.0)
Alkaline Phosphatase: 48 U/L (ref 38–126)
Bilirubin, Direct: 0.2 mg/dL (ref 0.0–0.2)
Indirect Bilirubin: 0.4 mg/dL (ref 0.3–0.9)
Total Bilirubin: 0.6 mg/dL (ref 0.3–1.2)
Total Protein: 7.1 g/dL (ref 6.5–8.1)

## 2022-04-24 LAB — BASIC METABOLIC PANEL
Anion gap: 11 (ref 5–15)
Anion gap: 12 (ref 5–15)
BUN: 16 mg/dL (ref 6–20)
BUN: 16 mg/dL (ref 6–20)
CO2: 19 mmol/L — ABNORMAL LOW (ref 22–32)
CO2: 22 mmol/L (ref 22–32)
Calcium: 9.1 mg/dL (ref 8.9–10.3)
Calcium: 9.1 mg/dL (ref 8.9–10.3)
Chloride: 112 mmol/L — ABNORMAL HIGH (ref 98–111)
Chloride: 107 mmol/L (ref 98–111)
Creatinine, Ser: 1.59 mg/dL — ABNORMAL HIGH (ref 0.61–1.24)
Creatinine, Ser: 1.41 mg/dL — ABNORMAL HIGH (ref 0.61–1.24)
GFR, Estimated: 51 mL/min — ABNORMAL LOW (ref >60)
GFR, Estimated: 59 mL/min — ABNORMAL LOW (ref >60)
Glucose, Bld: 109 mg/dL — ABNORMAL HIGH (ref 70–99)
Glucose, Bld: 159 mg/dL — ABNORMAL HIGH (ref 70–99)
Potassium: 4.1 mmol/L (ref 3.5–5.1)
Potassium: 4.2 mmol/L (ref 3.5–5.1)
Sodium: 142 mmol/L (ref 135–145)
Sodium: 141 mmol/L (ref 135–145)

## 2022-04-24 LAB — I-STAT VENOUS BLOOD GAS, ED
Acid-base deficit: 7 mmol/L — ABNORMAL HIGH (ref 0.0–2.0)
Bicarbonate: 16.9 mmol/L — ABNORMAL LOW (ref 20.0–28.0)
Calcium, Ion: 1.13 mmol/L — ABNORMAL LOW (ref 1.15–1.40)
HCT: 38 % — ABNORMAL LOW (ref 39.0–52.0)
Hemoglobin: 12.9 g/dL — ABNORMAL LOW (ref 13.0–17.0)
O2 Saturation: 99 %
Potassium: 4.1 mmol/L (ref 3.5–5.1)
Sodium: 143 mmol/L (ref 135–145)
TCO2: 18 mmol/L — ABNORMAL LOW (ref 22–32)
pCO2, Ven: 28.7 mmHg — ABNORMAL LOW (ref 44–60)
pH, Ven: 7.378 (ref 7.25–7.43)
pO2, Ven: 144 mmHg — ABNORMAL HIGH (ref 32–45)

## 2022-04-24 LAB — CBC WITH DIFFERENTIAL/PLATELET
Abs Immature Granulocytes: 0.04 10*3/uL (ref 0.00–0.07)
Basophils Absolute: 0 10*3/uL (ref 0.0–0.1)
Basophils Relative: 0 %
Eosinophils Absolute: 0.2 10*3/uL (ref 0.0–0.5)
Eosinophils Relative: 2 %
HCT: 40.1 % (ref 39.0–52.0)
Hemoglobin: 12.4 g/dL — ABNORMAL LOW (ref 13.0–17.0)
Immature Granulocytes: 1 %
Lymphocytes Relative: 20 %
Lymphs Abs: 1.6 10*3/uL (ref 0.7–4.0)
MCH: 28.9 pg (ref 26.0–34.0)
MCHC: 30.9 g/dL (ref 30.0–36.0)
MCV: 93.5 fL (ref 80.0–100.0)
Monocytes Absolute: 0.8 10*3/uL (ref 0.1–1.0)
Monocytes Relative: 10 %
Neutro Abs: 5.5 10*3/uL (ref 1.7–7.7)
Neutrophils Relative %: 67 %
Platelets: 232 10*3/uL (ref 150–400)
RBC: 4.29 MIL/uL (ref 4.22–5.81)
RDW: 13.8 % (ref 11.5–15.5)
WBC: 8.2 10*3/uL (ref 4.0–10.5)
nRBC: 0 % (ref 0.0–0.2)

## 2022-04-24 LAB — BRAIN NATRIURETIC PEPTIDE: B Natriuretic Peptide: 3089.4 pg/mL — ABNORMAL HIGH (ref 0.0–100.0)

## 2022-04-24 LAB — LIPID PANEL
Cholesterol: 126 mg/dL (ref 0–200)
HDL: 47 mg/dL (ref >40)
Total CHOL/HDL Ratio: 2.7 RATIO
Triglycerides: 30 mg/dL (ref <150)
VLDL: 6 mg/dL (ref 0–40)

## 2022-04-24 LAB — HEMOGLOBIN A1C
Hgb A1c MFr Bld: 5.2 % (ref 4.8–5.6)
Mean Plasma Glucose: 102.54 mg/dL

## 2022-04-24 LAB — TROPONIN I (HIGH SENSITIVITY)
Troponin I (High Sensitivity): 38 ng/L — ABNORMAL HIGH (ref <18)
Troponin I (High Sensitivity): 51 ng/L — ABNORMAL HIGH (ref <18)
Troponin I (High Sensitivity): 28 ng/L — ABNORMAL HIGH (ref <18)

## 2022-04-24 LAB — RESP PANEL BY RT-PCR (FLU A&B, COVID) ARPGX2
Influenza A by PCR: NEGATIVE
Influenza B by PCR: NEGATIVE
SARS Coronavirus 2 by RT PCR: NEGATIVE

## 2022-04-24 LAB — PROCALCITONIN: Procalcitonin: <0.1 ng/mL

## 2022-04-24 LAB — HIV ANTIBODY (ROUTINE TESTING W REFLEX): HIV Screen 4th Generation wRfx: NONREACTIVE

## 2022-04-24 LAB — MAGNESIUM: Magnesium: 1.8 mg/dL (ref 1.7–2.4)

## 2022-04-24 MED ORDER — DM-GUAIFENESIN ER 30-600 MG PO TB12
1.0000 | ORAL_TABLET | Freq: Two times a day (BID) | ORAL | Status: AC
  Administered 2022-04-24 – 2022-04-26 (×6): 1 via ORAL
  Filled 2022-04-24 (×9): qty 1

## 2022-04-24 MED ORDER — ACETAMINOPHEN 650 MG RE SUPP: 650.0000 mg | Freq: Four times a day (QID) | RECTAL | Status: DC | PRN

## 2022-04-24 MED ORDER — MAGNESIUM SULFATE 2 GM/50ML IV SOLN
2.0000 g | Freq: Once | INTRAVENOUS | Status: AC
  Administered 2022-04-24: 2 g via INTRAVENOUS
  Filled 2022-04-24: qty 50

## 2022-04-24 MED ORDER — ACETAMINOPHEN 325 MG PO TABS: 650.0000 mg | ORAL_TABLET | Freq: Four times a day (QID) | ORAL | Status: DC | PRN

## 2022-04-24 MED ORDER — PREDNISONE 20 MG PO TABS
40.0000 mg | ORAL_TABLET | Freq: Every day | ORAL | Status: DC
  Administered 2022-04-25: 40 mg via ORAL
  Filled 2022-04-24: qty 2

## 2022-04-24 MED ORDER — LORAZEPAM 2 MG/ML IJ SOLN: 1.0000 mg | INTRAMUSCULAR | Status: AC | PRN

## 2022-04-24 MED ORDER — NICOTINE 14 MG/24HR TD PT24
14.0000 mg | MEDICATED_PATCH | Freq: Every day | TRANSDERMAL | Status: DC
  Administered 2022-04-24 – 2022-04-27 (×4): 14 mg via TRANSDERMAL
  Filled 2022-04-24 (×5): qty 1

## 2022-04-24 MED ORDER — FUROSEMIDE 10 MG/ML IJ SOLN
40.0000 mg | Freq: Once | INTRAMUSCULAR | Status: AC
  Administered 2022-04-24: 40 mg via INTRAVENOUS
  Filled 2022-04-24: qty 4

## 2022-04-24 MED ORDER — IPRATROPIUM-ALBUTEROL 0.5-2.5 (3) MG/3ML IN SOLN
3.0000 mL | Freq: Once | RESPIRATORY_TRACT | Status: AC
  Administered 2022-04-24: 3 mL via RESPIRATORY_TRACT
  Filled 2022-04-24: qty 3

## 2022-04-24 MED ORDER — THIAMINE MONONITRATE 100 MG PO TABS
100.0000 mg | ORAL_TABLET | Freq: Every day | ORAL | Status: DC
  Administered 2022-04-24 – 2022-04-28 (×5): 100 mg via ORAL
  Filled 2022-04-24 (×5): qty 1

## 2022-04-24 MED ORDER — IOHEXOL 350 MG/ML SOLN
50.0000 mL | Freq: Once | INTRAVENOUS | Status: AC | PRN
  Administered 2022-04-24: 50 mL via INTRAVENOUS

## 2022-04-24 MED ORDER — SODIUM CHLORIDE 0.9 % IV SOLN
3.0000 g | Freq: Four times a day (QID) | INTRAVENOUS | Status: DC
  Administered 2022-04-24 – 2022-04-25 (×2): 3 g via INTRAVENOUS
  Filled 2022-04-24 (×2): qty 8

## 2022-04-24 MED ORDER — FOLIC ACID 1 MG PO TABS
1.0000 mg | ORAL_TABLET | Freq: Every day | ORAL | Status: DC
  Administered 2022-04-24 – 2022-04-28 (×5): 1 mg via ORAL
  Filled 2022-04-24 (×5): qty 1

## 2022-04-24 MED ORDER — FUROSEMIDE 10 MG/ML IJ SOLN
80.0000 mg | Freq: Once | INTRAMUSCULAR | Status: AC
  Administered 2022-04-24: 80 mg via INTRAVENOUS
  Filled 2022-04-24: qty 8

## 2022-04-24 MED ORDER — ALBUTEROL SULFATE (2.5 MG/3ML) 0.083% IN NEBU: 2.5000 mg | INHALATION_SOLUTION | Freq: Four times a day (QID) | RESPIRATORY_TRACT | Status: DC | PRN

## 2022-04-24 MED ORDER — SENNA 8.6 MG PO TABS
1.0000 | ORAL_TABLET | Freq: Two times a day (BID) | ORAL | Status: DC
  Administered 2022-04-24 – 2022-04-28 (×7): 8.6 mg via ORAL
  Filled 2022-04-24 (×8): qty 1

## 2022-04-24 MED ORDER — ADULT MULTIVITAMIN W/MINERALS CH
1.0000 | ORAL_TABLET | Freq: Every day | ORAL | Status: DC
  Administered 2022-04-24 – 2022-04-28 (×5): 1 via ORAL
  Filled 2022-04-24 (×5): qty 1

## 2022-04-24 MED ORDER — ENOXAPARIN SODIUM 40 MG/0.4ML IJ SOSY
40.0000 mg | PREFILLED_SYRINGE | Freq: Every day | INTRAMUSCULAR | Status: DC
  Administered 2022-04-24 – 2022-04-28 (×5): 40 mg via SUBCUTANEOUS
  Filled 2022-04-24 (×5): qty 0.4

## 2022-04-24 MED ORDER — SODIUM CHLORIDE 0.9 % IV BOLUS: 1000.0000 mL | Freq: Once | INTRAVENOUS | Status: DC

## 2022-04-24 MED ORDER — IPRATROPIUM-ALBUTEROL 0.5-2.5 (3) MG/3ML IN SOLN
3.0000 mL | Freq: Four times a day (QID) | RESPIRATORY_TRACT | Status: AC
  Administered 2022-04-24 (×2): 3 mL via RESPIRATORY_TRACT
  Filled 2022-04-24 (×2): qty 3

## 2022-04-24 MED ORDER — SODIUM CHLORIDE 0.9 % IV SOLN
1.0000 g | Freq: Once | INTRAVENOUS | Status: AC
  Administered 2022-04-24: 1 g via INTRAVENOUS
  Filled 2022-04-24: qty 10

## 2022-04-24 MED ORDER — ASPIRIN 81 MG PO CHEW
324.0000 mg | CHEWABLE_TABLET | Freq: Once | ORAL | Status: AC
  Administered 2022-04-24: 324 mg via ORAL
  Filled 2022-04-24: qty 4

## 2022-04-24 MED ORDER — SODIUM CHLORIDE 0.9 % IV SOLN
500.0000 mg | Freq: Once | INTRAVENOUS | Status: AC
  Administered 2022-04-24: 500 mg via INTRAVENOUS
  Filled 2022-04-24: qty 5

## 2022-04-24 MED ORDER — LORAZEPAM 1 MG PO TABS
1.0000 mg | ORAL_TABLET | ORAL | Status: AC | PRN
  Administered 2022-04-26: 1 mg via ORAL
  Filled 2022-04-24 (×2): qty 1

## 2022-04-24 NOTE — ED Provider Notes (Signed)
  Physical Exam  BP (!) 137/103   Pulse 94   Temp 97.9 F (36.6 C)   Resp (!) 21   Ht 5\' 11"  (1.803 m)   Wt 108.9 kg   SpO2 97%   BMI 33.47 kg/m   Physical Exam  Procedures  Procedures  ED Course / MDM   Clinical Course as of 04/24/22 0858  Thu Apr 24, 2022  0552 Pt is breathing comfortably on 2L [place], feels his breathing is greatly improved after the nebs. Wheezing has greatly improved on auscultation.  [SG]  [id number] 53 M with productive cough and sob. On new 2L  given duonebs, IV diuresis, and IV antibiotics. Follow up CTA PE. Admit.  [VB]  [id number] Patient CTA negative for PE but evidence of bilateral pleural effusions and left upper lobe consolidation concerning for pneumonia.  He is currently receiving antibiotics and IV diuresis.  Reaching out to unassigned medical team for admission. [VB]  [phone] Discussed case with medicine admitting team will be down to evaluate the patient put orders for admission. [VB]    Clinical Course User Index [SG] Jeanell Sparrow, DO [VB] Elgie Congo, MD   Medical Decision Making Amount and/or Complexity of Data Reviewed Labs: ordered. Radiology: ordered.  Risk OTC drugs. Prescription drug management. Decision regarding hospitalization.          Elgie Congo, MD 04/24/22 [phone]
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
## 2022-04-24 NOTE — ED Notes (Signed)
Dr Gray at bedside  

## 2022-04-24 NOTE — ED Triage Notes (Signed)
PER EMS: pt is from home with c/o SOB x 1 week that has progressively getting worse associated with productive cough of brown sputum. Initial RA sats 98%, wheezing in all lung fields so EMS gave 1 albuterol txt and then a duoneb. Denies any pain.  20g IV LFA where he received 125mg  of solumedrol  HR-115s, BP-170/126, 100% on neb, CBG-133

## 2022-04-24 NOTE — Progress Notes (Signed)
Pharmacy Antibiotic Note  Hector Manning is a 56 y.o. male admitted on 04/24/2022 with aspiration pneumonia.  Pharmacy has been consulted for Unasyn dosing. SCr down to 1.41. Patient received 1x doses of ceftriaxone/azithromycin overnight 10/26.  Plan: Unasyn 3g IV q6h Monitor clinical progress, c/s, renal function F/u de-escalation plan/LOT   Height: 5\' 11"  (180.3 cm) Weight: 108.9 kg (240 lb) IBW/kg (Calculated) : 75.3  Temp (24hrs), Avg:98 F (36.7 C), Min:97.7 F (36.5 C), Max:98.4 F (36.9 C)  Recent Labs  Lab 04/24/22 0140 04/24/22 1211  WBC 8.2  --   CREATININE 1.59* 1.41*    Estimated Creatinine Clearance: 75.1 mL/min (A) (by C-G formula based on SCr of 1.41 mg/dL (H)).    No Known Allergies  Arturo Morton, PharmD, BCPS Please check AMION for all [name] contact numbers Clinical Pharmacist 04/24/2022 4:51 PM

## 2022-04-24 NOTE — Progress Notes (Signed)
Heart Failure Navigator Progress Note  Following this hospitalization to assess for HV TOC readiness.   ECHO ordered Possible CHF vs PNA  [name], BSN, Clinical cytogeneticist Only

## 2022-04-24 NOTE — Hospital Course (Addendum)
Hector Manning is a 56 y.o. with a pertinent PMH of tobacco and alcohol use disorder who presented with progressive dyspnea and is admitted for acute hypoxic respiratory failure secondary to newly diagnosed HFrEF (EF 25-30%).   Acute Hypoxemic Respiratory Failure HFrEF NYHA class I, Stage C HTN Healthcare naive patient presented with progressive dyspnea, lower extremity edema, and productive cough. 20 year smoking history with some benefit from inhalers and nebulizers. Bicarb initially low but normalized. BNP also elevated at 3000 with mild troponin elevation (38-51-28). CXR showed cardiomegaly with pulmonary congestion and mild interstitial edema/infiltrates. CTA chest showed no evidence of PE but trace bilateral pleural effusions and groundglass opacity in the left upper lobe.  Also found to have iron deficiency anemia and normal phosphorus.  He was requiring oxygen and improved with IV diuresis, he did not require supplemental oxygen for the majority of his admission. Blood pressure elevated on admission with systolics up to 017 and diastolics 51-025.  Initial presentation thought to be 2/2 undiagnosed COPD and/or heart failure. He was started on antibiotics in addition to the IV diuresis and DuoNebs.  Antibiotics were stopped after negative Pro-Cal x2.  Echocardiogram showed severely reduced ejection fraction of 25 to 30% with global hypokinesis of the left ventricle.  Heart failure secondary to untreated hypertension.  Cardiology was consulted for ischemic evaluation.  Left/right heart cath on 04/29/2022 without obstructive coronary disease or elevated right heart pressures.  He was started on GDMT with Lasix 40 mg twice daily, losartan 50 mg daily, carvedilol 12.5 mg twice daily, spironolactone 12.5 mg daily, and empagliflozin 10 mg daily.  We will consider switching losartan to Entresto if able to get patient assistance outpatient.  AKI on CKD 3A Patient presented with creatinine of 1.59.  Prior  creatinine of 1.04 in 08/2021.  After diuresis his creatinine dropped to 1.46.  He fluctuated between 1.4 and 1.6 during his admission and is likely his baseline.  Likely secondary to untreated hypertension and heart failure.  Normocytic anemia Iron deficiency anemia Patient presented with a hemoglobin of 12 without any known source of bleeding.  Further work-up showed hypoproliferation with a low reticulocyte index at 1.55.  Iron studies showed iron of 31, saturation of 8, and ferritin of 24.  Smear was unremarkable, folate was normal.  Given 250 mg IV iron and started on ferrous sulfate 325 mg daily.  This may be lowered to 3 times a week outpatient.  His hemoglobin did increase to 13 during his admission.  He will also need a colonoscopy as he has not had one.  Tobacco use disorder Alcohol use disorder Tobacco use of half a pack a day for 20 years.  Alcohol use of 2X 40 ounce beers a day and 25 ounces of liquor on the weekends.  No history of alcohol withdrawal.  Last drink was 2 weeks prior to admission.  He had some tremors but otherwise denied any more symptoms of withdrawal.  CIWA during his admission were consistently 0.  TOC was consulted for alcohol cessation.  He was given nicotine patches as well.  We will continue tobacco and alcohol cessation discussions outpatient.

## 2022-04-24 NOTE — ED Provider Notes (Signed)
[facility] EMERGENCY DEPARTMENT Provider Note   CSN: [id number] Arrival date & time: 04/24/22  0132     History  Chief Complaint  Patient presents with   Shortness of Breath    Hector Manning is a 56 y.o. male.  Patient as above with significant medical history as below, including tobacco use, bronchitis who presents to the ED with complaint of cough/dib. Feeling unwell x8 days, worsened last 24 hours, productive cough w/ brown sputum, worsening DIB last 24 hours. Unrelieved with albuterol inhaler that was started earlier today. Cough worsened with tobacco use, chest tightness also a/w the cough. No fever or chills, no n/v. Denies any known cardiac hx.      History reviewed. No pertinent past medical history.  Past Surgical History:  Procedure Laterality Date   ABDOMINAL SURGERY     INCISION AND DRAINAGE WOUND WITH NERVE AND ARTERY REPAIR Left 06/24/2021   Procedure: LEFT WRIST VOLVAR LACERATION WOUND EXPLORATION. INCISION AND DRAINAGE WOUND WITH NERVE TIMES ONE, AND TENDON TIMES SIX, ARTERY TIMES ONE  REPAIR;  Surgeon: Orene Desanctis, MD;  Location: [facility];  Service: Orthopedics;  Laterality: Left;     The history is provided by the patient. No language interpreter was used.  Shortness of Breath Associated symptoms: chest pain and cough   Associated symptoms: no abdominal pain, no fever, no headaches, no rash and no vomiting        Home Medications Prior to Admission medications   Medication Sig Start Date End Date Taking? Authorizing Provider  acetaminophen (TYLENOL) 500 MG tablet Take 1,000 mg by mouth every 6 (six) hours as needed for mild pain.    [provider]  colchicine 0.6 MG tablet Take 2 tablets by mouth once.  Then take 1 tablet one hour later once. Patient not taking: Reported on 06/25/2021 08/06/20   Sharion Balloon, NP  DM-Phenylephrine-Acetaminophen (ALKA-SELTZER PLS SINUS & COUGH) 10-5-325 MG CAPS Take 2 capsules by mouth every 6  (six) hours as needed (cough / cold).    [provider]      Allergies    Patient has no known allergies.    Review of Systems   Review of Systems  Constitutional:  Negative for chills and fever.  HENT:  Negative for facial swelling and trouble swallowing.   Eyes:  Negative for photophobia and visual disturbance.  Respiratory:  Positive for cough, chest tightness and shortness of breath.   Cardiovascular:  Positive for chest pain. Negative for palpitations.  Gastrointestinal:  Negative for abdominal pain, nausea and vomiting.  Endocrine: Negative for polydipsia and polyuria.  Genitourinary:  Negative for difficulty urinating and hematuria.  Musculoskeletal:  Negative for gait problem and joint swelling.  Skin:  Negative for pallor and rash.  Neurological:  Negative for syncope and headaches.  Psychiatric/Behavioral:  Negative for agitation and confusion.     Physical Exam Updated Vital Signs BP (!) 172/107 (BP Location: Right Arm)   Pulse (!) 115   Temp 97.7 F (36.5 C) (Oral)   Resp (!) 30   Ht 5\' 11"  (1.803 m)   Wt 108.9 kg   SpO2 100%   BMI 33.47 kg/m  Physical Exam Vitals and nursing note reviewed.  Constitutional:      General: He is not in acute distress.    Appearance: He is well-developed.  HENT:     Head: Normocephalic and atraumatic.     Right Ear: External ear normal.     Left Ear:  External ear normal.     Mouth/Throat:     Mouth: Mucous membranes are moist.  Eyes:     General: No scleral icterus. Cardiovascular:     Rate and Rhythm: Regular rhythm. Tachycardia present.     Pulses: Normal pulses.     Heart sounds: Normal heart sounds.  Pulmonary:     Effort: Pulmonary effort is normal. Tachypnea present. No respiratory distress.     Breath sounds: Decreased breath sounds and wheezing present.     Comments: Wheezing L > R Abdominal:     General: Abdomen is flat.     Palpations: Abdomen is soft.     Tenderness: There is no abdominal  tenderness.  Musculoskeletal:        General: Normal range of motion.     Cervical back: Normal range of motion.     Right lower leg: No edema.     Left lower leg: No edema.  Skin:    General: Skin is warm and dry.     Capillary Refill: Capillary refill takes less than 2 seconds.  Neurological:     Mental Status: He is alert and oriented to person, place, and time.  Psychiatric:        Mood and Affect: Mood normal.        Behavior: Behavior normal.     ED Results / Procedures / Treatments   Labs (all labs ordered are listed, but only abnormal results are displayed) Labs Reviewed  BASIC METABOLIC PANEL - Abnormal; Notable for the following components:      Result Value   Chloride 112 (*)    CO2 19 (*)    Glucose, Bld 109 (*)    Creatinine, Ser 1.59 (*)    GFR, Estimated 51 (*)    All other components within normal limits  CBC WITH DIFFERENTIAL/PLATELET - Abnormal; Notable for the following components:   Hemoglobin 12.4 (*)    All other components within normal limits  BRAIN NATRIURETIC PEPTIDE - Abnormal; Notable for the following components:   B Natriuretic Peptide 3,089.4 (*)    All other components within normal limits  I-STAT VENOUS BLOOD GAS, ED - Abnormal; Notable for the following components:   pCO2, Ven 28.7 (*)    pO2, Ven 144 (*)    Bicarbonate 16.9 (*)    TCO2 18 (*)    Acid-base deficit 7.0 (*)    Calcium, Ion 1.13 (*)    HCT 38.0 (*)    Hemoglobin 12.9 (*)    All other components within normal limits  TROPONIN I (HIGH SENSITIVITY) - Abnormal; Notable for the following components:   Troponin I (High Sensitivity) 38 (*)    All other components within normal limits  RESP PANEL BY RT-PCR (FLU A&B, COVID) [id number]  MAGNESIUM    EKG EKG Interpretation  Date/Time:  Thursday April 24 2022 01:41:48 EDT Ventricular Rate:  114 PR Interval:  128 QRS Duration: 89 QT Interval:  352 QTC Calculation: 485 R Axis:   -29 Text Interpretation: Sinus tachycardia  Left atrial enlargement LVH with secondary repolarization abnormality Borderline prolonged QT interval no stemi No significant change was found Confirmed by Tanda Rockers ([id number]) on 04/24/2022 1:53:04 AM  Radiology DG Chest Port 1 View  Result Date: 04/24/2022 CLINICAL DATA:  Shortness of breath. EXAM: PORTABLE CHEST 1 VIEW COMPARISON:  None. FINDINGS: The heart is enlarged the mediastinal contour is within normal limits. The pulmonary vasculature is mildly distended. Interstitial prominence is present bilaterally. No effusion or  pneumothorax. No acute osseous abnormality. IMPRESSION: 1. Cardiomegaly with pulmonary vascular congestion. 2. Mild interstitial prominence bilaterally, possible edema or infiltrate. Electronically Signed   By: Thornell Sartorius M.D.   On: 04/24/2022 02:13    Procedures .Critical Care  Performed by: Sloan Leiter, DO Authorized by: Sloan Leiter, DO   Critical care provider statement:    Critical care time (minutes):  41   Critical care time was exclusive of:  Separately billable procedures and treating other patients   Critical care was necessary to treat or prevent imminent or life-threatening deterioration of the following conditions:  Cardiac failure and respiratory failure   Critical care was time spent personally by me on the following activities:  Development of treatment plan with patient or surrogate, discussions with consultants, evaluation of patient's response to treatment, examination of patient, ordering and review of laboratory studies, ordering and review of radiographic studies, ordering and performing treatments and interventions, pulse oximetry, re-evaluation of patient's condition, review of old charts and obtaining history from patient or surrogate   Care discussed with: admitting provider       Medications Ordered in ED Medications  cefTRIAXone (ROCEPHIN) 1 g in sodium chloride 0.9 % 100 mL IVPB (has no administration in time range)  azithromycin  (ZITHROMAX) 500 mg in sodium chloride 0.9 % 250 mL IVPB (has no administration in time range)  magnesium sulfate IVPB 2 g 50 mL (0 g Intravenous Stopped 04/24/22 0303)  ipratropium-albuterol (DUONEB) 0.5-2.5 (3) MG/3ML nebulizer solution 3 mL (3 mLs Nebulization Given 04/24/22 0310)  furosemide (LASIX) injection 80 mg (80 mg Intravenous Given 04/24/22 0311)  aspirin chewable tablet 324 mg (324 mg Oral Given 04/24/22 0309)    ED Course/ Medical Decision Making/ A&P Clinical Course as of 04/24/22 0731  Thu Apr 24, 2022  0552 Pt is breathing comfortably on 2L [place], feels his breathing is greatly improved after the nebs. Wheezing has greatly improved on auscultation.  [SG]  [phone] 72 M with productive cough and sob. On new 2L [place] given duonebs, IV diuresis, and IV antibiotics. Follow up CTA PE. Admit.  [VB]    Clinical Course User Index [SG] Sloan Leiter, DO [VB] Mardene Sayer, MD                           Medical Decision Making Amount and/or Complexity of Data Reviewed Labs: ordered. Radiology: ordered.  Risk OTC drugs. Prescription drug management. Decision regarding hospitalization.   This patient presents to the ED with chief complaint(s) of cough/dib with pertinent past medical history of tobacco use which further complicates the presenting complaint. The complaint involves an extensive differential diagnosis and also carries with it a high risk of complications and morbidity.    In my evaluation of this patient's dyspnea my DDx includes, but is not limited to, pneumonia, pulmonary embolism, pneumothorax, pulmonary edema, metabolic acidosis, asthma, COPD, cardiac cause, anemia, anxiety, etc.   . Serious etiologies were considered.   The initial plan is to neb and solumedrol given by EMS< will give mgso4, fluids, screening labs xr re-assess   Additional history obtained: Additional history obtained from  na Records reviewed  prior ED visits, prior labs/imaging    Independent labs interpretation:  The following labs were independently interpreted:  VBG w/ pH 7.37, CO2 28, he has tachypnea Trop elev 38 > 51 (favor demand ischemia in setting of resp distress) BNP >3000 Cr 1.59, baseline around 1 per chart  review  Independent visualization of imaging: - I independently visualized the following imaging with scope of interpretation limited to determining acute life threatening conditions related to emergency care: CXR, CTPE, which revealed CXR with infiltrate/edema, CTPE  Cardiac monitoring was reviewed and interpreted by myself which shows sinus tachy  Treatment and Reassessment: Duoneb x2 MgS04 Lasix  ASA Given solumedrol PTA Rocephin/azithro >> improving   Consultation: - Consulted or discussed management/test interpretation w/ external professional: na  Consideration for admission or further workup: Admission was considered    56 yo male here today with DIB. Concern for possible PNA, new onset CHF. He is daily tobacco smoker but no dx COPD or emphysema per pt but does not regularly follow with pcp. He is requiring 2L[place], respiratory status improved with nebs/steroids/mag. Also started on abx for presumed PNA. BNP elevated, no sig LE edema, he has vascular congestion/edema on CXR. Started on lasix. Also pt has AKI. CTPE pending at shift change, anticipate admission once CTPE has resulted, care signed out to incoming EDP at time of shift change. Pt is HDS. He is breathing comfortably on 2LNC, if resp status worsens have low threshold to start NIPPV if needed.     Social Determinants of health: Counseled patient for approximately 3 minutes regarding smoking cessation. Discussed risks of smoking and how they applied and affected their visit here today. Patient not ready to quit at this time, however will follow up with their primary doctor when they are.   CPT code: 37342: intermediate counseling for smoking cessation   Social History    Tobacco Use   Smoking status: Every Day    Packs/day: 0.50    Types: Cigarettes   Smokeless tobacco: Never  Substance Use Topics   Alcohol use: Yes    Comment: couple beers a day   Drug use: No            Final Clinical Impression(s) / ED Diagnoses Final diagnoses:  AKI (acute kidney injury) (HCC)  Community acquired pneumonia, unspecified laterality  Acute congestive heart failure, unspecified heart failure type [facility])    Rx / DC Orders ED Discharge Orders     None         Sloan Leiter, DO 04/24/22 [phone]

## 2022-04-24 NOTE — Progress Notes (Signed)
2D echo attempted, but patient in hall with not rooms available. Will try echo when patient gets to room

## 2022-04-24 NOTE — ED Notes (Signed)
Patient transported to CT 

## 2022-04-25 ENCOUNTER — Observation Stay (HOSPITAL_COMMUNITY): Payer: Self-pay

## 2022-04-25 DIAGNOSIS — N179 Acute kidney failure, unspecified: Principal | ICD-10-CM

## 2022-04-25 DIAGNOSIS — I110 Hypertensive heart disease with heart failure: Secondary | ICD-10-CM

## 2022-04-25 DIAGNOSIS — J189 Pneumonia, unspecified organism: Secondary | ICD-10-CM

## 2022-04-25 DIAGNOSIS — I5021 Acute systolic (congestive) heart failure: Secondary | ICD-10-CM

## 2022-04-25 DIAGNOSIS — I509 Heart failure, unspecified: Secondary | ICD-10-CM

## 2022-04-25 DIAGNOSIS — R0609 Other forms of dyspnea: Secondary | ICD-10-CM

## 2022-04-25 LAB — COMPREHENSIVE METABOLIC PANEL
ALT: 11 U/L (ref 0–44)
AST: 12 U/L — ABNORMAL LOW (ref 15–41)
Albumin: 3.1 g/dL — ABNORMAL LOW (ref 3.5–5.0)
Alkaline Phosphatase: 45 U/L (ref 38–126)
Anion gap: 9 (ref 5–15)
BUN: 19 mg/dL (ref 6–20)
CO2: 24 mmol/L (ref 22–32)
Calcium: 9 mg/dL (ref 8.9–10.3)
Chloride: 107 mmol/L (ref 98–111)
Creatinine, Ser: 1.46 mg/dL — ABNORMAL HIGH (ref 0.61–1.24)
GFR, Estimated: 57 mL/min — ABNORMAL LOW (ref >60)
Glucose, Bld: 94 mg/dL (ref 70–99)
Potassium: 3.4 mmol/L — ABNORMAL LOW (ref 3.5–5.1)
Sodium: 140 mmol/L (ref 135–145)
Total Bilirubin: 0.6 mg/dL (ref 0.3–1.2)
Total Protein: 6.9 g/dL (ref 6.5–8.1)

## 2022-04-25 LAB — PHOSPHORUS: Phosphorus: 3.5 mg/dL (ref 2.5–4.6)

## 2022-04-25 LAB — TECHNOLOGIST SMEAR REVIEW

## 2022-04-25 LAB — RETICULOCYTES
Immature Retic Fract: 27.5 % — ABNORMAL HIGH (ref 2.3–15.9)
RBC.: 4.31 MIL/uL (ref 4.22–5.81)
Retic Count, Absolute: 74.6 10*3/uL (ref 19.0–186.0)
Retic Ct Pct: 1.7 % (ref 0.4–3.1)

## 2022-04-25 LAB — ECHOCARDIOGRAM COMPLETE
AR max vel: 3.16 cm2
AV Area VTI: 3.04 cm2
AV Area mean vel: 3.08 cm2
AV Mean grad: 3 mmHg
AV Peak grad: 5.3 mmHg
Ao pk vel: 1.15 m/s
Area-P 1/2: 5.06 cm2
Calc EF: 25.1 %
Height: 71 in
S' Lateral: 5.6 cm
Single Plane A2C EF: 27.3 %
Single Plane A4C EF: 25.6 %
Weight: 3601.43 oz

## 2022-04-25 LAB — CBC WITH DIFFERENTIAL/PLATELET
Abs Immature Granulocytes: 0.02 10*3/uL (ref 0.00–0.07)
Basophils Absolute: 0 10*3/uL (ref 0.0–0.1)
Basophils Relative: 0 %
Eosinophils Absolute: 0.2 10*3/uL (ref 0.0–0.5)
Eosinophils Relative: 2 %
HCT: 38.9 % — ABNORMAL LOW (ref 39.0–52.0)
Hemoglobin: 12.2 g/dL — ABNORMAL LOW (ref 13.0–17.0)
Immature Granulocytes: 0 %
Lymphocytes Relative: 22 %
Lymphs Abs: 2.1 10*3/uL (ref 0.7–4.0)
MCH: 28.1 pg (ref 26.0–34.0)
MCHC: 31.4 g/dL (ref 30.0–36.0)
MCV: 89.6 fL (ref 80.0–100.0)
Monocytes Absolute: 0.8 10*3/uL (ref 0.1–1.0)
Monocytes Relative: 8 %
Neutro Abs: 6.7 10*3/uL (ref 1.7–7.7)
Neutrophils Relative %: 68 %
Platelets: 232 10*3/uL (ref 150–400)
RBC: 4.34 MIL/uL (ref 4.22–5.81)
RDW: 13.9 % (ref 11.5–15.5)
WBC: 9.7 10*3/uL (ref 4.0–10.5)
nRBC: 0 % (ref 0.0–0.2)

## 2022-04-25 LAB — CBC
HCT: 37.2 % — ABNORMAL LOW (ref 39.0–52.0)
Hemoglobin: 12 g/dL — ABNORMAL LOW (ref 13.0–17.0)
MCH: 28.6 pg (ref 26.0–34.0)
MCHC: 32.3 g/dL (ref 30.0–36.0)
MCV: 88.6 fL (ref 80.0–100.0)
Platelets: 217 10*3/uL (ref 150–400)
RBC: 4.2 MIL/uL — ABNORMAL LOW (ref 4.22–5.81)
RDW: 14 % (ref 11.5–15.5)
WBC: 10 10*3/uL (ref 4.0–10.5)
nRBC: 0 % (ref 0.0–0.2)

## 2022-04-25 LAB — IRON AND TIBC
Iron: 31 ug/dL — ABNORMAL LOW (ref 45–182)
Saturation Ratios: 8 % — ABNORMAL LOW (ref 17.9–39.5)
TIBC: 395 ug/dL (ref 250–450)
UIBC: 364 ug/dL

## 2022-04-25 LAB — BASIC METABOLIC PANEL
Anion gap: 10 (ref 5–15)
BUN: 18 mg/dL (ref 6–20)
CO2: 25 mmol/L (ref 22–32)
Calcium: 9.2 mg/dL (ref 8.9–10.3)
Chloride: 106 mmol/L (ref 98–111)
Creatinine, Ser: 1.43 mg/dL — ABNORMAL HIGH (ref 0.61–1.24)
GFR, Estimated: 58 mL/min — ABNORMAL LOW (ref >60)
Glucose, Bld: 125 mg/dL — ABNORMAL HIGH (ref 70–99)
Potassium: 3.6 mmol/L (ref 3.5–5.1)
Sodium: 141 mmol/L (ref 135–145)

## 2022-04-25 LAB — FERRITIN: Ferritin: 27 ng/mL (ref 24–336)

## 2022-04-25 LAB — FOLATE: Folate: 9.5 ng/mL (ref >5.9)

## 2022-04-25 LAB — PROCALCITONIN: Procalcitonin: <0.1 ng/mL

## 2022-04-25 LAB — VITAMIN B12: Vitamin B-12: 98 pg/mL — ABNORMAL LOW (ref 180–914)

## 2022-04-25 LAB — MAGNESIUM: Magnesium: 1.9 mg/dL (ref 1.7–2.4)

## 2022-04-25 MED ORDER — SODIUM CHLORIDE 0.9 % IV SOLN
250.0000 mg | Freq: Once | INTRAVENOUS | Status: AC
  Administered 2022-04-25: 250 mg via INTRAVENOUS
  Filled 2022-04-25: qty 20

## 2022-04-25 MED ORDER — POTASSIUM CHLORIDE CRYS ER 20 MEQ PO TBCR
40.0000 meq | EXTENDED_RELEASE_TABLET | Freq: Two times a day (BID) | ORAL | Status: DC
  Administered 2022-04-25: 40 meq via ORAL
  Filled 2022-04-25: qty 2

## 2022-04-25 MED ORDER — POTASSIUM CHLORIDE CRYS ER 20 MEQ PO TBCR
40.0000 meq | EXTENDED_RELEASE_TABLET | Freq: Two times a day (BID) | ORAL | Status: AC
  Administered 2022-04-25: 40 meq via ORAL
  Filled 2022-04-25: qty 2

## 2022-04-25 MED ORDER — LOSARTAN POTASSIUM 25 MG PO TABS
25.0000 mg | ORAL_TABLET | Freq: Every day | ORAL | Status: DC
  Administered 2022-04-26: 25 mg via ORAL
  Filled 2022-04-25: qty 1

## 2022-04-25 MED ORDER — AMLODIPINE BESYLATE 5 MG PO TABS: 5.0000 mg | ORAL_TABLET | Freq: Every day | ORAL | Status: DC

## 2022-04-25 MED ORDER — PREDNISONE 20 MG PO TABS
20.0000 mg | ORAL_TABLET | Freq: Every day | ORAL | Status: AC
  Administered 2022-04-26 – 2022-04-28 (×3): 20 mg via ORAL
  Filled 2022-04-25 (×3): qty 1

## 2022-04-25 MED ORDER — CYANOCOBALAMIN 1000 MCG/ML IJ SOLN
1000.0000 ug | Freq: Once | INTRAMUSCULAR | Status: AC
  Administered 2022-04-25: 1000 ug via INTRAMUSCULAR
  Filled 2022-04-25: qty 1

## 2022-04-25 MED ORDER — FUROSEMIDE 10 MG/ML IJ SOLN
40.0000 mg | Freq: Once | INTRAMUSCULAR | Status: AC
  Administered 2022-04-25: 40 mg via INTRAVENOUS
  Filled 2022-04-25: qty 4

## 2022-04-25 MED ORDER — BENZONATATE 100 MG PO CAPS
100.0000 mg | ORAL_CAPSULE | Freq: Three times a day (TID) | ORAL | Status: DC | PRN
  Administered 2022-04-25: 100 mg via ORAL
  Filled 2022-04-25: qty 1

## 2022-04-25 MED ORDER — CARVEDILOL 3.125 MG PO TABS
3.1250 mg | ORAL_TABLET | Freq: Two times a day (BID) | ORAL | Status: DC
  Administered 2022-04-25 – 2022-04-26 (×3): 3.125 mg via ORAL
  Filled 2022-04-25 (×3): qty 1

## 2022-04-25 NOTE — Progress Notes (Signed)
HD#0 Subjective:   Summary: 56 year old male with past medical history of TUD/AUD who presented with progressive dyspnea for the past week and is admitted for acute hypoxemic respiratory failure likely secondary to exacerbation of undiagnosed COPD/heart failure.  Overnight Events: None  Patient is stable this morning and only requiring 2 L supplemental oxygen for comfort.  He has been peeing well and had improvement in his dyspnea.  He denies any new or worsening symptoms.  Objective:  Vital signs in last 24 hours: Vitals:   04/25/22 0403 04/25/22 0500 04/25/22 0751 04/25/22 1152  BP: (!) 137/102  (!) 160/101 (!) 155/104  Pulse: 98  97 91  Resp: 18  18 18   Temp: 98 F (36.7 C)  98 F (36.7 C) (!) 97.4 F (36.3 C)  TempSrc: Oral  Oral Oral  SpO2: 97%  98% 100%  Weight:  102.1 kg    Height:       Supplemental O2: Room Air SpO2: 100 % O2 Flow Rate (L/min): 2 L/min   Physical Exam:  Constitutional: well-appearing male sitting in bed, in no acute distress Eyes: conjunctiva non-erythematous Neck: supple, JVD to the angle of the jaw with hepatojugular reflex Cardiovascular: regular rate and rhythm Pulmonary/Chest: normal work of breathing on room air, moderate crackles in the mid and lower lung fields bilaterally Abdominal: soft, non-tender, non-distended MSK: normal bulk and tone, 0-1+ pitting edema in the lower extremities bilaterally just above the ankles Neurological: alert & oriented x 3 Skin: warm and dry  Filed Weights   04/24/22 0132 04/25/22 0500  Weight: 108.9 kg 102.1 kg     Intake/Output Summary (Last 24 hours) at 04/25/2022 1449 Last data filed at 04/25/2022 1000 Gross per 24 hour  Intake 417 ml  Output --  Net 417 ml   Net IO Since Admission: -1,983 mL [04/25/22 1449]  Pertinent Labs:    Latest Ref Rng & Units 04/25/2022    8:00 AM 04/25/2022    5:41 AM 04/24/2022    1:59 AM  CBC  WBC 4.0 - 10.5 K/uL 9.7  10.0    Hemoglobin 13.0 - 17.0 g/dL  12.2  12.0  12.9   Hematocrit 39.0 - 52.0 % 38.9  37.2  38.0   Platelets 150 - 400 K/uL 232  217         Latest Ref Rng & Units 04/25/2022   11:50 AM 04/25/2022    5:41 AM 04/24/2022   12:11 PM  CMP  Glucose 70 - 99 mg/dL 125  94  159   BUN 6 - 20 mg/dL 18  19  16    Creatinine 0.61 - 1.24 mg/dL 1.43  1.46  1.41   Sodium 135 - 145 mmol/L 141  140  141   Potassium 3.5 - 5.1 mmol/L 3.6  3.4  4.2   Chloride 98 - 111 mmol/L 106  107  107   CO2 22 - 32 mmol/L 25  24  22    Calcium 8.9 - 10.3 mg/dL 9.2  9.0  9.1   Total Protein 6.5 - 8.1 g/dL  6.9  7.1   Total Bilirubin 0.3 - 1.2 mg/dL  0.6  0.6   Alkaline Phos 38 - 126 U/L  45  48   AST 15 - 41 U/L  12  17   ALT 0 - 44 U/L  11  12     Imaging: US RENAL  Result Date: 04/24/2022 CLINICAL DATA:  Acute kidney injury EXAM: RENAL / URINARY TRACT  ULTRASOUND COMPLETE COMPARISON:  None Available. FINDINGS: Right Kidney: Renal measurements: 11 x 4.2 x 4.3 cm = volume: 104 mL. Echogenicity within normal limits. No mass or hydronephrosis visualized. Left Kidney: Renal measurements: 11.1 x 5.3 x 4.7 cm = volume: 144 mL. Echogenicity within normal limits. No mass or hydronephrosis visualized. Bladder: Appears normal for degree of bladder distention. Other: None. IMPRESSION: Normal examination.  No hydronephrosis. Electronically Signed   By: Lucienne Capers M.D.   On: 04/24/2022 21:38    Assessment/Plan:   Principal Problem:   Acute hypoxemic respiratory failure (HCC) Active Problems:   AKI (acute kidney injury) ([place])   Community acquired pneumonia   Acute congestive heart failure ([place])   Patient Summary: Hector Manning is a 56 y.o. with a pertinent PMH of TUD/AUD who presented with progressive dyspnea for the past week and is admitted for acute hypoxemic respiratory failure likely secondary to exacerbation of undiagnosed COPD/heart failure.   Acute Hypoxemic Respiratory Failure Healthcare naive patient presents with progressive dyspnea,  lower extremity edema, and productive cough. 20 year smoking history with some benefit from inhalers and nebulizers. Bicarb initially low but normalized. BNP also elevated at 3000 with mild troponin elevation (38-51-28). CXR showed cardiomegaly with pulmonary congestion and mild interstitial edema/infiltrates. CTA chest showed no evidence of PE but trace bilateral pleural effusions and groundglass opacity in the left upper lobe.  Also found to have iron deficiency anemia and normal phosphorus.  He was requiring oxygen and improved with IV diuresis.  On initial presentation is pointing to likely a component of COPD with heart failure and an exacerbation of both cannot be ruled out.  He was started on antibiotics in addition to the IV diuresis and DuoNebs.  Antibiotics were stopped after negative Pro-Cal x2.  Preliminary read of the TEE by our team showed apparent global hypokinesis of the left ventricle with moderate to severely reduced EF.  Discussed with the patient the likely diagnosis and treatment.  He agrees to stay for further diuresis and initiation of other GDMT. - Awaiting final read of TTE - Lasix 40 mg IV twice daily - Monitor and outs and daily weights -Start carvedilol 3.125 mg twice daily - Start losartan 25 mg daily tomorrow  Hypertension Blood pressure elevated throughout admission with systolics up to 301 and diastolics 60-109.  He was started on amlodipine 5 mg but unfortunately did not get this medication. - Carvedilol and losartan as above  AKI Patient presented with creatinine of 1.59.  Prior creatinine of 1.04 in 08/2021.  After diuresis his creatinine dropped to 1.46.  It is difficult to know if this is an AKI or if he has any baseline in the setting of untreated heart failure.   -We will continue to monitor this   Normocytic anemia Iron deficiency anemia Patient presented with a hemoglobin of 12 without any known source of bleeding.  Further work-up showed hypoproliferation  with a low rate reticulocyte index at 1.55.  Iron studies showed iron of 31, saturation of 8, and ferritin of 24.  Smear was unremarkable, folate was normal, other than the anemia CBCs without abnormalities. -We will give IV iron - Will need screening colonoscopy after discharge  Tobacco use disorder Alcohol use disorder Tobacco use of half a pack a day for 20 years.  Alcohol use of 2X 40 ounce beers a day and 25 ounces of liquor on the weekends.  No history of alcohol withdrawal.  He has not had a drink in 2 weeks  and has had some tremors but otherwise denies any more symptoms of withdrawal.  CIWA during his admission have been consistently 0.  TOC was consulted for alcohol cessation. -Continue CIWA and nicotine patches   Diet: Normal IVF: None VTE: Enoxaparin Code: Full   Dispo: Anticipated discharge to Home in 1-3 days pending further evaluation and management.   [facility] Resident PGY-1 Pager: [phone]  Please contact the on call pager after 5 pm and on weekends at [phone].

## 2022-04-25 NOTE — TOC Initial Note (Signed)
Transition of Care [facility]) - Initial/Assessment Note    Patient Details  Name: Hector Manning MRN: [id number] Date of Birth: September 17, 1965  Transition of Care [facility]) CM/SW Contact:    Curlene Labrum, RN Phone Number: 04/25/2022, 12:16 PM  Clinical Narrative:                 CM met with the patient at the bedside and CAGE assessment completed and the patient was offered Substance Abuse counseling follow up along with smoking cessation information.  The patient has no DME at home and is independent of ADL's.  The patient plans to have family provide transportation to home.  PCP was set up and included in the discharge instructions.  Expected Discharge Plan: Home/Self Care Barriers to Discharge: Continued Medical Work up   Patient Goals and CMS Choice Patient states their goals for this hospitalization and ongoing recovery are:: To get better and go home. CMS Medicare.gov Compare Post Acute Care list provided to:: Patient Choice offered to / list presented to : Patient  Expected Discharge Plan and Services Expected Discharge Plan: Home/Self Care   Discharge Planning Services: CM Consult   Living arrangements for the past 2 months: Single Family Home                                      Prior Living Arrangements/Services Living arrangements for the past 2 months: Single Family Home Lives with:: Friends Patient language and need for interpreter reviewed:: Yes Do you feel safe going back to the place where you live?: Yes      Need for Family Participation in Patient Care: Yes (Comment) Care giver support system in place?: Yes (comment)   Criminal Activity/Legal Involvement Pertinent to Current Situation/Hospitalization: No - Comment as needed  Activities of Daily Living      Permission Sought/Granted Permission sought to share information with : Case Manager, Family Supports, PCP Permission granted to share information with : Yes, Verbal Permission Granted      Permission granted to share info w AGENCY: PCP for hospital follow up  Permission granted to share info w Relationship: Son, Raydell Maners, JR will be providing transportation to home.     Emotional Assessment Appearance:: Appears stated age Attitude/Demeanor/Rapport: Gracious Affect (typically observed): Accepting Orientation: : Oriented to Self, Oriented to Place, Oriented to  Time, Oriented to Situation Alcohol / Substance Use: Tobacco Use, Alcohol Use Psych Involvement: No (comment)  Admission diagnosis:  Respiratory distress [R06.03] AKI (acute kidney injury) ([place]) [N17.9] Acute hypoxemic respiratory failure ([place]) [J96.01] Community acquired pneumonia, unspecified laterality [J18.9] Acute congestive heart failure, unspecified heart failure type [facility]) [I50.9] Patient Active Problem List   Diagnosis Date Noted   Acute hypoxemic respiratory failure ([place]) 04/24/2022   Status post surgery 06/25/2021   PCP:  Patient, No Pcp Per Pharmacy:   [facility] [place], [address] [place] [address] Phone: [phone] Fax: [phone]  [facility] [phone] - [place], [address] AT [address] [place] [address] Phone: [phone] Fax: [phone]     Social Determinants of Health (SDOH) Interventions    Readmission Risk Interventions     No data to display

## 2022-04-26 DIAGNOSIS — J9601 Acute respiratory failure with hypoxia: Secondary | ICD-10-CM

## 2022-04-26 MED ORDER — FUROSEMIDE 10 MG/ML IJ SOLN
40.0000 mg | Freq: Once | INTRAMUSCULAR | Status: AC
  Administered 2022-04-26: 40 mg via INTRAVENOUS
  Filled 2022-04-26: qty 4

## 2022-04-26 MED ORDER — POTASSIUM CHLORIDE 20 MEQ PO PACK
40.0000 meq | PACK | Freq: Every day | ORAL | Status: DC
  Administered 2022-04-26 – 2022-04-28 (×3): 40 meq via ORAL
  Filled 2022-04-26 (×3): qty 2

## 2022-04-26 MED ORDER — FUROSEMIDE 40 MG PO TABS
40.0000 mg | ORAL_TABLET | Freq: Every day | ORAL | Status: DC
  Administered 2022-04-26: 40 mg via ORAL
  Filled 2022-04-26: qty 1

## 2022-04-26 NOTE — Progress Notes (Addendum)
HD#2 SUBJECTIVE:  Patient Summary: Hector Manning is a 56 y.o. with a pertinent PMH of tobacco and alcohol use disorder, who presented with progressive dyspnea and admitted for acute hypoxic respiratory failure secondary to new diagnosis of HFrEF.   Overnight Events: No acute events overnight  Interim History: Patient evaluated at the bedside this morning. He states that his breathing is improved. He denies any chest pain, palpitations, or leg swelling. He is eating/drinking well and has no problems with ambulation.   OBJECTIVE:  Vital Signs: Vitals:   04/25/22 1811 04/25/22 2044 04/26/22 0200 04/26/22 0500  BP: (!) 133/100 (!) 151/93  138/88  Pulse: 94 95 80 97  Resp: 18 18  17   Temp: 98.7 F (37.1 C) 98.7 F (37.1 C)  98.5 F (36.9 C)  TempSrc: Oral Oral  Oral  SpO2: 98% 96%  98%  Weight:      Height:       Supplemental O2: Room Air SpO2: 98 % O2 Flow Rate (L/min): 2 L/min  Filed Weights   04/24/22 0132 04/25/22 0500  Weight: 108.9 kg 102.1 kg     Intake/Output Summary (Last 24 hours) at 04/26/2022 0648 Last data filed at 04/25/2022 2300 Gross per 24 hour  Intake 240 ml  Output 350 ml  Net -110 ml   Net IO Since Admission: -2,333 mL [04/26/22 0648]  Physical Exam: General: Pleasant, well-appearing male laying in bed. No acute distress. CV: RRR. No murmurs, rubs, or gallops.  Trace lower extremity pitting edema. Pulmonary: Normal work of breathing on room air.  Some crackles in bilateral lower lung fields. No wheezing Abdominal: Soft, nontender, nondistended.  Extremities: Palpable radial and DP pulses. Normal ROM. Skin: Warm and dry.  Neuro: A&Ox3. No focal deficit. Psych: Normal mood and affect    ASSESSMENT/PLAN:  Assessment: Principal Problem:   Acute hypoxemic respiratory failure (HCC) Active Problems:   AKI (acute kidney injury) (HCC)   Community acquired pneumonia   Acute congestive heart failure (HCC)   Plan: Acute hypoxemic respiratory  failure Heart failure with reduced ejection fraction, newly diagnosed Patient initially presented with progressive dyspnea, lower extremity edema, and a productive cough. CTA chest ruled out PE, but did show bilateral pleural effusions. An echocardiogram was obtained due to concern of CHF, and revealed an EF of 25-30% with global hypokinesis, and grade II diastolic dysfunction. Patient was diuresed with IV lasix and is net -2.3L. He was also started on GDMT and will need follow up with the [facility] for further titration of his medications. - Will consult cardiology for possible ischemic workup given new diagnosis of HFrEF - Transition to oral lasix 40 mg daily -  Start losartan 25 mg daily - Continue coreg 3.125 mg bid - Consider addition of SGLT2i at hospital follow up  Hypertension Blood pressures initially elevated to 160s/90-100s. BP has been improved since starting on coreg.  - Coreg and losartan as above  AKI vs CKD Patient presented with Cr of 1.59, now down to 1.43 after diuresis. Previous records in March show a Cr of 1.04, although unsure if this is a true AKI or CKD in the setting of untreated hypertension and heart failure.  - Daily BMP - Avoid nephrotoxins  Iron deficiency anemia Hb levels around consistently around 12, with no identifiable source of bleeding. Retic index was low at 1.55 and iron studies showed evidence of iron deficiency anemia. Patient is s/p 250 mg IV Ferrlecit.  - Will start po iron supplement at discharge -  Will need outpatient screening colonoscopy in the near future  Tobacco use disorder Alcoho use disorder No signs of alcohol withdrawal (last drink was about 2 weeks ago) and CIWA scores have been consistently 0.  - Continue CIWA - Nicotine patch   Best Practice: Diet: Regular diet IVF: Fluids: none VTE: enoxaparin (LOVENOX) injection 40 mg Start: 04/24/22 1200 Code: Full AB: none DISPO: Anticipated discharge today or tomorrow to Home pending  Medical stability.  Signature: Buddy Duty, D.O.  Internal Medicine Resident, PGY-2 [facility]  Pager: [phone] 6:48 AM, 04/26/2022   Please contact the on call pager after 5 pm and on weekends at [phone].

## 2022-04-26 NOTE — Plan of Care (Signed)
  Problem: Education: Goal: Knowledge of disease or condition will improve Outcome: Progressing   Problem: Respiratory: Goal: Ability to maintain a clear airway will improve Outcome: Progressing Goal: Ability to maintain adequate ventilation will improve Outcome: Progressing   Problem: Education: Goal: Knowledge of General Education information will improve Description: Including pain rating scale, medication(s)/side effects and non-pharmacologic comfort measures Outcome: Progressing   Problem: Coping: Goal: Level of anxiety will decrease Outcome: Progressing   Problem: Elimination: Goal: Will not experience complications related to bowel motility Outcome: Progressing

## 2022-04-26 NOTE — Consult Note (Signed)
Cardiology Consult   Patient ID: Hector Manning; MRN: [id number]; DOB: September 15, 1965   Admission date: 04/24/2022  Primary Care Provider: Patient, No Pcp Per Primary Cardiologist: None Primary Electrophysiologist:  none  Chief Complaint:  Inpatient ischemic evaluation  Patient Profile:   Hector Manning is a 56 y.o. male with a history of tobacco use and alcohol use who presents with hypoxic respiratory failure.  Who is presenting for SOB  History of Present Illness:   [name] is feeling much better.   Was last feeling well two weeks ago. 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  He lives in [place] and has been looking to make some life changes.  He has worked to cut back tobacco use and has been alcohol free for two weeks.  HE has had progressive worsening SOB.  Started with abdominal swelling.  Worse PND and Orthopnea.  Resting SOB lead to ED eval.  He does not have insurance, though he thinks he can get insurance with his mother and sister.They do not offer insurance at his work  Has had no chest pain, chest pressure, chest tightness, chest stinging .    No syncope or near syncope . Notes  no palpitations or funny heart beats.     In ED CT, showed trace effusions, CAC, in the LAD and Lcx. Troponin hsc of 28. BNP was increased to 3089. Found to have slight AKI vs unmaksing of baseline CKD. He has received lasix BB and ARB feels better.  No other substance use.  Given his lack of insurance, cardiology called for inpatient ischemic testing.   History reviewed. No pertinent past medical history.  Past Surgical History:  Procedure Laterality Date   ABDOMINAL SURGERY     INCISION AND DRAINAGE WOUND WITH NERVE AND ARTERY REPAIR Left 06/24/2021   Procedure: LEFT WRIST VOLVAR LACERATION WOUND EXPLORATION. INCISION AND DRAINAGE WOUND WITH NERVE TIMES ONE, AND TENDON TIMES SIX, ARTERY TIMES ONE  REPAIR;  Surgeon: Orene Desanctis, MD;  Location: [facility];  Service: Orthopedics;  Laterality: Left;      Medications Prior to Admission: Prior to Admission medications   Medication Sig Start Date End Date Taking? Authorizing Provider  acetaminophen (TYLENOL) 500 MG tablet Take 1,000 mg by mouth every 4 (four) hours as needed for mild pain.   Yes [provider]  DM-Phenylephrine-Acetaminophen (ALKA-SELTZER PLS SINUS & COUGH) 10-5-325 MG CAPS Take 2 capsules by mouth every 6 (six) hours as needed (cough / cold).   Yes [provider]  ibuprofen (ADVIL) 200 MG tablet Take 400 mg by mouth every 2 (two) hours as needed for mild pain.   Yes [provider]  naphazoline-pheniramine (VISINE) 0.025-0.3 % ophthalmic solution Place 2 drops into both eyes 2 (two) times daily as needed for eye irritation.   Yes [provider]  Pseudoephedrine-APAP-DM (DAYQUIL MULTI-SYMPTOM COLD/FLU PO) Take 30 mLs by mouth every hour as needed (As needed for cough).   Yes [provider]  colchicine 0.6 MG tablet Take 2 tablets by mouth once.  Then take 1 tablet one hour later once. Patient not taking: Reported on 06/25/2021 08/06/20   Sharion Balloon, NP     Allergies:   No Known Allergies  Social History:   Social History   Socioeconomic History   Marital status: Single    Spouse name: Not on file   Number of children: Not on file   Years of education: Not on file   Highest education level:  Not on file  Occupational History   Not on file  Tobacco Use   Smoking status: Every Day    Packs/day: 0.50    Types: Cigarettes   Smokeless tobacco: Never  Substance and Sexual Activity   Alcohol use: Yes    Comment: couple beers a day   Drug use: No   Sexual activity: Not on file  Other Topics Concern   Not on file  Social History Narrative   Not on file   Social Determinants of Health   Financial Resource Strain: Not on file  Food Insecurity: Not on file  Transportation Needs: Not on file  Physical Activity: Not on file  Stress: Not on file  Social Connections:  Not on file  Intimate Partner Violence: Not on file    Family History:   Mother has OSA.  ROS:  Please see the history of present illness.  All other ROS reviewed and negative.     Physical Exam/Data:   Vitals:   04/25/22 2044 04/26/22 0200 04/26/22 0500 04/26/22 0829  BP: (!) 151/93  138/88 (!) 149/111  Pulse:  80 97 93  Resp: 18  17 18   Temp: 98.7 F (37.1 C)  98.5 F (36.9 C) 98.4 F (36.9 C)  TempSrc: Oral  Oral Oral  SpO2: 96%  98% 94%  Weight:   102 kg   Height:        Intake/Output Summary (Last 24 hours) at 04/26/2022 1343 Last data filed at 04/25/2022 2300 Gross per 24 hour  Intake --  Output 350 ml  Net -350 ml   Filed Weights   04/24/22 0132 04/25/22 0500 04/26/22 0500  Weight: 108.9 kg 102.1 kg 102 kg   Body mass index is 31.36 kg/m.   Gen: No distress,  Cardiac: No Rubs or Gallops, no murmur, RRR +2 radial pulses Respiratory: Expiratory wheezes, normal effort, normal  respiratory rate GI: Soft, nontender, distended with fluid wave MS: No edema;  moves all extremities Integument: Skin feels warm Neuro:  At time of evaluation, alert and oriented to person/place/time/situation  Psych: Normal affect, patient feels ok    EKG:  The ECG that was done  was personally reviewed and demonstrates  Sinus tachycardia with LAE,  LVH; Qtc 485  Tele: SR  CTPE notable for LAD and LCX CAC   Relevant CV Studies:  Cardiac Studies & Procedures       ECHOCARDIOGRAM  ECHOCARDIOGRAM COMPLETE 04/25/2022  Narrative ECHOCARDIOGRAM REPORT    Patient Name:   Hector Manning Date of Exam: 04/25/2022 Medical Rec #:  [id number]     Height:       71.0 in Accession #:    [id number]    Weight:       225.1 lb Date of Birth:  1966-02-23      BSA:          2.217 m Patient Age:    31 years      BP:           160/101 mmHg Patient Gender: M             HR:           90 bpm. Exam Location:  Inpatient  Procedure: 2D Echo, Cardiac Doppler and Color  Doppler  Indications:    Dyspnea  History:        Patient has no prior history of Echocardiogram examinations.  Sonographer:    Memory Argue Referring Phys: [place]  1. Left ventricular ejection fraction, by estimation, is 25 to 30%. The left ventricle has severely decreased function. The left ventricle demonstrates global hypokinesis. The left ventricular internal cavity size was mildly dilated. Left ventricular diastolic parameters are consistent with Grade II diastolic dysfunction (pseudonormalization). 2. Right ventricular systolic function is normal. The right ventricular size is normal. 3. The mitral valve is normal in structure. No evidence of mitral valve regurgitation. No evidence of mitral stenosis. 4. The aortic valve is normal in structure. Aortic valve regurgitation is not visualized. No aortic stenosis is present. 5. The inferior vena cava is normal in size with <50% respiratory variability, suggesting right atrial pressure of 8 mmHg.  FINDINGS Left Ventricle: Left ventricular ejection fraction, by estimation, is 25 to 30%. The left ventricle has severely decreased function. The left ventricle demonstrates global hypokinesis. The left ventricular internal cavity size was mildly dilated. There is no left ventricular hypertrophy. Left ventricular diastolic parameters are consistent with Grade II diastolic dysfunction (pseudonormalization).  Right Ventricle: The right ventricular size is normal. No increase in right ventricular wall thickness. Right ventricular systolic function is normal.  Left Atrium: Left atrial size was normal in size.  Right Atrium: Right atrial size was normal in size.  Pericardium: There is no evidence of pericardial effusion.  Mitral Valve: The mitral valve is normal in structure. No evidence of mitral valve regurgitation. No evidence of mitral valve stenosis.  Tricuspid Valve: The tricuspid valve is normal in structure.  Tricuspid valve regurgitation is not demonstrated. No evidence of tricuspid stenosis.  Aortic Valve: The aortic valve is normal in structure. Aortic valve regurgitation is not visualized. No aortic stenosis is present. Aortic valve mean gradient measures 3.0 mmHg. Aortic valve peak gradient measures 5.3 mmHg. Aortic valve area, by VTI measures 3.04 cm.  Pulmonic Valve: The pulmonic valve was normal in structure. Pulmonic valve regurgitation is not visualized. No evidence of pulmonic stenosis.  Aorta: The aortic root is normal in size and structure.  Venous: The inferior vena cava is normal in size with less than 50% respiratory variability, suggesting right atrial pressure of 8 mmHg.  IAS/Shunts: No atrial level shunt detected by color flow Doppler.   LEFT VENTRICLE PLAX 2D LVIDd:         6.20 cm      Diastology LVIDs:         5.60 cm      LV e' medial:    4.08 cm/s LV PW:         1.20 cm      LV E/e' medial:  21.2 LV IVS:        1.00 cm      LV e' lateral:   7.29 cm/s LVOT diam:     2.30 cm      LV E/e' lateral: 11.9 LV SV:         63 LV SV Index:   28 LVOT Area:     4.15 cm  LV Volumes (MOD) LV vol d, MOD A2C: 161.0 ml LV vol d, MOD A4C: 250.0 ml LV vol s, MOD A2C: 117.0 ml LV vol s, MOD A4C: 186.0 ml LV SV MOD A2C:     44.0 ml LV SV MOD A4C:     250.0 ml LV SV MOD BP:      54.1 ml  RIGHT VENTRICLE TAPSE (M-mode): 1.6 cm  LEFT ATRIUM              Index  RIGHT ATRIUM           Index LA diam:        4.20 cm  1.89 cm/m   RA Area:     15.95 cm LA Vol (A2C):   94.9 ml  42.80 ml/m  RA Volume:   35.35 ml  15.94 ml/m LA Vol (A4C):   108.0 ml 48.71 ml/m LA Biplane Vol: 103.0 ml 46.46 ml/m AORTIC VALVE AV Area (Vmax):    3.16 cm AV Area (Vmean):   3.08 cm AV Area (VTI):     3.04 cm AV Vmax:           115.00 cm/s AV Vmean:          86.200 cm/s AV VTI:            0.208 m AV Peak Grad:      5.3 mmHg AV Mean Grad:      3.0 mmHg LVOT Vmax:         87.50  cm/s LVOT Vmean:        63.800 cm/s LVOT VTI:          0.152 m LVOT/AV VTI ratio: 0.73  AORTA Ao Root diam: 3.50 cm Ao Asc diam:  3.60 cm  MITRAL VALVE MV Area (PHT): 5.06 cm    SHUNTS MV Decel Time: 150 msec    Systemic VTI:  0.15 m MV E velocity: 86.50 cm/s  Systemic Diam: 2.30 cm MV A velocity: 55.70 cm/s MV E/A ratio:  1.55  Candee Furbish MD Electronically signed by Candee Furbish MD Signature Date/Time: 04/25/2022/3:15:57 PM    Final               Laboratory Data:  Chemistry Recent Labs  Lab 04/25/22 0541 04/25/22 1150  NA 140 141  K 3.4* 3.6  CL 107 106  CO2 24 25  GLUCOSE 94 125*  BUN 19 18  CREATININE 1.46* 1.43*  CALCIUM 9.0 9.2  GFRNONAA 57* 58*  ANIONGAP 9 10    Recent Labs  Lab 04/24/22 1211 04/25/22 0541  PROT 7.1 6.9  ALBUMIN 3.2* 3.1*  AST 17 12*  ALT 12 11  ALKPHOS 48 45  BILITOT 0.6 0.6   Hematology Recent Labs  Lab 04/25/22 0541 04/25/22 0800 04/25/22 0805  WBC 10.0 9.7  --   RBC 4.20* 4.34 4.31  HGB 12.0* 12.2*  --   HCT 37.2* 38.9*  --   MCV 88.6 89.6  --   MCH 28.6 28.1  --   MCHC 32.3 31.4  --   RDW 14.0 13.9  --   PLT 217 232  --    Cardiac EnzymesNo results for input(s): "TROPONINI" in the last 168 hours. No results for input(s): "TROPIPOC" in the last 168 hours.  BNP Recent Labs  Lab 04/24/22 0140  BNP 3,089.4*    DDimer No results for input(s): "DDIMER" in the last 168 hours.  Radiology/Studies:  No results found.  Assessment and Plan:    Heart Failure Reduced Ejection Fraction  Tobacco abuse SDH: no insurance AKI vs CKD CAC - NYHA class I, Stage C, hypervolemic, etiology from ischemia vs alcohol abuse suspected - Diuretic regimen: Lasix 40 mg IV first does today - Coreg 3.125 mg PO BID; not in shock; can increase - losartan 25 mg can likely increase if this is CKD given his elevated BP - SGLT2i and ARNI are cost prohibitive unless he can get financial support.  Goal would be to get patient  euvolemic.  When this is achieved would consider LHC and RHC. Will continue to follow     For questions or updates, please contact [place] Please consult [email or web address] for contact info under Cardiology/STEMI.   Rudean Haskell, MD [address], [id number] [place], [place] [address] [phone]  1:43 PM

## 2022-04-27 DIAGNOSIS — N189 Chronic kidney disease, unspecified: Secondary | ICD-10-CM

## 2022-04-27 DIAGNOSIS — I5020 Unspecified systolic (congestive) heart failure: Secondary | ICD-10-CM

## 2022-04-27 DIAGNOSIS — I130 Hypertensive heart and chronic kidney disease with heart failure and stage 1 through stage 4 chronic kidney disease, or unspecified chronic kidney disease: Secondary | ICD-10-CM

## 2022-04-27 DIAGNOSIS — F17210 Nicotine dependence, cigarettes, uncomplicated: Secondary | ICD-10-CM

## 2022-04-27 LAB — BASIC METABOLIC PANEL
Anion gap: 8 (ref 5–15)
BUN: 17 mg/dL (ref 6–20)
CO2: 26 mmol/L (ref 22–32)
Calcium: 9.3 mg/dL (ref 8.9–10.3)
Chloride: 104 mmol/L (ref 98–111)
Creatinine, Ser: 1.37 mg/dL — ABNORMAL HIGH (ref 0.61–1.24)
GFR, Estimated: >60 mL/min (ref >60)
Glucose, Bld: 99 mg/dL (ref 70–99)
Potassium: 4.3 mmol/L (ref 3.5–5.1)
Sodium: 138 mmol/L (ref 135–145)

## 2022-04-27 LAB — CBC
HCT: 41 % (ref 39.0–52.0)
Hemoglobin: 12.5 g/dL — ABNORMAL LOW (ref 13.0–17.0)
MCH: 28.2 pg (ref 26.0–34.0)
MCHC: 30.5 g/dL (ref 30.0–36.0)
MCV: 92.3 fL (ref 80.0–100.0)
Platelets: 262 10*3/uL (ref 150–400)
RBC: 4.44 MIL/uL (ref 4.22–5.81)
RDW: 13.8 % (ref 11.5–15.5)
WBC: 11.7 10*3/uL — ABNORMAL HIGH (ref 4.0–10.5)
nRBC: 0 % (ref 0.0–0.2)

## 2022-04-27 LAB — MAGNESIUM: Magnesium: 2.2 mg/dL (ref 1.7–2.4)

## 2022-04-27 MED ORDER — FUROSEMIDE 10 MG/ML IJ SOLN
40.0000 mg | Freq: Two times a day (BID) | INTRAMUSCULAR | Status: DC
  Administered 2022-04-27 – 2022-04-29 (×5): 40 mg via INTRAVENOUS
  Filled 2022-04-27 (×5): qty 4

## 2022-04-27 MED ORDER — CARVEDILOL 6.25 MG PO TABS
6.2500 mg | ORAL_TABLET | Freq: Two times a day (BID) | ORAL | Status: DC
  Administered 2022-04-27 – 2022-04-28 (×2): 6.25 mg via ORAL
  Filled 2022-04-27 (×2): qty 1

## 2022-04-27 MED ORDER — MELATONIN 5 MG PO TABS
5.0000 mg | ORAL_TABLET | Freq: Every evening | ORAL | Status: DC | PRN
  Administered 2022-04-27 – 2022-04-28 (×2): 5 mg via ORAL
  Filled 2022-04-27 (×2): qty 1

## 2022-04-27 MED ORDER — LOSARTAN POTASSIUM 50 MG PO TABS
50.0000 mg | ORAL_TABLET | Freq: Every day | ORAL | Status: DC
  Administered 2022-04-27 – 2022-04-28 (×2): 50 mg via ORAL
  Filled 2022-04-27 (×2): qty 1

## 2022-04-27 NOTE — Progress Notes (Signed)
Progress Note  Patient Name: Hector Manning Date of Encounter: 04/27/2022  Primary Cardiologist: New to [facility] Gasper Sells)  Subjective   Overnight slight improvement in creatinine. Patient notes that he feels fine.  Notes that he is diuresing at a level incongruous with recorded outs No CP, SOB, Palpitations.  Inpatient Medications    Scheduled Meds:  carvedilol  3.125 mg Oral BID WC   enoxaparin (LOVENOX) injection  40 mg Subcutaneous Daily   folic acid  1 mg Oral Daily   losartan  25 mg Oral Daily   multivitamin with minerals  1 tablet Oral Daily   nicotine  14 mg Transdermal Daily   potassium chloride  40 mEq Oral Daily   predniSONE  20 mg Oral Q breakfast   senna  1 tablet Oral BID   thiamine  100 mg Oral Daily   Continuous Infusions:  PRN Meds: acetaminophen **OR** acetaminophen, albuterol, benzonatate, LORazepam **OR** LORazepam   Vital Signs    Vitals:   04/26/22 1400 04/26/22 1642 04/26/22 2102 04/27/22 0526  BP:  (!) 145/100 122/81 (!) 144/107  Pulse: 90 87 79 72  Resp:  18 16 17   Temp:  98.7 F (37.1 C) 98.7 F (37.1 C) 98.1 F (36.7 C)  TempSrc:  Oral Oral   SpO2: 96% 99% 97% 100%  Weight:      Height:       No intake or output data in the 24 hours ending 04/27/22 0730 Filed Weights   04/24/22 0132 04/25/22 0500 04/26/22 0500  Weight: 108.9 kg 102.1 kg 102 kg    Telemetry    SR - Personally Reviewed  Physical Exam   Gen: no distress  Cardiac: No Rubs or Gallops, no Murmur, RRR +2 radial pulses Respiratory: Clear to auscultation bilaterally, normal effort, normal  respiratory rate GI: Soft, nontender, distended; there is dullness to percussion and positive fluid wave MS: No  edema;  moves all extremities Integument: Skin feels warm Neuro:  At time of evaluation, alert and oriented to person/place/time/situation  Psych: Normal affect, patient feels ok   Labs    Chemistry Recent Labs  Lab 04/24/22 1211 04/25/22 0541  04/25/22 1150 04/27/22 0220  NA 141 140 141 138  K 4.2 3.4* 3.6 4.3  CL 107 107 106 104  CO2 22 24 25 26   GLUCOSE 159* 94 125* 99  BUN 16 19 18 17   CREATININE 1.41* 1.46* 1.43* 1.37*  CALCIUM 9.1 9.0 9.2 9.3  PROT 7.1 6.9  --   --   ALBUMIN 3.2* 3.1*  --   --   AST 17 12*  --   --   ALT 12 11  --   --   ALKPHOS 48 45  --   --   BILITOT 0.6 0.6  --   --   GFRNONAA 59* 57* 58* >60  ANIONGAP 12 9 10 8      Hematology Recent Labs  Lab 04/25/22 0541 04/25/22 0800 04/25/22 0805 04/27/22 0220  WBC 10.0 9.7  --  11.7*  RBC 4.20* 4.34 4.31 4.44  HGB 12.0* 12.2*  --  12.5*  HCT 37.2* 38.9*  --  41.0  MCV 88.6 89.6  --  92.3  MCH 28.6 28.1  --  28.2  MCHC 32.3 31.4  --  30.5  RDW 14.0 13.9  --  13.8  PLT 217 232  --  262    Cardiac EnzymesNo results for input(s): "TROPONINI" in the last 168 hours. No results  for input(s): "TROPIPOC" in the last 168 hours.   BNP Recent Labs  Lab 04/24/22 0140  BNP 3,089.4*     DDimer No results for input(s): "DDIMER" in the last 168 hours.   Radiology    ECHOCARDIOGRAM COMPLETE  Result Date: 04/25/2022    ECHOCARDIOGRAM REPORT   Patient Name:   ARYAMAN HALIBURTON Date of Exam: 04/25/2022 Medical Rec #:  [id number]     Height:       71.0 in Accession #:    [id number]    Weight:       225.1 lb Date of Birth:  1965/07/19      BSA:          2.217 m Patient Age:    56 years      BP:           160/101 mmHg Patient Gender: M             HR:           90 bpm. Exam Location:  Inpatient Procedure: 2D Echo, Cardiac Doppler and Color Doppler Indications:    Dyspnea  History:        Patient has no prior history of Echocardiogram examinations.  Sonographer:    Gaynell Face Referring Phys: [id number] ERIK C HOFFMAN IMPRESSIONS  1. Left ventricular ejection fraction, by estimation, is 25 to 30%. The left ventricle has severely decreased function. The left ventricle demonstrates global hypokinesis. The left ventricular internal cavity size was mildly dilated. Left  ventricular diastolic parameters are consistent with Grade II diastolic dysfunction (pseudonormalization).  2. Right ventricular systolic function is normal. The right ventricular size is normal.  3. The mitral valve is normal in structure. No evidence of mitral valve regurgitation. No evidence of mitral stenosis.  4. The aortic valve is normal in structure. Aortic valve regurgitation is not visualized. No aortic stenosis is present.  5. The inferior vena cava is normal in size with <50% respiratory variability, suggesting right atrial pressure of 8 mmHg. FINDINGS  Left Ventricle: Left ventricular ejection fraction, by estimation, is 25 to 30%. The left ventricle has severely decreased function. The left ventricle demonstrates global hypokinesis. The left ventricular internal cavity size was mildly dilated. There is no left ventricular hypertrophy. Left ventricular diastolic parameters are consistent with Grade II diastolic dysfunction (pseudonormalization). Right Ventricle: The right ventricular size is normal. No increase in right ventricular wall thickness. Right ventricular systolic function is normal. Left Atrium: Left atrial size was normal in size. Right Atrium: Right atrial size was normal in size. Pericardium: There is no evidence of pericardial effusion. Mitral Valve: The mitral valve is normal in structure. No evidence of mitral valve regurgitation. No evidence of mitral valve stenosis. Tricuspid Valve: The tricuspid valve is normal in structure. Tricuspid valve regurgitation is not demonstrated. No evidence of tricuspid stenosis. Aortic Valve: The aortic valve is normal in structure. Aortic valve regurgitation is not visualized. No aortic stenosis is present. Aortic valve mean gradient measures 3.0 mmHg. Aortic valve peak gradient measures 5.3 mmHg. Aortic valve area, by VTI measures 3.04 cm. Pulmonic Valve: The pulmonic valve was normal in structure. Pulmonic valve regurgitation is not visualized. No  evidence of pulmonic stenosis. Aorta: The aortic root is normal in size and structure. Venous: The inferior vena cava is normal in size with less than 50% respiratory variability, suggesting right atrial pressure of 8 mmHg. IAS/Shunts: No atrial level shunt detected by color flow Doppler.  LEFT VENTRICLE PLAX 2D LVIDd:  6.20 cm      Diastology LVIDs:         5.60 cm      LV e' medial:    4.08 cm/s LV PW:         1.20 cm      LV E/e' medial:  21.2 LV IVS:        1.00 cm      LV e' lateral:   7.29 cm/s LVOT diam:     2.30 cm      LV E/e' lateral: 11.9 LV SV:         63 LV SV Index:   28 LVOT Area:     4.15 cm  LV Volumes (MOD) LV vol d, MOD A2C: 161.0 ml LV vol d, MOD A4C: 250.0 ml LV vol s, MOD A2C: 117.0 ml LV vol s, MOD A4C: 186.0 ml LV SV MOD A2C:     44.0 ml LV SV MOD A4C:     250.0 ml LV SV MOD BP:      54.1 ml RIGHT VENTRICLE TAPSE (M-mode): 1.6 cm LEFT ATRIUM              Index        RIGHT ATRIUM           Index LA diam:        4.20 cm  1.89 cm/m   RA Area:     15.95 cm LA Vol (A2C):   94.9 ml  42.80 ml/m  RA Volume:   35.35 ml  15.94 ml/m LA Vol (A4C):   108.0 ml 48.71 ml/m LA Biplane Vol: 103.0 ml 46.46 ml/m  AORTIC VALVE AV Area (Vmax):    3.16 cm AV Area (Vmean):   3.08 cm AV Area (VTI):     3.04 cm AV Vmax:           115.00 cm/s AV Vmean:          86.200 cm/s AV VTI:            0.208 m AV Peak Grad:      5.3 mmHg AV Mean Grad:      3.0 mmHg LVOT Vmax:         87.50 cm/s LVOT Vmean:        63.800 cm/s LVOT VTI:          0.152 m LVOT/AV VTI ratio: 0.73  AORTA Ao Root diam: 3.50 cm Ao Asc diam:  3.60 cm MITRAL VALVE MV Area (PHT): 5.06 cm    SHUNTS MV Decel Time: 150 msec    Systemic VTI:  0.15 m MV E velocity: 86.50 cm/s  Systemic Diam: 2.30 cm MV A velocity: 55.70 cm/s MV E/A ratio:  1.55 Donato Schultz MD Electronically signed by Donato Schultz MD Signature Date/Time: 04/25/2022/3:15:57 PM    Final     Patient Profile     56 y.o. male with new HF   Assessment & Plan     Heart Failure  Reduced Ejection Fraction  HTN Tobacco abuse SDH: no insurance AKI vs CKD CAC - NYHA class I, Stage C, hypervolemic, etiology from ischemia vs alcohol abuse suspected - Diuretic regimen: Lasix 40 mg IV BID - Coreg 6.25 mg PO BID (increased) - losartan 50 (increased) - SGLT2i and ARNI are cost prohibitive unless he can get financial support. - BNP tomorrow - discussed the mortality of untreated HF; and that he does have HTN - discussed cessation of tobacco and alcohol  When euvolemic may consider in patient LHC/RHC     For questions or updates, please contact [facility] Please consult [email or web address] for contact info under Cardiology/STEMI.      Riley Lam, MD FASE Cardiologist [facility]  [address] [place], [address] [place], [place] [address] [phone]  7:30 AM

## 2022-04-27 NOTE — Progress Notes (Signed)
HD#3 SUBJECTIVE:  Patient Summary: Hector Manning is a 56 y.o. with a pertinent PMH of tobacco and alcohol use disorder, who presented with progressive dyspnea and admitted for acute hypoxic respiratory failure secondary to new diagnosis of HFrEF (EF 25-30%).   Overnight Events: No acute events overnight  Interim History: The patient states that he feels fine. He has no shortness of breath, chest pain, or palpitations. Has been eating/drinking well and he has no acute concerns at this time.   OBJECTIVE:  Vital Signs: Vitals:   04/26/22 1400 04/26/22 1642 04/26/22 2102 04/27/22 0526  BP:  (!) 145/100 122/81 (!) 144/107  Pulse: 90 87 79 72  Resp:  18 16 17   Temp:  98.7 F (37.1 C) 98.7 F (37.1 C) 98.1 F (36.7 C)  TempSrc:  Oral Oral   SpO2: 96% 99% 97% 100%  Weight:      Height:       Supplemental O2: Room Air SpO2: 100 % O2 Flow Rate (L/min): 2 L/min  Filed Weights   04/24/22 0132 04/25/22 0500 04/26/22 0500  Weight: 108.9 kg 102.1 kg 102 kg    No intake or output data in the 24 hours ending 04/27/22 0648 Net IO Since Admission: -2,333 mL [04/27/22 0648]  Physical Exam: General: Pleasant, well-appearing male laying in bed. No acute distress. CV: RRR. No murmurs. No LE edema Pulmonary: Lungs CTAB. Normal effort. No wheezing or rales. Abdominal: Soft, nontender, nondistended.  Extremities: Palpable radial and DP pulses. Normal ROM. Skin: Warm and dry.  Neuro: A&Ox3. No focal deficit. Psych: Normal mood and affect    ASSESSMENT/PLAN:  Assessment: Principal Problem:   Acute hypoxemic respiratory failure (HCC) Active Problems:   AKI (acute kidney injury) ([place])   Community acquired pneumonia   Acute congestive heart failure (HCC)   Plan: Acute hypoxic respiratory failure, resolved Heart failure with reduced ejection fracture, newly diagnosed, NYHA class I Echocardiogram revealed EF of 25-30%, global hypokinesis, and grade II diastolic dysfunction. Unclear  etiology of new HFrEF, but could be secondary to long history of untreated hypertension vs ischemic cardiomyopathy. Consulted cardiology yesterday for consideration of ischemic evaluation while the patient is admitted. Overall, I&Os not recorded, but generally appears to be diuresing well. - Cardiology consulted for ischemic evaluation, appreciate assistance - IV lasix 40 mg bid - Increase losartan to 50 mg daily - Increase Coreg to 6.25 mg bid - SGLT2i/ARNI likely cost prohibitive without insurance  Hypertension BP improved, but remains elevated to 140s/100s.  - Increase losartan and coreg as above  AKI vs CKD Cr on admission 1.59, down to 1.37 and has been stable in that range.  - Daily BMP - Avoid nephrotoxins  Iron deficiency anemia Hb stable in 12s, no source of bleeding identified. S/p 250 mg IV ferrlecit. - PO iron supplement - Daily CBC - Will need outpatient screening colonoscopy in the near future   Tobacco use disorder Alcoho use disorder No signs of alcohol withdrawal (last drink was about 2 weeks ago) and CIWA scores have been consistently 0.  - Continue CIWA - Nicotine patch  Best Practice: Diet: Regular diet IVF: Fluids: none VTE: enoxaparin (LOVENOX) injection 40 mg Start: 04/24/22 1200 Code: Full AB: none DISPO: Anticipated discharge in 1-3 days to Home pending Medical stability.  Signature: Buddy Duty, D.O.  Internal Medicine Resident, PGY-2 [facility]  Pager: [phone] 6:48 AM, 04/27/2022   Please contact the on call pager after 5 pm and on weekends at [phone].

## 2022-04-28 ENCOUNTER — Encounter (HOSPITAL_COMMUNITY): Payer: Self-pay | Admitting: Internal Medicine

## 2022-04-28 ENCOUNTER — Other Ambulatory Visit (HOSPITAL_COMMUNITY): Payer: Self-pay

## 2022-04-28 DIAGNOSIS — I5041 Acute combined systolic (congestive) and diastolic (congestive) heart failure: Secondary | ICD-10-CM

## 2022-04-28 DIAGNOSIS — Z87891 Personal history of nicotine dependence: Secondary | ICD-10-CM

## 2022-04-28 LAB — CBC
HCT: 43.6 % (ref 39.0–52.0)
Hemoglobin: 13.8 g/dL (ref 13.0–17.0)
MCH: 28.6 pg (ref 26.0–34.0)
MCHC: 31.7 g/dL (ref 30.0–36.0)
MCV: 90.5 fL (ref 80.0–100.0)
Platelets: 281 10*3/uL (ref 150–400)
RBC: 4.82 MIL/uL (ref 4.22–5.81)
RDW: 13.8 % (ref 11.5–15.5)
WBC: 12.8 10*3/uL — ABNORMAL HIGH (ref 4.0–10.5)
nRBC: 0 % (ref 0.0–0.2)

## 2022-04-28 LAB — BASIC METABOLIC PANEL
Anion gap: 11 (ref 5–15)
BUN: 20 mg/dL (ref 6–20)
CO2: 28 mmol/L (ref 22–32)
Calcium: 9.7 mg/dL (ref 8.9–10.3)
Chloride: 101 mmol/L (ref 98–111)
Creatinine, Ser: 1.62 mg/dL — ABNORMAL HIGH (ref 0.61–1.24)
GFR, Estimated: 50 mL/min — ABNORMAL LOW (ref >60)
Glucose, Bld: 108 mg/dL — ABNORMAL HIGH (ref 70–99)
Potassium: 3.8 mmol/L (ref 3.5–5.1)
Sodium: 140 mmol/L (ref 135–145)

## 2022-04-28 LAB — BRAIN NATRIURETIC PEPTIDE: B Natriuretic Peptide: 1190.6 pg/mL — ABNORMAL HIGH (ref 0.0–100.0)

## 2022-04-28 MED ORDER — LOSARTAN POTASSIUM 50 MG PO TABS: 50.0000 mg | ORAL_TABLET | Freq: Every day | ORAL | Status: DC

## 2022-04-28 MED ORDER — CARVEDILOL 12.5 MG PO TABS
12.5000 mg | ORAL_TABLET | Freq: Two times a day (BID) | ORAL | Status: DC
  Administered 2022-04-28 – 2022-04-29 (×3): 12.5 mg via ORAL
  Filled 2022-04-28 (×3): qty 1

## 2022-04-28 MED ORDER — LOSARTAN POTASSIUM 50 MG PO TABS: 50.0000 mg | ORAL_TABLET | Freq: Two times a day (BID) | ORAL | Status: DC

## 2022-04-28 MED ORDER — SODIUM CHLORIDE 0.9% FLUSH
3.0000 mL | Freq: Two times a day (BID) | INTRAVENOUS | Status: DC
  Administered 2022-04-28 – 2022-04-29 (×3): 3 mL via INTRAVENOUS

## 2022-04-28 NOTE — Progress Notes (Signed)
Heart Failure Nurse Navigator Progress Note  PCP: Patient, No Pcp Per PCP-Cardiologist: None Admission Diagnosis: Acute kidney injury, Community acquired pneumonia, Acute congestive heart failure.  Admitted from: Home via EMS  Presentation:   Hector Manning presented with shortness of breath x 1 week, with brown sputum from cough. Chest tightness, BP 170/126. HR 115, BNP 3,089, Troponin 38, EKG showed sinus tachycardia. IV lasix and antibiotics given. Started on 2 L O2. Patient scheduled for a Right / Left heart cath on 04/29/22.   Patient was educated on the sign and symptoms of heart failure, daily weight, when to call his doctor, or g o to the ED, Diet/ fluid restrictions ( patient does his own cooking and hasn't paid attention to his salt intake, uses can foods, and drinks soda) Taking all medications as prescribed ( currently has no insurance, patietn being set up with patient assistance and TOC to bedside upon dis charge) attending all medical appointments. Patient asked a number of questions and voiced his understanding of education, a hospital follow up with HF TOC on 05/09/2022 @ 11 am.   ECHO/ LVEF: 25-30% G2DD  Clinical Course:  History reviewed. No pertinent past medical history.   Social History   Socioeconomic History   Marital status: Single    Spouse name: Not on file   Number of children: 4   Years of education: Not on file   Highest education level: High school graduate  Occupational History   Occupation: [employer]    Comment: receiving NO checks at this moment  Tobacco Use   Smoking status: Former    Packs/day: 0.50    Types: Cigarettes    Quit date: 04/13/2022    Years since quitting: 0.0   Smokeless tobacco: Never  Vaping Use   Vaping Use: Never used  Substance and Sexual Activity   Alcohol use: Yes    Comment: couple beers a day   Drug use: No   Sexual activity: Not on file  Other Topics Concern   Not on file  Social History Narrative   Not on file    Social Determinants of Health   Financial Resource Strain: High Risk (04/28/2022)   Overall Financial Resource Strain (CARDIA)    Difficulty of Paying Living Expenses: Very hard  Food Insecurity: No Food Insecurity (04/28/2022)   Hunger Vital Sign    Worried About Running Out of Food in the Last Year: Never true    Ran Out of Food in the Last Year: Never true  Transportation Needs: No Transportation Needs (04/28/2022)   PRAPARE - Administrator, Civil Service (Medical): No    Lack of Transportation (Non-Medical): No  Physical Activity: Not on file  Stress: Not on file  Social Connections: Not on file   Education Assessment and Provision:  Detailed education and instructions provided on heart failure disease management including the following:  Signs and symptoms of Heart Failure When to call the physician Importance of daily weights Low sodium diet Fluid restriction Medication management Anticipated future follow-up appointments  Patient education given on each of the above topics.  Patient acknowledges understanding via teach back method and acceptance of all instructions.  Education Materials:  "Living Better With Heart Failure" Booklet, HF zone tool, & Daily Weight Tracker Tool.  Patient has scale at home: yes Patient has pill box at home: NA    High Risk Criteria for Readmission and/or Poor Patient Outcomes: Heart failure hospital admissions (last 6 months): 0  No Show  rate: 0 Difficult social situation: Yes, lives in a hotel Demonstrates medication adherence: No Primary Language: English Literacy level: Reading, writing, and comprehension.   Barriers of Care:   Continued HF education Medication compliance  Diet/ fluid / daily weights Pharm: Med assist has no insurance.    Considerations/Referrals:   Referral made to Heart Failure Pharmacist Stewardship: yes Referral made to Heart Failure CSW/NCM TOC: Yes, lives in hotel Referral made to Heart  & Vascular TOC clinic: Yes, 05/09/2022 @ 11 am.   Items for Follow-up on DC/TOC: Continued HF education Medication complaince Diet/ fluid/ daily weights Pharm: med assist ( No ins) CSW: lives in hotel )   Hector Manning, BSN, RN Heart Failure Transport planner Only

## 2022-04-28 NOTE — Progress Notes (Signed)
Progress Note  Patient Name: Hector Manning Date of Encounter: 04/28/2022  Primary Cardiologist: None   Subjective   Patient seen and examined at his bedside.   Inpatient Medications    Scheduled Meds:  carvedilol  6.25 mg Oral BID WC   enoxaparin (LOVENOX) injection  40 mg Subcutaneous Daily   folic acid  1 mg Oral Daily   furosemide  40 mg Intravenous BID   losartan  50 mg Oral Daily   multivitamin with minerals  1 tablet Oral Daily   nicotine  14 mg Transdermal Daily   potassium chloride  40 mEq Oral Daily   senna  1 tablet Oral BID   thiamine  100 mg Oral Daily   Continuous Infusions:  PRN Meds: acetaminophen **OR** acetaminophen, albuterol, benzonatate, melatonin   Vital Signs    Vitals:   04/27/22 1556 04/27/22 2056 04/28/22 0631 04/28/22 0832  BP: 138/88 128/83 (!) 143/103 (!) 145/88  Pulse: 81 72 73 72  Resp: 17 18  18   Temp: 97.7 F (36.5 C) 98 F (36.7 C) 98.1 F (36.7 C) 98.2 F (36.8 C)  TempSrc: Oral Oral Oral Oral  SpO2: 100% 95% 98% 95%  Weight:      Height:        Intake/Output Summary (Last 24 hours) at 04/28/2022 0843 Last data filed at 04/27/2022 2057 Gross per 24 hour  Intake 220 ml  Output 1900 ml  Net -1680 ml   Filed Weights   04/24/22 0132 04/25/22 0500 04/26/22 0500  Weight: 108.9 kg 102.1 kg 102 kg    Telemetry    Sinus rhythm - Personally Reviewed  ECG    None today - Personally Reviewed  Physical Exam    General: Comfortable, lying in bed Head: Atraumatic, normal size  Eyes: PEERLA, EOMI  Neck: Supple, normal JVD Cardiac: Normal S1, S2; RRR; no murmurs, rubs, or gallops Lungs: Clear to auscultation bilaterally Abd: Soft, nontender, no hepatomegaly  Ext: warm, no edema Musculoskeletal: No deformities, BUE and BLE strength normal and equal Skin: Warm and dry, no rashes   Neuro: Alert and oriented to person, place, time, and situation, CNII-XII grossly intact, no focal deficits  Psych: Normal mood and affect    Labs    Chemistry Recent Labs  Lab 04/24/22 1211 04/25/22 0541 04/25/22 1150 04/27/22 0220 04/28/22 0236  NA 141 140 141 138 140  K 4.2 3.4* 3.6 4.3 3.8  CL 107 107 106 104 101  CO2 22 24 25 26 28   GLUCOSE 159* 94 125* 99 108*  BUN 16 19 18 17 20   CREATININE 1.41* 1.46* 1.43* 1.37* 1.62*  CALCIUM 9.1 9.0 9.2 9.3 9.7  PROT 7.1 6.9  --   --   --   ALBUMIN 3.2* 3.1*  --   --   --   AST 17 12*  --   --   --   ALT 12 11  --   --   --   ALKPHOS 48 45  --   --   --   BILITOT 0.6 0.6  --   --   --   GFRNONAA 59* 57* 58* >60 50*  ANIONGAP 12 9 10 8 11      Hematology Recent Labs  Lab 04/25/22 0800 04/25/22 0805 04/27/22 0220 04/28/22 0236  WBC 9.7  --  11.7* 12.8*  RBC 4.34 4.31 4.44 4.82  HGB 12.2*  --  12.5* 13.8  HCT 38.9*  --  41.0 43.6  MCV  89.6  --  92.3 90.5  MCH 28.1  --  28.2 28.6  MCHC 31.4  --  30.5 31.7  RDW 13.9  --  13.8 13.8  PLT 232  --  262 281    Cardiac EnzymesNo results for input(s): "TROPONINI" in the last 168 hours. No results for input(s): "TROPIPOC" in the last 168 hours.   BNP Recent Labs  Lab 04/24/22 0140 04/28/22 0236  BNP 3,089.4* 1,190.6*     DDimer No results for input(s): "DDIMER" in the last 168 hours.   Radiology    No results found.  Cardiac Studies   TTE 04/25/2022 IMPRESSIONS     1. Left ventricular ejection fraction, by estimation, is 25 to 30%. The  left ventricle has severely decreased function. The left ventricle  demonstrates global hypokinesis. The left ventricular internal cavity size  was mildly dilated. Left ventricular  diastolic parameters are consistent with Grade II diastolic dysfunction  (pseudonormalization).   2. Right ventricular systolic function is normal. The right ventricular  size is normal.   3. The mitral valve is normal in structure. No evidence of mitral valve  regurgitation. No evidence of mitral stenosis.   4. The aortic valve is normal in structure. Aortic valve regurgitation is   not visualized. No aortic stenosis is present.   5. The inferior vena cava is normal in size with <50% respiratory  variability, suggesting right atrial pressure of 8 mmHg.   FINDINGS   Left Ventricle: Left ventricular ejection fraction, by estimation, is 25  to 30%. The left ventricle has severely decreased function. The left  ventricle demonstrates global hypokinesis. The left ventricular internal  cavity size was mildly dilated. There  is no left ventricular hypertrophy. Left ventricular diastolic parameters  are consistent with Grade II diastolic dysfunction (pseudonormalization).   Right Ventricle: The right ventricular size is normal. No increase in  right ventricular wall thickness. Right ventricular systolic function is  normal.   Left Atrium: Left atrial size was normal in size.   Right Atrium: Right atrial size was normal in size.   Pericardium: There is no evidence of pericardial effusion.   Mitral Valve: The mitral valve is normal in structure. No evidence of  mitral valve regurgitation. No evidence of mitral valve stenosis.   Tricuspid Valve: The tricuspid valve is normal in structure. Tricuspid  valve regurgitation is not demonstrated. No evidence of tricuspid  stenosis.   Aortic Valve: The aortic valve is normal in structure. Aortic valve  regurgitation is not visualized. No aortic stenosis is present. Aortic  valve mean gradient measures 3.0 mmHg. Aortic valve peak gradient measures  5.3 mmHg. Aortic valve area, by VTI  measures 3.04 cm.   Pulmonic Valve: The pulmonic valve was normal in structure. Pulmonic valve  regurgitation is not visualized. No evidence of pulmonic stenosis.   Aorta: The aortic root is normal in size and structure.   Venous: The inferior vena cava is normal in size with less than 50%  respiratory variability, suggesting right atrial pressure of 8 mmHg.   IAS/Shunts: No atrial level shunt detected by color flow Doppler.   Patient  Profile     56 y.o. male with newly diagnosed heart failure with reduced ejection fraction and cardiomyopathy  Assessment & Plan    Heart failure with reduced ejection fraction Hypertension Tobacco abuse Elevated creatinine: Acute kidney injury versus CKD  Clinically he appears to be improved.  He tells me his shortness of breath has improved significantly.  He  is now able to lie flat. We will keep him on the IV Lasix for now and plan to transition to p.o. tomorrow.  His total output over the last 24 hours 6080 mL. BNP today 1190 He is aware of his newly diagnosed heart failure as well as his EF.  We will continue his carvedilol but will increase that to 12.5 given his blood pressure is still elevated. Unfortunately there are some social barriers that may affect his care-we will recommend getting social work or care navigation on board to help this patient navigate these variants that have been identified that could cause problems in obtaining his medications and follow-ups.  He will now be able to tolerate his heart catheterization we will plan for right and left heart catheterization tomorrow. Please keep the patient n.p.o. past midnight be  Discussed the plan with the patient he is agreeable.  For questions or updates, please contact [place] Please consult [email or web address] for contact info under Cardiology/STEMI.      SignedBerniece Salines, DO  04/28/2022, 8:43 AM

## 2022-04-28 NOTE — TOC Initial Note (Signed)
Transition of Care [facility]) - Initial/Assessment Note    Patient Details  Name: Hector Manning MRN: [id number] Date of Birth: 10-16-1965  Transition of Care [facility]) CM/SW Contact:    Marilu Favre, RN Phone Number: 04/28/2022, 1:33 PM  Clinical Narrative:                  Damaris Schooner to patient at bedside, regarding PCP, [place], [place].   Patient stated he is going to arrange an appointment with his mother's PCP, NCM asked name of PCP, he stated he will call.   Discussed assistance through [employer], [employer] and [place] , [employer]  , [place]. Patient stated he wants to arrange his own PCP and use his own pharmacy [facility] on [address]  Expected Discharge Plan: Home/Self Care Barriers to Discharge: Continued Medical Work up   Patient Goals and CMS Choice Patient states their goals for this hospitalization and ongoing recovery are:: to return to home CMS Medicare.gov Compare Post Acute Care list provided to:: Patient Choice offered to / list presented to : Patient  Expected Discharge Plan and Services Expected Discharge Plan: Home/Self Care   Discharge Planning Services: CM Consult, [facility], [place], Medication Assistance (patient declined)   Living arrangements for the past 2 months: [place]                   DME Agency: NA       HH Arranged: NA          Prior Living Arrangements/Services Living arrangements for the past 2 months: Single Family Home Lives with:: Parents Patient language and need for interpreter reviewed:: Yes Do you feel safe going back to the place where you live?: Yes      Need for Family Participation in Patient Care: Yes (Comment) Care giver support system in place?: Yes (comment)   Criminal Activity/Legal Involvement Pertinent to Current Situation/Hospitalization: No - Comment as needed  Activities of Daily Living      Permission Sought/Granted Permission sought to share  information with : Case Manager, Family Supports, PCP Permission granted to share information with : No     Permission granted to share info w AGENCY: PCP for hospital follow up  Permission granted to share info w Relationship: Son, Hector Manning, Hector Manning will be providing transportation to home.     Emotional Assessment Appearance:: Appears stated age Attitude/Demeanor/Rapport: Self-Confident Affect (typically observed): Agitated Orientation: : Oriented to Self, Oriented to Place, Oriented to  Time, Oriented to Situation Alcohol / Substance Use: Not Applicable Psych Involvement: No (comment)  Admission diagnosis:  Respiratory distress [R06.03] AKI (acute kidney injury) ([place]) [N17.9] Acute hypoxemic respiratory failure ([place]) [J96.01] Community acquired pneumonia, unspecified laterality [J18.9] Acute congestive heart failure, unspecified heart failure type ([place]) [I50.9] Patient Active Problem List   Diagnosis Date Noted   AKI (acute kidney injury) ([place])    Community acquired pneumonia    Acute congestive heart failure ([place])    Acute hypoxemic respiratory failure ([place]) 04/24/2022   Status post surgery 06/25/2021   PCP:  Patient, No Pcp Per Pharmacy:   [facility] [place], [place] [address] Phone: [phone] Fax: [phone]  [facility] [phone] - [place], [address] [place] [address] Phone: [phone] Fax: [phone]  [facility] [address] [place] [address] Phone: [phone] Fax: [phone]     Social Determinants of Health (SDOH) Interventions Food Insecurity Interventions: Intervention Not Indicated Housing Interventions: Intervention Not Indicated Transportation Interventions: Intervention Not Indicated Alcohol Usage  Interventions: Intervention Not Indicated (Score <7) Financial Strain Interventions: Other (Comment) (medication assistance)  Readmission Risk Interventions     No data to display

## 2022-04-28 NOTE — Progress Notes (Signed)
HD#3 Subjective:   Summary: Hector Manning is a 56 y.o. with a pertinent PMH of tobacco and alcohol use disorder, who presented with progressive dyspnea and admitted for acute hypoxic respiratory failure secondary to new diagnosis of HFrEF (EF 25-30%).   Overnight Events: None   Patient is doing well this morning without any new or worsening complaints. He is breathing well on room air and denies return of his cough.   Objective:  Vital signs in last 24 hours: Vitals:   04/27/22 0816 04/27/22 1556 04/27/22 2056 04/28/22 0631  BP: 139/89 138/88 128/83 (!) 143/103  Pulse: 79 81 72 73  Resp: 17 17 18    Temp: 97.9 F (36.6 C) 97.7 F (36.5 C) 98 F (36.7 C) 98.1 F (36.7 C)  TempSrc: Oral Oral Oral Oral  SpO2: 98% 100% 95% 98%  Weight:      Height:       Supplemental O2: Room Air SpO2: 98 % O2 Flow Rate (L/min): 2 L/min   Physical Exam:  Constitutional: well-appearing male sitting in bed, in no acute distress Cardiovascular: regular rate and rhythm Pulmonary/Chest: normal work of breathing on room air, lungs clear to auscultation bilaterally MSK: normal bulk and tone, no LE edema Neurological: alert & oriented x 3 Skin: warm and dry  Filed Weights   04/24/22 0132 04/25/22 0500 04/26/22 0500  Weight: 108.9 kg 102.1 kg 102 kg     Intake/Output Summary (Last 24 hours) at 04/28/2022 0710 Last data filed at 04/27/2022 2057 Gross per 24 hour  Intake 220 ml  Output 2100 ml  Net -1880 ml   Net IO Since Admission: -4,213 mL [04/28/22 0710]  Pertinent Labs:    Latest Ref Rng & Units 04/28/2022    2:36 AM 04/27/2022    2:20 AM 04/25/2022    8:00 AM  CBC  WBC 4.0 - 10.5 K/uL 12.8  11.7  9.7   Hemoglobin 13.0 - 17.0 g/dL 04/27/2022  67.6  72.0   Hematocrit 39.0 - 52.0 % 43.6  41.0  38.9   Platelets 150 - 400 K/uL 281  262  232        Latest Ref Rng & Units 04/28/2022    2:36 AM 04/27/2022    2:20 AM 04/25/2022   11:50 AM  CMP  Glucose 70 - 99 mg/dL 04/27/2022  99  096    BUN 6 - 20 mg/dL 20  17  18    Creatinine 0.61 - 1.24 mg/dL 283   6.62   Sodium 135 - 145 mmol/L 140  138  141   Potassium 3.5 - 5.1 mmol/L 3.8  4.3  3.6   Chloride 98 - 111 mmol/L 101  104  106   CO2 22 - 32 mmol/L 28  26  25    Calcium 8.9 - 10.3 mg/dL 9.7  9.3  9.2     Imaging: No results found.  Assessment/Plan:   Principal Problem:   Acute hypoxemic respiratory failure (HCC) Active Problems:   AKI (acute kidney injury) (HCC)   Community acquired pneumonia   Acute congestive heart failure (HCC)   Patient Summary: Hector Manning is a 56 y.o. with a pertinent PMH of tobacco and alcohol use disorder who presented with progressive dyspnea and is admitted for acute hypoxic respiratory failure secondary to newly diagnosed HFrEF (EF 25-30%).    Acute hypoxic respiratory failure, resolved HFrEF NYHA class I, Stage C Echocardiogram revealed LVEF of 25-30%, LV global hypokinesis, and grade II  diastolic dysfunction. Unclear etiology of new HFrEF, but could be secondary to long history of untreated hypertension vs ischemic cardiomyopathy vs EtOH induce. Consulted cardiology for consideration of ischemic evaluation while the patient is admitted. Appears euvolemic today. - Cardiology consulted for ischemic evaluation, appreciate assistance - IV lasix 40 mg bid, plan to switch to PO tomorrow - Continue losartan to 50 mg daily - Increase Coreg to 12.5 mg bid - SGLT2i/ARNI likely cost prohibitive without insurance - Plan for heart cath tomorrow, NPO at midnight   Hypertension BP improved, but remains elevated to 140s/80s.  - losartan and coreg as above   AKI vs CKD Cr on admission 1.59, down to 1.37, now up to 1.62.  - Daily BMP - Avoid nephrotoxins   Iron deficiency anemia Hb stable and WNL now. S/p 250 mg IV ferrlecit. - PO iron supplement - Daily CBC - Will need outpatient screening colonoscopy in the near future    Tobacco use disorder Alcoho use disorder No signs of  alcohol withdrawal (last drink was about 2 weeks prior to admission) and CIWA scores have been consistently 0.  - d/c CIWA - Nicotine patch  Diet: Heart Healthy, NPO at midnight for heart cath 10/31 IVF: None VTE: Enoxaparin Code: Full TOC recs: Set up PCP follow up  Dispo: Anticipated discharge to Home in 1-2 days pending further evaluation of heart failure, heart cath tomorrow.   [facility] Resident PGY-1 Pager: [phone]  Please contact the on call pager after 5 pm and on weekends at [phone].

## 2022-04-28 NOTE — Progress Notes (Signed)
Patient resting comfortably in bed, pain level 0/10, patient stated that he had no needs at this time, lunch tray ordered, will continue to monitor pt.

## 2022-04-28 NOTE — Progress Notes (Signed)
   Heart Failure Stewardship Pharmacist Progress Note   PCP: Patient, No Pcp Per PCP-Cardiologist: None    HPI:  56 yo M with PMH of tobacco use and alcohol use.   He presented to the ED on 10/26 with shortness of breath x 1 week, productive cough, and LE edema. CXR with cardiomegaly and possible edema or infiltrate. CTA negative for PE or dissection. Concern for new CHF and PNA. ECHO on 10/27 showed LVEF 25-30%, G2DD, global hypokinesis, RV normal.  Plan for [facility] tomorrow.   Current HF Medications: Diuretic: furosemide 40 mg IV BID Beta Blocker: carvedilol 12.5 mg BID ACE/ARB/ARNI: losartan 50 mg daily  Prior to admission HF Medications: None  Pertinent Lab Values: Serum creatinine 1.62, BUN 20, Potassium 3.8, Sodium 140, BNP 1190.6, Magnesium 2.2, A1c 5.2   Vital Signs: Weight: 224 lbs (admission weight: 225 lbs) Blood pressure: 140/80s  Heart rate: 70s  I/O: -2.1L yesterday; net -4.2L  Medication Assistance / Insurance Benefits Check: Does the patient have prescription insurance?  No  Does the patient qualify for medication assistance through manufacturers or grants?   Yes Eligible grants and/or patient assistance programs: pending Medication assistance applications in progress: none  Medication assistance applications approved: none Approved medication assistance renewals will be completed by: pending  Outpatient Pharmacy:  [facility] Is the patient willing to use [place] pharmacy at discharge? Yes Is the patient willing to transition their outpatient pharmacy to utilize a [facility] outpatient pharmacy?   Pending    Assessment: 1. Acute systolic CHF (LVEF 16-10%), pending ischemic evaluation. NYHA class I symptoms. - Continue furosemide 40 mg IV BID. Strict I/Os and daily weights. Keep K>4 and Mag>2. KCl 40 mEq daily ordered for replacement. - Agree with increasing carvedilol to 12.5 mg BID - Losartan 50 mg BID reduced to  once daily for cath tomorrow. Consider optimizing to [facility] before discharge. Can enroll in patient assistance as he is uninsured. - Consider adding spironolactone after cath if renal function improves. - Consider adding SGLT2i before discharge. [employer] patient assistance application preferred for uninsured patients.   Plan: 1) Medication changes recommended at this time: - None pending cath tomorrow and improvement in renal function   2) Patient assistance: - Uninsured, will consult HF RNCM to set him up with [place] on discharge - Will complete patient assistance applications to allow for GDMT once these medications are added  3)  Education  - To be completed prior to discharge  Kerby Nora, PharmD, BCPS Heart Failure Stewardship Pharmacist Phone [phone]

## 2022-04-29 ENCOUNTER — Other Ambulatory Visit (HOSPITAL_COMMUNITY): Payer: Self-pay

## 2022-04-29 ENCOUNTER — Encounter (HOSPITAL_COMMUNITY)
Admission: EM | Disposition: A | Discharge status: Home / Self Care | Payer: Self-pay | Source: Home / Self Care | Attending: Internal Medicine

## 2022-04-29 DIAGNOSIS — I5020 Unspecified systolic (congestive) heart failure: Secondary | ICD-10-CM

## 2022-04-29 DIAGNOSIS — N1831 Chronic kidney disease, stage 3a: Secondary | ICD-10-CM

## 2022-04-29 HISTORY — PX: RIGHT/LEFT HEART CATH AND CORONARY ANGIOGRAPHY: CATH118266

## 2022-04-29 LAB — POCT I-STAT 7, (LYTES, BLD GAS, ICA,H+H)
Acid-base deficit: 2 mmol/L (ref 0.0–2.0)
Bicarbonate: 23.8 mmol/L (ref 20.0–28.0)
Calcium, Ion: 1.17 mmol/L (ref 1.15–1.40)
HCT: 45 % (ref 39.0–52.0)
Hemoglobin: 15.3 g/dL (ref 13.0–17.0)
O2 Saturation: 94 %
Potassium: 3.7 mmol/L (ref 3.5–5.1)
Sodium: 142 mmol/L (ref 135–145)
TCO2: 25 mmol/L (ref 22–32)
pCO2 arterial: 42.3 mmHg (ref 32–48)
pH, Arterial: 7.359 (ref 7.35–7.45)
pO2, Arterial: 76 mmHg — ABNORMAL LOW (ref 83–108)

## 2022-04-29 LAB — BASIC METABOLIC PANEL
Anion gap: 9 (ref 5–15)
BUN: 24 mg/dL — ABNORMAL HIGH (ref 6–20)
CO2: 24 mmol/L (ref 22–32)
Calcium: 9.4 mg/dL (ref 8.9–10.3)
Chloride: 106 mmol/L (ref 98–111)
Creatinine, Ser: 1.46 mg/dL — ABNORMAL HIGH (ref 0.61–1.24)
GFR, Estimated: 57 mL/min — ABNORMAL LOW (ref >60)
Glucose, Bld: 110 mg/dL — ABNORMAL HIGH (ref 70–99)
Potassium: 4 mmol/L (ref 3.5–5.1)
Sodium: 139 mmol/L (ref 135–145)

## 2022-04-29 LAB — POCT I-STAT EG7
Acid-Base Excess: 0 mmol/L (ref 0.0–2.0)
Bicarbonate: 26.4 mmol/L (ref 20.0–28.0)
Calcium, Ion: 1.25 mmol/L (ref 1.15–1.40)
HCT: 46 % (ref 39.0–52.0)
Hemoglobin: 15.6 g/dL (ref 13.0–17.0)
O2 Saturation: 63 %
Potassium: 4 mmol/L (ref 3.5–5.1)
Sodium: 141 mmol/L (ref 135–145)
TCO2: 28 mmol/L (ref 22–32)
pCO2, Ven: 47.2 mmHg (ref 44–60)
pH, Ven: 7.356 (ref 7.25–7.43)
pO2, Ven: 35 mmHg (ref 32–45)

## 2022-04-29 LAB — CBC
HCT: 44.3 % (ref 39.0–52.0)
Hemoglobin: 13.5 g/dL (ref 13.0–17.0)
MCH: 27.9 pg (ref 26.0–34.0)
MCHC: 30.5 g/dL (ref 30.0–36.0)
MCV: 91.5 fL (ref 80.0–100.0)
Platelets: 284 10*3/uL (ref 150–400)
RBC: 4.84 MIL/uL (ref 4.22–5.81)
RDW: 14 % (ref 11.5–15.5)
WBC: 13.6 10*3/uL — ABNORMAL HIGH (ref 4.0–10.5)
nRBC: 0 % (ref 0.0–0.2)

## 2022-04-29 SURGERY — RIGHT/LEFT HEART CATH AND CORONARY ANGIOGRAPHY
Anesthesia: LOCAL

## 2022-04-29 MED ORDER — SODIUM CHLORIDE 0.9% FLUSH: 3.0000 mL | Freq: Two times a day (BID) | INTRAVENOUS | Status: DC

## 2022-04-29 MED ORDER — HYDRALAZINE HCL 20 MG/ML IJ SOLN: 10.0000 mg | INTRAMUSCULAR | Status: AC | PRN

## 2022-04-29 MED ORDER — SODIUM CHLORIDE 0.9 % IV SOLN: INTRAVENOUS | Status: DC

## 2022-04-29 MED ORDER — HEPARIN SODIUM (PORCINE) 1000 UNIT/ML IJ SOLN
INTRAMUSCULAR | Status: AC
  Filled 2022-04-29: qty 10

## 2022-04-29 MED ORDER — IOHEXOL 350 MG/ML SOLN
INTRAVENOUS | Status: DC | PRN
  Administered 2022-04-29: 40 mL

## 2022-04-29 MED ORDER — FUROSEMIDE 40 MG PO TABS
40.0000 mg | ORAL_TABLET | Freq: Two times a day (BID) | ORAL | Status: DC
  Administered 2022-04-29: 40 mg via ORAL
  Filled 2022-04-29: qty 1

## 2022-04-29 MED ORDER — LIDOCAINE HCL (PF) 1 % IJ SOLN
INTRAMUSCULAR | Status: DC | PRN
  Administered 2022-04-29 (×2): 2 mL via INTRADERMAL

## 2022-04-29 MED ORDER — SPIRONOLACTONE 25 MG PO TABS
12.5000 mg | ORAL_TABLET | Freq: Every day | ORAL | 3 refills | Status: DC
  Filled 2022-04-29: qty 15, 30d supply, fill #0

## 2022-04-29 MED ORDER — CARVEDILOL 12.5 MG PO TABS
12.5000 mg | ORAL_TABLET | Freq: Two times a day (BID) | ORAL | 3 refills | Status: DC
  Filled 2022-04-29: qty 60, 30d supply, fill #0

## 2022-04-29 MED ORDER — SODIUM CHLORIDE 0.9 % IV SOLN: 250.0000 mL | INTRAVENOUS | Status: DC | PRN

## 2022-04-29 MED ORDER — HEPARIN (PORCINE) IN NACL 1000-0.9 UT/500ML-% IV SOLN
INTRAVENOUS | Status: AC
  Filled 2022-04-29: qty 1000

## 2022-04-29 MED ORDER — SODIUM CHLORIDE 0.9% FLUSH: 3.0000 mL | INTRAVENOUS | Status: DC | PRN

## 2022-04-29 MED ORDER — LOSARTAN POTASSIUM 50 MG PO TABS
50.0000 mg | ORAL_TABLET | Freq: Every day | ORAL | 3 refills | Status: DC
  Filled 2022-04-29: qty 30, 30d supply, fill #0

## 2022-04-29 MED ORDER — FENTANYL CITRATE (PF) 100 MCG/2ML IJ SOLN
INTRAMUSCULAR | Status: AC
  Filled 2022-04-29: qty 2

## 2022-04-29 MED ORDER — EMPAGLIFLOZIN 10 MG PO TABS
10.0000 mg | ORAL_TABLET | Freq: Every day | ORAL | 3 refills | Status: DC
  Filled 2022-04-29: qty 30, 30d supply, fill #0

## 2022-04-29 MED ORDER — MIDAZOLAM HCL 2 MG/2ML IJ SOLN
INTRAMUSCULAR | Status: DC | PRN
  Administered 2022-04-29: 2 mg via INTRAVENOUS

## 2022-04-29 MED ORDER — VERAPAMIL HCL 2.5 MG/ML IV SOLN
INTRAVENOUS | Status: DC | PRN
  Administered 2022-04-29: 10 mL via INTRA_ARTERIAL

## 2022-04-29 MED ORDER — FERROUS SULFATE 325 (65 FE) MG PO TABS: 325.0000 mg | ORAL_TABLET | Freq: Every day | ORAL | Status: DC

## 2022-04-29 MED ORDER — FENTANYL CITRATE (PF) 100 MCG/2ML IJ SOLN
INTRAMUSCULAR | Status: DC | PRN
  Administered 2022-04-29: 25 ug via INTRAVENOUS

## 2022-04-29 MED ORDER — LABETALOL HCL 5 MG/ML IV SOLN
INTRAVENOUS | Status: AC
  Filled 2022-04-29: qty 4

## 2022-04-29 MED ORDER — HEPARIN (PORCINE) IN NACL 1000-0.9 UT/500ML-% IV SOLN
INTRAVENOUS | Status: DC | PRN
  Administered 2022-04-29 (×2): 500 mL

## 2022-04-29 MED ORDER — ASPIRIN 81 MG PO CHEW
81.0000 mg | CHEWABLE_TABLET | ORAL | Status: AC
  Administered 2022-04-29: 81 mg via ORAL
  Filled 2022-04-29: qty 1

## 2022-04-29 MED ORDER — ONDANSETRON HCL 4 MG/2ML IJ SOLN: 4.0000 mg | Freq: Four times a day (QID) | INTRAMUSCULAR | Status: DC | PRN

## 2022-04-29 MED ORDER — LABETALOL HCL 5 MG/ML IV SOLN
10.0000 mg | INTRAVENOUS | Status: AC | PRN
  Administered 2022-04-29 (×2): 10 mg via INTRAVENOUS

## 2022-04-29 MED ORDER — SPIRONOLACTONE 12.5 MG HALF TABLET: 12.5000 mg | ORAL_TABLET | Freq: Every day | ORAL | Status: DC

## 2022-04-29 MED ORDER — HEPARIN SODIUM (PORCINE) 1000 UNIT/ML IJ SOLN
INTRAMUSCULAR | Status: DC | PRN
  Administered 2022-04-29: 5000 [IU] via INTRAVENOUS

## 2022-04-29 MED ORDER — ASPIRIN 81 MG PO CHEW: 81.0000 mg | CHEWABLE_TABLET | ORAL | Status: DC

## 2022-04-29 MED ORDER — FUROSEMIDE 40 MG PO TABS
40.0000 mg | ORAL_TABLET | Freq: Two times a day (BID) | ORAL | 3 refills | Status: DC
  Filled 2022-04-29: qty 60, 30d supply, fill #0

## 2022-04-29 MED ORDER — FERROUS SULFATE 325 (65 FE) MG PO TABS
325.0000 mg | ORAL_TABLET | Freq: Every day | ORAL | 3 refills | Status: DC
  Filled 2022-04-29: qty 30, 30d supply, fill #0

## 2022-04-29 MED ORDER — MIDAZOLAM HCL 2 MG/2ML IJ SOLN
INTRAMUSCULAR | Status: AC
  Filled 2022-04-29: qty 2

## 2022-04-29 SURGICAL SUPPLY — 13 items
BAND CMPR LRG ZPHR (HEMOSTASIS) ×1
BAND ZEPHYR COMPRESS 30 LONG (HEMOSTASIS) IMPLANT
CATH 5FR JL3.5 JR4 ANG PIG MP (CATHETERS) IMPLANT
CATH SWAN GANZ 7F STRAIGHT (CATHETERS) IMPLANT
GLIDESHEATH SLEND SS 6F .021 (SHEATH) IMPLANT
GLIDESHEATH SLENDER 7FR .021G (SHEATH) IMPLANT
GUIDEWIRE INQWIRE 1.5J.035X260 (WIRE) IMPLANT
INQWIRE 1.5J .035X260CM (WIRE) ×1
KIT HEART LEFT (KITS) ×2 IMPLANT
PACK CARDIAC CATHETERIZATION (CUSTOM PROCEDURE TRAY) ×2 IMPLANT
SHEATH PROBE COVER 6X72 (BAG) IMPLANT
TRANSDUCER W/STOPCOCK (MISCELLANEOUS) ×2 IMPLANT
TUBING CIL FLEX 10 FLL-RA (TUBING) ×2 IMPLANT

## 2022-04-29 NOTE — Discharge Instructions (Addendum)
You were hospitalized for acute heart failure exacerbation. Thank you for allowing [place] to be part of your care.   We arranged for you to follow up at:  [facility] on May 09, 2022 at 10:15 am.   Please note these changes made to your medications:   Please START taking:  Furosemide 40 mg twice daily Losartan 50 mg daily Carvedilol 12.5 mg twice daily Spironolactone 12.5 mg daily Empagliflozin 10 mg daily Ferrous sulfate 325 mg daily   Please make sure to call our office if you have any symptoms of low blood pressure such as fatigue, lightheadedness, dizziness, or syncope.  Please call our clinic if you have any questions or concerns, we may be able to help and keep you from a long and expensive emergency room wait. Our clinic and after hours phone number is [phone], the best time to call is Monday through Friday 9 am to 4 pm but there is always someone available 24/7 if you have an emergency. If you need medication refills please notify your pharmacy one week in advance and they will send [place] a request.   Johny Blamer, [place]  [facility] Residency

## 2022-04-29 NOTE — Progress Notes (Signed)
HD#4 Subjective:   Summary: Hector Manning is a 56 y.o. with a pertinent PMH of tobacco and alcohol use disorder, who presented with progressive dyspnea and admitted for acute hypoxic respiratory failure secondary to new diagnosis of HFrEF (EF 25-30%).   Overnight Events: None   Patient is doing well today without any new or worsening complaints.   Objective:  Vital signs in last 24 hours: Vitals:   04/28/22 0832 04/28/22 1647 04/28/22 2109 04/29/22 0433  BP: (!) 145/88 123/75 (!) 142/84 (!) 147/91  Pulse: 72 73 70 70  Resp: 18 18 17    Temp: 98.2 F (36.8 C) 98.3 F (36.8 C) 98.6 F (37 C) 98.2 F (36.8 C)  TempSrc: Oral Oral Oral Oral  SpO2: 95% 98% 95% 98%  Weight:      Height:       Supplemental O2: Room Air SpO2: 98 % O2 Flow Rate (L/min): 2 L/min   Physical Exam:  Constitutional: well-appearing male sitting in bed, in no acute distress Cardiovascular: regular rate and rhythm Pulmonary/Chest: normal work of breathing on room air, lungs clear to auscultation bilaterally MSK: normal bulk and tone, no LE edema Neurological: alert & oriented x 3 Skin: warm and dry  Filed Weights   04/24/22 0132 04/25/22 0500 04/26/22 0500  Weight: 108.9 kg 102.1 kg 102 kg     Intake/Output Summary (Last 24 hours) at 04/29/2022 05/01/2022 Last data filed at 04/28/2022 1308 Gross per 24 hour  Intake 240 ml  Output 450 ml  Net -210 ml   Net IO Since Admission: -4,423 mL [04/29/22 0702]  Pertinent Labs:    Latest Ref Rng & Units 04/29/2022    3:16 AM 04/28/2022    2:36 AM 04/27/2022    2:20 AM  CBC  WBC 4.0 - 10.5 K/uL 13.6  12.8  11.7   Hemoglobin 13.0 - 17.0 g/dL 04/29/2022  77.8  24.2   Hematocrit 39.0 - 52.0 % 44.3  43.6  41.0   Platelets 150 - 400 K/uL 284  281  262        Latest Ref Rng & Units 04/29/2022    3:16 AM 04/28/2022    2:36 AM 04/27/2022    2:20 AM  CMP  Glucose 70 - 99 mg/dL 04/29/2022  614  99   BUN 6 - 20 mg/dL 24  20  17    Creatinine 0.61 - 1.24 mg/dL 431    5.40   Sodium 135 - 145 mmol/L 139  140  138   Potassium 3.5 - 5.1 mmol/L 4.0  3.8  4.3   Chloride 98 - 111 mmol/L 106  101  104   CO2 22 - 32 mmol/L 24  28  26    Calcium 8.9 - 10.3 mg/dL 9.4  9.7  9.3     Imaging: No results found.  Assessment/Plan:   Principal Problem:   Acute hypoxemic respiratory failure (HCC) Active Problems:   AKI (acute kidney injury) (HCC)   Community acquired pneumonia   Acute congestive heart failure (HCC)   Patient Summary: Hector Manning is a 56 y.o. with a pertinent PMH of tobacco and alcohol use disorder who presented with progressive dyspnea and is admitted for acute hypoxic respiratory failure secondary to newly diagnosed HFrEF (EF 25-30%).      Acute hypoxic respiratory failure, resolved HFrEF NYHA class I, Stage C HTN Echocardiogram revealed LVEF of 25-30%, LV global hypokinesis, and grade II diastolic dysfunction. Unclear etiology of new HFrEF, but  could be secondary to long history of untreated hypertension vs ischemic cardiomyopathy vs EtOH induce. Consulted cardiology for consideration of ischemic evaluation while the patient is admitted. Appears euvolemic today. - Cardiology consulted for ischemic evaluation, appreciate assistance - switch IV to oral lasix 40 mg bid - Continue losartan to 50 mg daily - Continue Coreg to 12.5 mg bid - SGLT2i/ARNI likely cost prohibitive without insurance but we will work with patient assistance to start these - heart cath today - start spironolactone 12.5 mg tomorrow   AKI vs CKD 3a Cr on admission 1.59, down to 1.46. Has been stable around this so likely CKD stage 3a.  - Daily BMP - Avoid nephrotoxins   Iron deficiency anemia Hb stable and WNL now. S/p 250 mg IV ferrlecit. - PO iron supplement - Daily CBC - Will need outpatient screening colonoscopy in the near future    Tobacco use disorder Alcoho use disorder No signs of alcohol withdrawal, CIWAs 0 (last drink was about 2 weeks prior to  admission)  - Nicotine patch  Diet: Normal IVF: None VTE: Enoxaparin Code: Full   Dispo: Anticipated discharge to Home in 1-2 days pending heart cath results.   [facility] Resident PGY-1 Pager: [phone]  Please contact the on call pager after 5 pm and on weekends at [phone].

## 2022-04-29 NOTE — Progress Notes (Signed)
Patient consent signed and in chart for hearth cath procedure at 1:30p

## 2022-04-29 NOTE — Interval H&P Note (Signed)
History and Physical Interval Note:  04/29/2022 11:50 AM  Hector Manning  has presented today for surgery, with the diagnosis of cardiomyopathy.  The various methods of treatment have been discussed with the patient and family. After consideration of risks, benefits and other options for treatment, the patient has consented to  Procedure(s): RIGHT/LEFT HEART CATH AND CORONARY ANGIOGRAPHY (N/A) as a surgical intervention.  The patient's history has been reviewed, patient examined, no change in status, stable for surgery.  I have reviewed the patient's chart and labs.  Questions were answered to the patient's satisfaction.     Sherren Mocha

## 2022-04-29 NOTE — Progress Notes (Signed)
TR Band removed. Level 0, no hematoma, or pain upon palpation. Sheath insertion site bandaged and dressed with gauze and Tegaderm.

## 2022-04-29 NOTE — Progress Notes (Signed)
   Heart Failure Stewardship Pharmacist Progress Note   PCP: Patient, No Pcp Per PCP-Cardiologist: None    HPI:  56 yo M with PMH of tobacco use and alcohol use.   He presented to the ED on 10/26 with shortness of breath x 1 week, productive cough, and LE edema. CXR with cardiomegaly and possible edema or infiltrate. CTA negative for PE or dissection. Concern for new CHF and PNA. ECHO on 10/27 showed LVEF 25-30%, G2DD, global hypokinesis, RV normal.  Plan for [facility] today.   Current HF Medications: Diuretic: furosemide 40 mg IV BID Beta Blocker: carvedilol 12.5 mg BID ACE/ARB/ARNI: losartan 50 mg daily  Prior to admission HF Medications: None  Pertinent Lab Values: Serum creatinine 1.46, BUN 24, Potassium 4.0, Sodium 139, BNP 1190.6, Magnesium 2.2, A1c 5.2   Vital Signs: Weight: 224 lbs (admission weight: 225 lbs) Blood pressure: 140/90s  Heart rate: 70s  I/O: incomplete yesterday; net -5.3L  Medication Assistance / Insurance Benefits Check: Does the patient have prescription insurance?  No  Does the patient qualify for medication assistance through manufacturers or grants?   Yes Eligible grants and/or patient assistance programs: pending Medication assistance applications in progress: none  Medication assistance applications approved: none Approved medication assistance renewals will be completed by: pending  Outpatient Pharmacy:  [facility] Is the patient willing to use [place] pharmacy at discharge? Yes Is the patient willing to transition their outpatient pharmacy to utilize a [facility] outpatient pharmacy?   Pending    Assessment: 1. Acute systolic CHF (LVEF 95-18%), pending ischemic evaluation. NYHA class I symptoms. - Continue furosemide 40 mg IV BID. Strict I/Os and daily weights. Keep K>4 and Mag>2. - Continue carvedilol 12.5 mg BID - Losartan 50 mg BID reduced to once daily for cath. Consider optimizing to [facility]  before discharge. Can enroll in patient assistance as he is uninsured. - Consider adding spironolactone after cath  - Consider adding SGLT2i before discharge. [employer] patient assistance application preferred for uninsured patients.   Plan: 1) Medication changes recommended at this time: - Start spironolactone 12.5 mg daily after cath  2) Patient assistance: - Uninsured, will consult HF RNCM to set him up with [place] on discharge - Will complete patient assistance applications to allow for GDMT once these medications are added  3)  Education  - To be completed prior to discharge  Kerby Nora, PharmD, BCPS Heart Failure Cytogeneticist Phone [phone]

## 2022-04-29 NOTE — Progress Notes (Signed)
Patient transported to procedure via bed by transportation staff.

## 2022-04-29 NOTE — Progress Notes (Signed)
Progress Note  Patient Name: Hector Manning Date of Encounter: 04/29/2022  Primary Cardiologist: None   Subjective   Patient seen and examined at his bedside.  No further complaints this time. He is looking forward to heart catheterization today.  Inpatient Medications    Scheduled Meds:  carvedilol  12.5 mg Oral BID WC   enoxaparin (LOVENOX) injection  40 mg Subcutaneous Daily   folic acid  1 mg Oral Daily   furosemide  40 mg Intravenous BID   losartan  50 mg Oral Daily   multivitamin with minerals  1 tablet Oral Daily   nicotine  14 mg Transdermal Daily   potassium chloride  40 mEq Oral Daily   senna  1 tablet Oral BID   sodium chloride flush  3 mL Intravenous Q12H   thiamine  100 mg Oral Daily   Continuous Infusions:  sodium chloride 10 mL/hr at 04/29/22 0738   PRN Meds: acetaminophen **OR** acetaminophen, albuterol, benzonatate, melatonin   Vital Signs    Vitals:   04/28/22 1647 04/28/22 2109 04/29/22 0433 04/29/22 0740  BP: 123/75 (!) 142/84 (!) 147/91 (!) 143/103  Pulse: 73 70 70 68  Resp: 18 17    Temp: 98.3 F (36.8 C) 98.6 F (37 C) 98.2 F (36.8 C) 98.2 F (36.8 C)  TempSrc: Oral Oral Oral Oral  SpO2: 98% 95% 98% 100%  Weight:      Height:        Intake/Output Summary (Last 24 hours) at 04/29/2022 0819 Last data filed at 04/28/2022 1308 Gross per 24 hour  Intake 240 ml  Output 450 ml  Net -210 ml   Filed Weights   04/24/22 0132 04/25/22 0500 04/26/22 0500  Weight: 108.9 kg 102.1 kg 102 kg    Telemetry    Sinus rhythm - Personally Reviewed  ECG    None today - Personally Reviewed  Physical Exam    General: Comfortable, lying in bed Head: Atraumatic, normal size  Eyes: PEERLA, EOMI  Neck: Supple, normal JVD Cardiac: Normal S1, S2; RRR; no murmurs, rubs, or gallops Lungs: Clear to auscultation bilaterally Abd: Soft, nontender, no hepatomegaly  Ext: warm, no edema Musculoskeletal: No deformities, BUE and BLE strength normal and  equal Skin: Warm and dry, no rashes   Neuro: Alert and oriented to person, place, time, and situation, CNII-XII grossly intact, no focal deficits  Psych: Normal mood and affect   Labs    Chemistry Recent Labs  Lab 04/24/22 1211 04/25/22 0541 04/25/22 1150 04/27/22 0220 04/28/22 0236 04/29/22 0316  NA 141 140   < > 138 140 139  K 4.2 3.4*   < > 4.3 3.8 4.0  CL 107 107   < > 104 101 106  CO2 22 24   < > 26 28 24   GLUCOSE 159* 94   < > 99 108* 110*  BUN 16 19   < > 17 20 24*  CREATININE 1.41* 1.46*   < > 1.37* 1.62* 1.46*  CALCIUM 9.1 9.0   < > 9.3 9.7 9.4  PROT 7.1 6.9  --   --   --   --   ALBUMIN 3.2* 3.1*  --   --   --   --   AST 17 12*  --   --   --   --   ALT 12 11  --   --   --   --   ALKPHOS 48 45  --   --   --   --  BILITOT 0.6 0.6  --   --   --   --   GFRNONAA 59* 57*   < > >60 50* 57*  ANIONGAP 12 9   < > 8 11 9   < > = values in this interval not displayed.     Hematology Recent Labs  Lab 04/27/22 0220 04/28/22 0236 04/29/22 0316  WBC 11.7* 12.8* 13.6*  RBC 4.44 4.82 4.84  HGB 12.5* 13.8 13.5  HCT 41.0 43.6 44.3  MCV 92.3 90.5 91.5  MCH 28.2 28.6 27.9  MCHC 30.5 31.7 30.5  RDW 13.8 13.8 14.0  PLT 262 281 284    Cardiac EnzymesNo results for input(s): "TROPONINI" in the last 168 hours. No results for input(s): "TROPIPOC" in the last 168 hours.   BNP Recent Labs  Lab 04/24/22 0140 04/28/22 0236  BNP 3,089.4* 1,190.6*     DDimer No results for input(s): "DDIMER" in the last 168 hours.   Radiology    No results found.  Cardiac Studies   TTE 04/25/2022 IMPRESSIONS     1. Left ventricular ejection fraction, by estimation, is 25 to 30%. The  left ventricle has severely decreased function. The left ventricle  demonstrates global hypokinesis. The left ventricular internal cavity size  was mildly dilated. Left ventricular  diastolic parameters are consistent with Grade II diastolic dysfunction  (pseudonormalization).   2. Right  ventricular systolic function is normal. The right ventricular  size is normal.   3. The mitral valve is normal in structure. No evidence of mitral valve  regurgitation. No evidence of mitral stenosis.   4. The aortic valve is normal in structure. Aortic valve regurgitation is  not visualized. No aortic stenosis is present.   5. The inferior vena cava is normal in size with <50% respiratory  variability, suggesting right atrial pressure of 8 mmHg.   FINDINGS   Left Ventricle: Left ventricular ejection fraction, by estimation, is 25  to 30%. The left ventricle has severely decreased function. The left  ventricle demonstrates global hypokinesis. The left ventricular internal  cavity size was mildly dilated. There  is no left ventricular hypertrophy. Left ventricular diastolic parameters  are consistent with Grade II diastolic dysfunction (pseudonormalization).   Right Ventricle: The right ventricular size is normal. No increase in  right ventricular wall thickness. Right ventricular systolic function is  normal.   Left Atrium: Left atrial size was normal in size.   Right Atrium: Right atrial size was normal in size.   Pericardium: There is no evidence of pericardial effusion.   Mitral Valve: The mitral valve is normal in structure. No evidence of  mitral valve regurgitation. No evidence of mitral valve stenosis.   Tricuspid Valve: The tricuspid valve is normal in structure. Tricuspid  valve regurgitation is not demonstrated. No evidence of tricuspid  stenosis.   Aortic Valve: The aortic valve is normal in structure. Aortic valve  regurgitation is not visualized. No aortic stenosis is present. Aortic  valve mean gradient measures 3.0 mmHg. Aortic valve peak gradient measures  5.3 mmHg. Aortic valve area, by VTI  measures 3.04 cm.   Pulmonic Valve: The pulmonic valve was normal in structure. Pulmonic valve  regurgitation is not visualized. No evidence of pulmonic stenosis.    Aorta: The aortic root is normal in size and structure.   Venous: The inferior vena cava is normal in size with less than 50%  respiratory variability, suggesting right atrial pressure of 8 mmHg.   IAS/Shunts: No atrial level shunt   detected by color flow Doppler.   Patient Profile     56 y.o. male with newly diagnosed heart failure with reduced ejection fraction and cardiomyopathy  Assessment & Plan    Heart failure with reduced ejection fraction Dilated cardiomyopathy-etiology unknown Hypertension Tobacco abuse Elevated creatinine: Acute kidney injury versus CKD  Plan for heart catheterization today.  He is getting IV titration as well.  He is now able to lie flat. Post cath switch from Lasix 40 mg IV to Lasix 40 mg daily, add Aldactone 12.5 mg to his regimen as well.  He is aware of his newly diagnosed heart failure as well as his EF.  I increase his carvedilol to 12.5 mg twice daily yesterday. His goal-directed medical therapy so far is carvedilol 12.5 mg twice daily, losartan 50 mg daily, plan to add Aldactone 12.5 mg post cath and hopefully can add an SGLT inhibitor while inpatient if not this can be adjusted in the outpatient setting.  unfortunately there are some social barriers that may affect his care-we will recommend getting social work or care navigation on board to help this patient navigate these variants that have been identified that could cause problems in obtaining his medications and follow-ups.  He is aware of his heart catheterization today.  The patient understands that risks include but are not limited to stroke (1 in 1000), death (1 in 1000), kidney failure [usually temporary] (1 in 500), bleeding (1 in 200), allergic reaction [possibly serious] (1 in 200), and agrees to proceed.  Discussed the plan with the patient he is agreeable.  For questions or updates, please contact [facility] Please consult [email or web address] for contact info under  Cardiology/STEMI.      Signed, Rodnisha Blomgren, DO  04/29/2022, 8:19 AM    

## 2022-04-29 NOTE — Progress Notes (Signed)
Patients blood pressure this morning was 143/103, patient is NPO for procedure today.  per Ryann Atway,DO ok to give coreg and aspirin  with sip of water. Medications given.

## 2022-04-29 NOTE — H&P (View-Only) (Signed)
Progress Note  Patient Name: Hector Manning Date of Encounter: 04/29/2022  Primary Cardiologist: None   Subjective   Patient seen and examined at his bedside.  No further complaints this time. He is looking forward to heart catheterization today.  Inpatient Medications    Scheduled Meds:  carvedilol  12.5 mg Oral BID WC   enoxaparin (LOVENOX) injection  40 mg Subcutaneous Daily   folic acid  1 mg Oral Daily   furosemide  40 mg Intravenous BID   losartan  50 mg Oral Daily   multivitamin with minerals  1 tablet Oral Daily   nicotine  14 mg Transdermal Daily   potassium chloride  40 mEq Oral Daily   senna  1 tablet Oral BID   sodium chloride flush  3 mL Intravenous Q12H   thiamine  100 mg Oral Daily   Continuous Infusions:  sodium chloride 10 mL/hr at 04/29/22 0738   PRN Meds: acetaminophen **OR** acetaminophen, albuterol, benzonatate, melatonin   Vital Signs    Vitals:   04/28/22 1647 04/28/22 2109 04/29/22 0433 04/29/22 0740  BP: 123/75 (!) 142/84 (!) 147/91 (!) 143/103  Pulse: 73 70 70 68  Resp: 18 17    Temp: 98.3 F (36.8 C) 98.6 F (37 C) 98.2 F (36.8 C) 98.2 F (36.8 C)  TempSrc: Oral Oral Oral Oral  SpO2: 98% 95% 98% 100%  Weight:      Height:        Intake/Output Summary (Last 24 hours) at 04/29/2022 0819 Last data filed at 04/28/2022 1308 Gross per 24 hour  Intake 240 ml  Output 450 ml  Net -210 ml   Filed Weights   04/24/22 0132 04/25/22 0500 04/26/22 0500  Weight: 108.9 kg 102.1 kg 102 kg    Telemetry    Sinus rhythm - Personally Reviewed  ECG    None today - Personally Reviewed  Physical Exam    General: Comfortable, lying in bed Head: Atraumatic, normal size  Eyes: PEERLA, EOMI  Neck: Supple, normal JVD Cardiac: Normal S1, S2; RRR; no murmurs, rubs, or gallops Lungs: Clear to auscultation bilaterally Abd: Soft, nontender, no hepatomegaly  Ext: warm, no edema Musculoskeletal: No deformities, BUE and BLE strength normal and  equal Skin: Warm and dry, no rashes   Neuro: Alert and oriented to person, place, time, and situation, CNII-XII grossly intact, no focal deficits  Psych: Normal mood and affect   Labs    Chemistry Recent Labs  Lab 04/24/22 1211 04/25/22 0541 04/25/22 1150 04/27/22 0220 04/28/22 0236 04/29/22 0316  NA 141 140   < > 138 140 139  K 4.2 3.4*   < > 4.3 3.8 4.0  CL 107 107   < > 104 101 106  CO2 22 24   < > 26 28 24   GLUCOSE 159* 94   < > 99 108* 110*  BUN 16 19   < > 17 20 24*  CREATININE 1.41* 1.46*   < > 1.37* 1.62* 1.46*  CALCIUM 9.1 9.0   < > 9.3 9.7 9.4  PROT 7.1 6.9  --   --   --   --   ALBUMIN 3.2* 3.1*  --   --   --   --   AST 17 12*  --   --   --   --   ALT 12 11  --   --   --   --   ALKPHOS 48 45  --   --   --   --  BILITOT 0.6 0.6  --   --   --   --   GFRNONAA 59* 57*   < > >60 50* 57*  ANIONGAP 12 9   < > 8 11 9    < > = values in this interval not displayed.     Hematology Recent Labs  Lab 04/27/22 0220 04/28/22 0236 04/29/22 0316  WBC 11.7* 12.8* 13.6*  RBC 4.44 4.82 4.84  HGB 12.5* 13.8 13.5  HCT 41.0 43.6 44.3  MCV 92.3 90.5 91.5  MCH 28.2 28.6 27.9  MCHC 30.5 31.7 30.5  RDW 13.8 13.8 14.0  PLT 262 281 284    Cardiac EnzymesNo results for input(s): "TROPONINI" in the last 168 hours. No results for input(s): "TROPIPOC" in the last 168 hours.   BNP Recent Labs  Lab 04/24/22 0140 04/28/22 0236  BNP 3,089.4* 1,190.6*     DDimer No results for input(s): "DDIMER" in the last 168 hours.   Radiology    No results found.  Cardiac Studies   TTE 04/25/2022 IMPRESSIONS     1. Left ventricular ejection fraction, by estimation, is 25 to 30%. The  left ventricle has severely decreased function. The left ventricle  demonstrates global hypokinesis. The left ventricular internal cavity size  was mildly dilated. Left ventricular  diastolic parameters are consistent with Grade II diastolic dysfunction  (pseudonormalization).   2. Right  ventricular systolic function is normal. The right ventricular  size is normal.   3. The mitral valve is normal in structure. No evidence of mitral valve  regurgitation. No evidence of mitral stenosis.   4. The aortic valve is normal in structure. Aortic valve regurgitation is  not visualized. No aortic stenosis is present.   5. The inferior vena cava is normal in size with <50% respiratory  variability, suggesting right atrial pressure of 8 mmHg.   FINDINGS   Left Ventricle: Left ventricular ejection fraction, by estimation, is 25  to 30%. The left ventricle has severely decreased function. The left  ventricle demonstrates global hypokinesis. The left ventricular internal  cavity size was mildly dilated. There  is no left ventricular hypertrophy. Left ventricular diastolic parameters  are consistent with Grade II diastolic dysfunction (pseudonormalization).   Right Ventricle: The right ventricular size is normal. No increase in  right ventricular wall thickness. Right ventricular systolic function is  normal.   Left Atrium: Left atrial size was normal in size.   Right Atrium: Right atrial size was normal in size.   Pericardium: There is no evidence of pericardial effusion.   Mitral Valve: The mitral valve is normal in structure. No evidence of  mitral valve regurgitation. No evidence of mitral valve stenosis.   Tricuspid Valve: The tricuspid valve is normal in structure. Tricuspid  valve regurgitation is not demonstrated. No evidence of tricuspid  stenosis.   Aortic Valve: The aortic valve is normal in structure. Aortic valve  regurgitation is not visualized. No aortic stenosis is present. Aortic  valve mean gradient measures 3.0 mmHg. Aortic valve peak gradient measures  5.3 mmHg. Aortic valve area, by VTI  measures 3.04 cm.   Pulmonic Valve: The pulmonic valve was normal in structure. Pulmonic valve  regurgitation is not visualized. No evidence of pulmonic stenosis.    Aorta: The aortic root is normal in size and structure.   Venous: The inferior vena cava is normal in size with less than 50%  respiratory variability, suggesting right atrial pressure of 8 mmHg.   IAS/Shunts: No atrial level shunt  detected by color flow Doppler.   Patient Profile     56 y.o. male with newly diagnosed heart failure with reduced ejection fraction and cardiomyopathy  Assessment & Plan    Heart failure with reduced ejection fraction Dilated cardiomyopathy-etiology unknown Hypertension Tobacco abuse Elevated creatinine: Acute kidney injury versus CKD  Plan for heart catheterization today.  He is getting IV titration as well.  He is now able to lie flat. Post cath switch from Lasix 40 mg IV to Lasix 40 mg daily, add Aldactone 12.5 mg to his regimen as well.  He is aware of his newly diagnosed heart failure as well as his EF.  I increase his carvedilol to 12.5 mg twice daily yesterday. His goal-directed medical therapy so far is carvedilol 12.5 mg twice daily, losartan 50 mg daily, plan to add Aldactone 12.5 mg post cath and hopefully can add an SGLT inhibitor while inpatient if not this can be adjusted in the outpatient setting.  unfortunately there are some social barriers that may affect his care-we will recommend getting social work or care navigation on board to help this patient navigate these variants that have been identified that could cause problems in obtaining his medications and follow-ups.  He is aware of his heart catheterization today.  The patient understands that risks include but are not limited to stroke (1 in 1000), death (1 in 1000), kidney failure [usually temporary] (1 in 500), bleeding (1 in 200), allergic reaction [possibly serious] (1 in 200), and agrees to proceed.  Discussed the plan with the patient he is agreeable.  For questions or updates, please contact [facility] Please consult [email or web address] for contact info under  Cardiology/STEMI.      Signed, Thomasene Ripple, DO  04/29/2022, 8:19 AM

## 2022-04-29 NOTE — Discharge Summary (Cosign Needed Addendum)
Name: Hector Manning Hector Manning MRN: [id number] DOB: 11/10/1965 56 y.o. PCP: Patient, No Pcp Per  Date of Admission: 04/24/2022  1:32 AM Date of Discharge: 04/29/2022 Attending Physician: Dr. Cleda DaubE. Hoffman  Discharge Diagnosis: Acute Hypoxemic Respiratory Failure, Resolved HFrEF NYHA Class I, Stage C Hypertension CKD Stage 3a Iron Deficiency Anemia Tobacco Use Disorder Alcohol Use Disorder    Discharge Medications: Allergies as of 04/29/2022   No Known Allergies      Medication List     STOP taking these medications    colchicine 0.6 MG tablet   DAYQUIL MULTI-SYMPTOM COLD/FLU PO   ibuprofen 200 MG tablet Commonly known as: ADVIL       TAKE these medications    acetaminophen 500 MG tablet Commonly known as: TYLENOL Take 1,000 mg by mouth every 4 (four) hours as needed for mild pain.   Alka-Seltzer Pls Sinus & Cough 10-5-325 MG Caps Generic drug: DM-Phenylephrine-Acetaminophen Take 2 capsules by mouth every 6 (six) hours as needed (cough / cold).   carvedilol 12.5 MG tablet Commonly known as: COREG Take 1 tablet (12.5 mg total) by mouth 2 (two) times daily with a meal.   empagliflozin 10 MG Tabs tablet Commonly known as: JARDIANCE Take 1 tablet (10 mg total) by mouth daily before breakfast.   ferrous sulfate 325 (65 FE) MG tablet Take 1 tablet (325 mg total) by mouth daily with breakfast. Start taking on: April 30, 2022   furosemide 40 MG tablet Commonly known as: LASIX Take 1 tablet (40 mg total) by mouth 2 (two) times daily.   losartan 50 MG tablet Commonly known as: COZAAR Take 1 tablet (50 mg total) by mouth daily. Start taking on: April 30, 2022   spironolactone 25 MG tablet Commonly known as: ALDACTONE Take 0.5 tablets (12.5 mg total) by mouth daily. Start taking on: April 30, 2022   Visine 0.025-0.3 % ophthalmic solution Generic drug: naphazoline-pheniramine Place 2 drops into both eyes 2 (two) times daily as needed for eye irritation.         Disposition and follow-up:   Mr.Hector Manning [facility] Good [facility].  At the hospital follow up visit please address:  1.  Follow-up:  a. Evaluate for symptoms of heart failure and any issues with medications. Also check BP and renal function with addition of new medications.     b. Consider switching to entresto if able to get patient assistance.   c. Consider decreasing iron to 3 times weekly iron if he is having side effects.   2.  Labs / imaging needed at time of follow-up: BMP  3.  Pending labs/ test needing follow-up: No  4.  Medication Changes Started GDMT with lasix, carvedilol, losartan, empagliflozin, and spironolactone. Also started PO iron.   Follow-up Appointments:  Follow-up Information     Willette Clusterogers, Tyler, MD. Go on 05/09/2022.   Why: You are scheduled for a hospital follow up on May 09, 2022 at 10:15 am. Contact information: [address] [place] [address] [phone]         [facility]. Go in 13 day(s).   Specialty: Cardiology Why: Hospital follow up PLEASE bring a current medication list to appointment FREE valet parking, Entrance C, off [address] Contact information: [address] [id number] [place] [id number] [phone]        Thomasene Rippleobb, Kardie, DO. Schedule an appointment as soon as possible for a visit.  Specialty: Cardiology Contact information: [address] [place] [address] [phone]                 Hospital Course by problem list: Hector Manning is a 56 y.o. with a pertinent PMH of tobacco and alcohol use disorder who presented with progressive dyspnea and is admitted for acute hypoxic respiratory failure secondary to newly diagnosed HFrEF (EF 25-30%).   Acute Hypoxemic Respiratory Failure HFrEF NYHA class I, Stage C HTN Healthcare naive patient presented with progressive  dyspnea, lower extremity edema, and productive cough. 20 year smoking history with some benefit from inhalers and nebulizers. Bicarb initially low but normalized. BNP also elevated at 3000 with mild troponin elevation (38-51-28). CXR showed cardiomegaly with pulmonary congestion and mild interstitial edema/infiltrates. CTA chest showed no evidence of PE but trace bilateral pleural effusions and groundglass opacity in the left upper lobe.  Also found to have iron deficiency anemia and normal phosphorus.  He was requiring oxygen and improved with IV diuresis, he did not require supplemental oxygen for the majority of his admission. Blood pressure elevated on admission with systolics up to 160 and diastolics 90-100.  Initial presentation thought to be 2/2 undiagnosed COPD and/or heart failure. He was started on antibiotics in addition to the IV diuresis and DuoNebs.  Antibiotics were stopped after negative Pro-Cal x2.  Echocardiogram showed severely reduced ejection fraction of 25 to 30% with global hypokinesis of the left ventricle.  Heart failure secondary to untreated hypertension.  Cardiology was consulted for ischemic evaluation.  Left/right heart cath on 04/29/2022 without obstructive coronary disease or elevated right heart pressures.  He was started on GDMT with Lasix 40 mg twice daily, losartan 50 mg daily, carvedilol 12.5 mg twice daily, spironolactone 12.5 mg daily, and empagliflozin 10 mg daily.  We will consider switching losartan to Entresto if able to get patient assistance outpatient.  AKI on CKD 3A Patient presented with creatinine of 1.59.  Prior creatinine of 1.04 in 08/2021.  After diuresis his creatinine dropped to 1.46.  He fluctuated between 1.4 and 1.6 during his admission and is likely his baseline.  Likely secondary to untreated hypertension and heart failure.  Normocytic anemia Iron deficiency anemia Patient presented with a hemoglobin of 12 without any known source of bleeding.   Further work-up showed hypoproliferation with a low reticulocyte index at 1.55.  Iron studies showed iron of 31, saturation of 8, and ferritin of 24.  Smear was unremarkable, folate was normal.  Given 250 mg IV iron and started on ferrous sulfate 325 mg daily.  This may be lowered to 3 times a week outpatient.  His hemoglobin did increase to 13 during his admission.  He will also need a colonoscopy as he has not had one.  Tobacco use disorder Alcohol use disorder Tobacco use of half a pack a day for 20 years.  Alcohol use of 2X 40 ounce beers a day and 25 ounces of liquor on the weekends.  No history of alcohol withdrawal.  Last drink was 2 weeks prior to admission.  He had some tremors but otherwise denied any more symptoms of withdrawal.  CIWA during his admission were consistently 0.  TOC was consulted for alcohol cessation.  He was given nicotine patches as well.  We will continue tobacco and alcohol cessation discussions outpatient.   Discharge Subjective: Patient is doing well today without any new or worsening complaints. No issues post heart cath.  Discharge Exam:   BP 136/86 (  BP Location: Right Arm)   Pulse 69   Temp 97.7 F (36.5 C) (Oral)   Resp 18   Ht  (1.803 m)   Wt 102 kg   SpO2 100%   BMI 31.36 kg/m  Constitutional: well-appearing male sitting in bed, in no acute distress Cardiovascular: regular rate and rhythm Pulmonary/Chest: normal work of breathing on room air, lungs clear to auscultation bilaterally MSK: normal bulk and tone, no LE edema Neurological: alert & oriented x 3 Skin: warm and dry  Pertinent Labs, Studies, and Procedures:     Latest Ref Rng & Units 04/29/2022    3:16 AM 04/28/2022    2:36 AM 04/27/2022    2:20 AM  CBC  WBC 4.0 - 10.5 K/uL 13.6  12.8  11.7   Hemoglobin 13.0 - 17.0 g/dL 14.7  82.9  56.2   Hematocrit 39.0 - 52.0 % 44.3  43.6  41.0   Platelets 150 - 400 K/uL 284  281  262        Latest Ref Rng & Units 04/29/2022    3:16 AM  04/28/2022    2:36 AM 04/27/2022    2:20 AM  CMP  Glucose 70 - 99 mg/dL 130  865  99   BUN 6 - 20 mg/dL Creatinine 0.61 - 1.24 mg/dL 7.84  6.96  2.95   Sodium 135 - 145 mmol/L 139  140  138   Potassium 3.5 - 5.1 mmol/L 4.0  3.8  4.3   Chloride 98 - 111 mmol/L 106  101  104   CO2 22 - 32 mmol/L Calcium 8.9 - 10.3 mg/dL 9.4  9.7  9.3     ECHOCARDIOGRAM COMPLETE  Result Date: 04/25/2022    ECHOCARDIOGRAM REPORT   Patient Name:   QUENTEZ LOBER Date of Exam: 04/25/2022 Medical Rec #:  [id number]     Height:       71.0 in Accession #:    [id number]    Weight:       225.1 lb Date of Birth:  1966/01/06      BSA:          2.217 m Patient Age:    56 years      BP:           160/101 mmHg Patient Gender: M             HR:           90 bpm. Exam Location:  Inpatient Procedure: 2D Echo, Cardiac Doppler and Color Doppler Indications:    Dyspnea  History:        Patient has no prior history of Echocardiogram examinations.  Sonographer:    Gaynell Face Referring Phys: [id number] ERIK C HOFFMAN IMPRESSIONS  1. Left ventricular ejection fraction, by estimation, is 25 to 30%. The left ventricle has severely decreased function. The left ventricle demonstrates global hypokinesis. The left ventricular internal cavity size was mildly dilated. Left ventricular diastolic parameters are consistent with Grade II diastolic dysfunction (pseudonormalization).  2. Right ventricular systolic function is normal. The right ventricular size is normal.  3. The mitral valve is normal in structure. No evidence of mitral valve regurgitation. No evidence of mitral stenosis.  4. The aortic valve is normal in structure. Aortic valve regurgitation is not visualized. No aortic stenosis is present.  5. The inferior vena cava is normal in size with <50% respiratory variability, suggesting  right atrial pressure of 8 mmHg. FINDINGS  Left Ventricle: Left ventricular ejection fraction, by estimation, is 25 to 30%. The left  ventricle has severely decreased function. The left ventricle demonstrates global hypokinesis. The left ventricular internal cavity size was mildly dilated. There is no left ventricular hypertrophy. Left ventricular diastolic parameters are consistent with Grade II diastolic dysfunction (pseudonormalization). Right Ventricle: The right ventricular size is normal. No increase in right ventricular wall thickness. Right ventricular systolic function is normal. Left Atrium: Left atrial size was normal in size. Right Atrium: Right atrial size was normal in size. Pericardium: There is no evidence of pericardial effusion. Mitral Valve: The mitral valve is normal in structure. No evidence of mitral valve regurgitation. No evidence of mitral valve stenosis. Tricuspid Valve: The tricuspid valve is normal in structure. Tricuspid valve regurgitation is not demonstrated. No evidence of tricuspid stenosis. Aortic Valve: The aortic valve is normal in structure. Aortic valve regurgitation is not visualized. No aortic stenosis is present. Aortic valve mean gradient measures 3.0 mmHg. Aortic valve peak gradient measures 5.3 mmHg. Aortic valve area, by VTI measures 3.04 cm. Pulmonic Valve: The pulmonic valve was normal in structure. Pulmonic valve regurgitation is not visualized. No evidence of pulmonic stenosis. Aorta: The aortic root is normal in size and structure. Venous: The inferior vena cava is normal in size with less than 50% respiratory variability, suggesting right atrial pressure of 8 mmHg. IAS/Shunts: No atrial level shunt detected by color flow Doppler.  LEFT VENTRICLE PLAX 2D LVIDd:         6.20 cm      Diastology LVIDs:         5.60 cm      LV e' medial:    4.08 cm/s LV PW:         1.20 cm      LV E/e' medial:  21.2 LV IVS:        1.00 cm      LV e' lateral:   7.29 cm/s LVOT diam:     2.30 cm      LV E/e' lateral: 11.9 LV SV:         63 LV SV Index:   28 LVOT Area:     4.15 cm  LV Volumes (MOD) LV vol d, MOD A2C:  161.0 ml LV vol d, MOD A4C: 250.0 ml LV vol s, MOD A2C: 117.0 ml LV vol s, MOD A4C: 186.0 ml LV SV MOD A2C:     44.0 ml LV SV MOD A4C:     250.0 ml LV SV MOD BP:      54.1 ml RIGHT VENTRICLE TAPSE (M-mode): 1.6 cm LEFT ATRIUM              Index        RIGHT ATRIUM           Index LA diam:        4.20 cm  1.89 cm/m   RA Area:     15.95 cm LA Vol (A2C):   94.9 ml  42.80 ml/m  RA Volume:   35.35 ml  15.94 ml/m LA Vol (A4C):   108.0 ml 48.71 ml/m LA Biplane Vol: 103.0 ml 46.46 ml/m  AORTIC VALVE AV Area (Vmax):    3.16 cm AV Area (Vmean):   3.08 cm AV Area (VTI):     3.04 cm AV Vmax:           115.00 cm/s AV Vmean:  86.200 cm/s AV VTI:            0.208 m AV Peak Grad:      5.3 mmHg AV Mean Grad:      3.0 mmHg LVOT Vmax:         87.50 cm/s LVOT Vmean:        63.800 cm/s LVOT VTI:          0.152 m LVOT/AV VTI ratio: 0.73  AORTA Ao Root diam: 3.50 cm Ao Asc diam:  3.60 cm MITRAL VALVE MV Area (PHT): 5.06 cm    SHUNTS MV Decel Time: 150 msec    Systemic VTI:  0.15 m MV E velocity: 86.50 cm/s  Systemic Diam: 2.30 cm MV A velocity: 55.70 cm/s MV E/A ratio:  1.55 Donato Schultz MD Electronically signed by Donato Schultz MD Signature Date/Time: 04/25/2022/3:15:57 PM    Final    US RENAL  Result Date: 04/24/2022 CLINICAL DATA:  Acute kidney injury EXAM: RENAL / URINARY TRACT ULTRASOUND COMPLETE COMPARISON:  None Available. FINDINGS: Right Kidney: Renal measurements: 11 x 4.2 x 4.3 cm = volume: 104 mL. Echogenicity within normal limits. No mass or hydronephrosis visualized. Left Kidney: Renal measurements: 11.1 x 5.3 x 4.7 cm = volume: 144 mL. Echogenicity within normal limits. No mass or hydronephrosis visualized. Bladder: Appears normal for degree of bladder distention. Other: None. IMPRESSION: Normal examination.  No hydronephrosis. Electronically Signed   By: Burman Nieves M.D.   On: 04/24/2022 21:38   CT Angio Chest PE W and/or Wo Contrast  Result Date: 04/24/2022 CLINICAL DATA:  Shortness of breath,  suspected pulmonary embolism EXAM: CT ANGIOGRAPHY CHEST WITH CONTRAST TECHNIQUE: Multidetector CT imaging of the chest was performed using the standard protocol during bolus administration of intravenous contrast. Multiplanar CT image reconstructions and MIPs were obtained to evaluate the vascular anatomy. RADIATION DOSE REDUCTION: This exam was performed according to the departmental dose-optimization program which includes automated exposure control, adjustment of the mA and/or kV according to patient size and/or use of iterative reconstruction technique. CONTRAST:  71mL OMNIPAQUE IOHEXOL 350 MG/ML SOLN COMPARISON:  None Available. FINDINGS: Cardiovascular: Heart size upper limits normal. No pericardial effusion. Satisfactory opacification of pulmonary arteries noted, and there is no evidence of pulmonary emboli. Scattered coronary calcifications. Fair contrast opacification of the thoracic aorta, with no dissection, aneurysm, or stenosis. Classic 3-vessel brachiocephalic arterial origin anatomy without proximal stenosis. Mediastinum/Nodes: No mediastinal mass or adenopathy. Lungs/Pleura: Trace bilateral pleural effusions. No pneumothorax. Dependent atelectasis posteriorly in the lung bases. Poorly marginated ground-glass opacity in the left suprahilar region and posterior left upper lobe favor infectious/inflammatory etiology. Upper Abdomen: 1.1 cm 7 HU hepatic segment 5 lesion probably cyst; no follow-up indicated in the absence of a history of primary carcinoma. No acute findings. Musculoskeletal: Exuberant right sternoclavicular joint hypertrophy. No acute findings. Review of the MIP images confirms the above findings. IMPRESSION: 1. Negative for acute PE or thoracic aortic dissection. 2. Trace bilateral pleural effusions. 3. Left upper lobe ground-glass opacity favoring infectious/inflammatory etiology. 4. Coronary calcifications. The severity of coronary artery disease and any potential stenosis cannot be  assessed on this non-gated CT examination. Electronically Signed   By: Corlis Leak M.D.   On: 04/24/2022 08:37   DG Chest Port 1 View  Result Date: 04/24/2022 CLINICAL DATA:  Shortness of breath. EXAM: PORTABLE CHEST 1 VIEW COMPARISON:  None. FINDINGS: The heart is enlarged the mediastinal contour is within normal limits. The pulmonary vasculature is mildly distended. Interstitial prominence is present  bilaterally. No effusion or pneumothorax. No acute osseous abnormality. IMPRESSION: 1. Cardiomegaly with pulmonary vascular congestion. 2. Mild interstitial prominence bilaterally, possible edema or infiltrate. Electronically Signed   By: Thornell Sartorius M.D.   On: 04/24/2022 02:13     Discharge Instructions: Discharge Instructions     Call MD for:  difficulty breathing, headache or visual disturbances   Complete by: As directed    Call MD for:  extreme fatigue   Complete by: As directed    Call MD for:  persistant nausea and vomiting   Complete by: As directed    Call MD for:  redness, tenderness, or signs of infection (pain, swelling, redness, odor or green/yellow discharge around incision site)   Complete by: As directed    Call MD for:  severe uncontrolled pain   Complete by: As directed    Call MD for:  temperature >100.4   Complete by: As directed    Diet - low sodium heart healthy   Complete by: As directed    Increase activity slowly   Complete by: As directed        Signed: Rocky Morel, DO 04/29/2022, 4:14 PM   Pager: [phone]

## 2022-04-30 ENCOUNTER — Encounter (HOSPITAL_COMMUNITY): Payer: Self-pay | Admitting: Cardiovascular Disease

## 2022-05-08 NOTE — Progress Notes (Incomplete)
   [facility] Transitions of Care Clinic Heart Failure Pharmacist Encounter  PCP: Patient, No Pcp Per PCP-Cardiologist: None  HPI:  Patient presented on 10/26 with worsening dyspnea, lower extremity edema and productive cough. Patient has a 20 year smoking history. BNP elevated at 3000, CXR showed pulmonary congestion with interstitial edema confirmed by CT. Was initially requiring oxygen and improved with IV diuresis with furosemide. Echo done 04/25/22 showed EF 25 to 30% with hypokinesis of the left ventricle. Left/right heart cath done on 04/29/22 was without obstructive coronary disease. He was started on GDMT of losartan 50 mg daily, carvedilol 12.5 mg daily, spironolactone 12.5 mg daily and empagliflozin 10 mg daily. He was also started on furosemide 40 mg BID. He did have AKI as well, Cr 1.59 on presentation with baseline of 1.04 in 08/2021, Cr fluctuated between 1.4 and 1.6 during his visit which is believed to be his new baseline.   Today, Hector Manning presents to the Heart Failure [facility] for follow up.   Shortness of breath/dyspnea on exertion? {YES NO:22349}  Orthopnea/PND? {YES NO:22349} Edema? {YES NO:22349} Lightheadedness/dizziness? {YES NO:22349} Daily weights at home? {YES NO:22349} Blood pressure/heart rate monitoring at home? {YES [id number] Following low-sodium/fluid-restricted diet? {YES NO:22349} Taking medications as prescribed? {YES NO:22349}  HF Medications: Diuretic: furosemide 40 mg BID Beta Blocker: carvedilol 12.5 BID ACE/ARB/ARNI: losartan 50 mg daily MRA: spironolactone 12.5 mg daily SGLT2i: empagliflozin 10 mg daily  Has the patient been experiencing any side effects to the medications prescribed?  {YES NO:22349}  Does the patient have any problems obtaining medications due to transportation or finances?   {YES NO:22349}  Understanding of regimen: {excellent/good/fair/poor:19665} Understanding of indications:  {excellent/good/fair/poor:19665} Potential of compliance: {excellent/good/fair/poor:19665} Patient understands to avoid NSAIDs. Patient understands to avoid decongestants.   Pertinent Lab Values: Serum creatinine ***, BUN ***, Potassium ***, Sodium ***, BNP ***, Magnesium ***, Digoxin ***   Vital Signs: Weight: *** lbs (discharge weight: *** lbs) Blood pressure: ***  Heart rate: ***   Medication Assistance / Insurance Benefits Check: Does the patient have prescription insurance?  No Type of insurance plan: ***  Does the patient qualify for medication assistance through manufacturers or grants?   {Yes/No/Pending:24180} Eligible grants and/or patient assistance programs: *** Medication assistance applications in progress: ***  Medication assistance applications approved: *** Approved medication assistance renewals will be completed by: ***  Outpatient Pharmacy:  Current outpatient pharmacy: *** Was the [facility] pharmacy used to supply discharge medications? yes  If TOC pharmacy was used, were the refills transferred out to current pharmacy yet? {YES NO:22349}  Is the patient willing to transition their outpatient pharmacy to utilize a [facility] outpatient pharmacy with or without mail order?   {Yes/No/Pending:24180}  Assessment: 1) Chronic ***systolic CHF (EF ***), due to ***. NYHA class *** symptoms. -   Plan: 1) Medication changes: -   2) Patient Assistance: -  3) Follow up: - Next appointment with *** on ***

## 2022-05-09 ENCOUNTER — Telehealth (HOSPITAL_COMMUNITY): Payer: Self-pay | Admitting: *Deleted

## 2022-05-09 ENCOUNTER — Ambulatory Visit: Payer: Self-pay

## 2022-05-09 ENCOUNTER — Inpatient Hospital Stay (HOSPITAL_COMMUNITY): Admit: 2022-05-09 | Discharge: 2022-05-09 | Disposition: A | Discharge status: Home / Self Care | Payer: Self-pay

## 2022-05-09 NOTE — Telephone Encounter (Signed)
Heart Failure Nurse Navigator Progress Note   Unable to leave appointment reminder message for 11 am on 05/09/22.  Hector Manning, BSN, Scientist, clinical (histocompatibility and immunogenetics) Only

## 2023-05-30 ENCOUNTER — Emergency Department (HOSPITAL_COMMUNITY): Payer: Self-pay

## 2023-05-30 ENCOUNTER — Emergency Department (HOSPITAL_COMMUNITY)
Admission: EM | Admit: 2023-05-30 | Discharge: 2023-05-31 | Disposition: A | Discharge status: Home / Self Care | Payer: Self-pay | Attending: Emergency Medicine | Admitting: Emergency Medicine

## 2023-05-30 ENCOUNTER — Encounter (HOSPITAL_COMMUNITY): Payer: Self-pay | Admitting: Emergency Medicine

## 2023-05-30 DIAGNOSIS — M7989 Other specified soft tissue disorders: Secondary | ICD-10-CM | POA: Insufficient documentation

## 2023-05-30 DIAGNOSIS — R0789 Other chest pain: Secondary | ICD-10-CM | POA: Insufficient documentation

## 2023-05-30 DIAGNOSIS — R0602 Shortness of breath: Secondary | ICD-10-CM | POA: Insufficient documentation

## 2023-05-30 LAB — CBC
HCT: 44.1 % (ref 39.0–52.0)
Hemoglobin: 13.8 g/dL (ref 13.0–17.0)
MCH: 28.1 pg (ref 26.0–34.0)
MCHC: 31.3 g/dL (ref 30.0–36.0)
MCV: 89.8 fL (ref 80.0–100.0)
Platelets: 136 10*3/uL — ABNORMAL LOW (ref 150–400)
RBC: 4.91 MIL/uL (ref 4.22–5.81)
RDW: 16.4 % — ABNORMAL HIGH (ref 11.5–15.5)
WBC: 6.3 10*3/uL (ref 4.0–10.5)
nRBC: 0 % (ref 0.0–0.2)

## 2023-05-30 NOTE — ED Provider Notes (Signed)
[facility] EMERGENCY DEPARTMENT AT [facility] Provider Note   CSN: [id number] Arrival date & time: 05/30/23  2250     History {Add pertinent medical, surgical, social history, OB history to HPI:1} Chief Complaint  Patient presents with   Chest Pain    Hector Manning is a 56 y.o. male.  HPI     This is a 57 year old male who presents with chest pain.  Patient reports 4-hour history of pressure-like chest pain.  Denies exertional symptoms.  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  Has some shortness of breath.  No recent fever or cough.  Has had some increased lower extremity swelling.  Home Medications Prior to Admission medications   Medication Sig Start Date End Date Taking? Authorizing Provider  acetaminophen (TYLENOL) 500 MG tablet Take 1,000 mg by mouth every 4 (four) hours as needed for mild pain.    [provider]  carvedilol (COREG) 12.5 MG tablet Take 1 tablet (12.5 mg total) by mouth 2 (two) times daily with a meal. 04/29/22   Rocky Morel, DO  DM-Phenylephrine-Acetaminophen (ALKA-SELTZER PLS SINUS & COUGH) 10-5-325 MG CAPS Take 2 capsules by mouth every 6 (six) hours as needed (cough / cold).    [provider]  empagliflozin (JARDIANCE) 10 MG TABS tablet Take 1 tablet (10 mg total) by mouth daily before breakfast. 04/29/22   Rocky Morel, DO  ferrous sulfate 325 (65 FE) MG tablet Take 1 tablet (325 mg total) by mouth daily with breakfast. 04/30/22   Rocky Morel, DO  furosemide (LASIX) 40 MG tablet Take 1 tablet (40 mg total) by mouth 2 (two) times daily. 04/29/22   Rocky Morel, DO  losartan (COZAAR) 50 MG tablet Take 1 tablet (50 mg total) by mouth daily. 04/30/22   Rocky Morel, DO  naphazoline-pheniramine (VISINE) 0.025-0.3 % ophthalmic solution Place 2 drops into both eyes 2 (two) times daily as needed for eye irritation.    [provider]  spironolactone (ALDACTONE) 25 MG tablet Take 0.5  tablets (12.5 mg total) by mouth daily. 04/30/22   Rocky Morel, DO      Allergies    Patient has no known allergies.    Review of Systems   Review of Systems  Constitutional:  Negative for fever.  Respiratory:  Positive for shortness of breath. Negative for cough.   Cardiovascular:  Positive for chest pain and leg swelling.  All other systems reviewed and are negative.   Physical Exam Updated Vital Signs BP (!) 163/114   Pulse (!) 120   Temp (!) 97.5 F (36.4 C) (Oral)   Resp 18   SpO2 98%  Physical Exam Vitals and nursing note reviewed.  Constitutional:      Appearance: He is well-developed. He is not ill-appearing.  HENT:     Head: Normocephalic and atraumatic.  Eyes:     Pupils: Pupils are equal, round, and reactive to light.  Cardiovascular:     Rate and Rhythm: Normal rate and regular rhythm.     Heart sounds: Normal heart sounds. No murmur heard. Pulmonary:     Effort: Pulmonary effort is normal. No respiratory distress.     Breath sounds: Normal breath sounds. No wheezing.  Abdominal:     General: Bowel sounds are normal.     Palpations: Abdomen is soft.     Tenderness: There is no abdominal tenderness. There is no rebound.  Musculoskeletal:     Cervical back: Neck supple.  Right lower leg: Edema present.     Left lower leg: Edema present.  Lymphadenopathy:     Cervical: No cervical adenopathy.  Skin:    General: Skin is warm and dry.  Neurological:     Mental Status: He is alert and oriented to person, place, and time.  Psychiatric:        Mood and Affect: Mood normal.     ED Results / Procedures / Treatments   Labs (all labs ordered are listed, but only abnormal results are displayed) Labs Reviewed  BASIC METABOLIC PANEL  CBC  TROPONIN I (HIGH SENSITIVITY)    EKG EKG Interpretation Date/Time:  Saturday May 30 2023 22:56:53 EST Ventricular Rate:  123 PR Interval:  169 QRS Duration:  93 QT Interval:  320 QTC Calculation: 458 R  Axis:   -67  Text Interpretation: Ectopic atrial tachycardia, unifocal Left atrial enlargement Left anterior fascicular block Nonspecific T abnormalities, lateral leads no stemi Confirmed by Ross Marcus ([id number]) on 05/30/2023 11:00:41 PM  Radiology No results found.  Procedures Procedures  {Document cardiac monitor, telemetry assessment procedure when appropriate:1}  Medications Ordered in ED Medications - No data to display  ED Course/ Medical Decision Making/ A&P   {   Click here for ABCD2, HEART and other calculatorsREFRESH Note before signing :1}                              Medical Decision Making Amount and/or Complexity of Data Reviewed Labs: ordered. Radiology: ordered.   ***  {Document critical care time when appropriate:1} {Document review of labs and clinical decision tools ie heart score, Chads2Vasc2 etc:1}  {Document your independent review of radiology images, and any outside records:1} {Document your discussion with family members, caretakers, and with consultants:1} {Document social determinants of health affecting pt's care:1} {Document your decision making why or why not admission, treatments were needed:1} Final Clinical Impression(s) / ED Diagnoses Final diagnoses:  None    Rx / DC Orders ED Discharge Orders     None

## 2023-05-30 NOTE — ED Triage Notes (Signed)
Pt here from home with c/o left sided chest pain ,no n/v with some slight sob , pt received 324 mg asa from ems

## 2023-05-30 NOTE — ED Notes (Signed)
Pt has not any of his daily meds in approx 5 to 6 months

## 2023-05-31 LAB — TROPONIN I (HIGH SENSITIVITY)
Troponin I (High Sensitivity): 31 ng/L — ABNORMAL HIGH (ref <18)
Troponin I (High Sensitivity): 31 ng/L — ABNORMAL HIGH (ref <18)

## 2023-05-31 LAB — BASIC METABOLIC PANEL
Anion gap: 6 (ref 5–15)
BUN: 17 mg/dL (ref 6–20)
CO2: 21 mmol/L — ABNORMAL LOW (ref 22–32)
Calcium: 9.1 mg/dL (ref 8.9–10.3)
Chloride: 110 mmol/L (ref 98–111)
Creatinine, Ser: 1.2 mg/dL (ref 0.61–1.24)
GFR, Estimated: >60 mL/min (ref >60)
Glucose, Bld: 88 mg/dL (ref 70–99)
Potassium: 5 mmol/L (ref 3.5–5.1)
Sodium: 137 mmol/L (ref 135–145)

## 2023-05-31 LAB — BRAIN NATRIURETIC PEPTIDE: B Natriuretic Peptide: 2811.8 pg/mL — ABNORMAL HIGH (ref 0.0–100.0)

## 2023-05-31 MED ORDER — FUROSEMIDE 10 MG/ML IJ SOLN
40.0000 mg | Freq: Once | INTRAMUSCULAR | Status: AC
  Administered 2023-05-31: 40 mg via INTRAVENOUS
  Filled 2023-05-31: qty 4

## 2023-05-31 MED ORDER — FUROSEMIDE 40 MG PO TABS: 40.0000 mg | ORAL_TABLET | Freq: Two times a day (BID) | ORAL | 3 refills | Status: DC

## 2023-05-31 NOTE — ED Notes (Signed)
Pt ambulated with this RN, pulse ox remained >94% on RA

## 2023-05-31 NOTE — Discharge Instructions (Signed)
You were seen today for chest pain.  Your workup today is largely reassuring.  You need to follow-up with cardiology as an outpatient.  Restart your Lasix daily given your leg swelling.  If you have new or worsening symptoms, you should be reevaluated.

## 2023-05-31 NOTE — ED Notes (Signed)
Patient verbalizes understanding of discharge instructions. Opportunity for questioning and answers were provided. Armband removed by staff, pt discharged from ED. Ambulated out to lobby  

## 2023-07-18 ENCOUNTER — Inpatient Hospital Stay (HOSPITAL_COMMUNITY): Payer: 59

## 2023-07-18 ENCOUNTER — Inpatient Hospital Stay (HOSPITAL_COMMUNITY)
Admission: EM | Admit: 2023-07-18 | Discharge: 2023-07-23 | DRG: 291 | Disposition: A | Discharge status: Home / Self Care | Payer: 59 | Attending: Family Medicine | Admitting: Family Medicine

## 2023-07-18 ENCOUNTER — Other Ambulatory Visit: Payer: Self-pay

## 2023-07-18 ENCOUNTER — Encounter (HOSPITAL_COMMUNITY): Payer: Self-pay

## 2023-07-18 ENCOUNTER — Emergency Department (HOSPITAL_COMMUNITY): Payer: 59

## 2023-07-18 DIAGNOSIS — I5041 Acute combined systolic (congestive) and diastolic (congestive) heart failure: Secondary | ICD-10-CM

## 2023-07-18 DIAGNOSIS — D631 Anemia in chronic kidney disease: Secondary | ICD-10-CM | POA: Diagnosis present

## 2023-07-18 DIAGNOSIS — D6959 Other secondary thrombocytopenia: Secondary | ICD-10-CM | POA: Diagnosis present

## 2023-07-18 DIAGNOSIS — Z1152 Encounter for screening for COVID-19: Secondary | ICD-10-CM

## 2023-07-18 DIAGNOSIS — I5043 Acute on chronic combined systolic (congestive) and diastolic (congestive) heart failure: Secondary | ICD-10-CM | POA: Diagnosis present

## 2023-07-18 DIAGNOSIS — I428 Other cardiomyopathies: Secondary | ICD-10-CM | POA: Diagnosis present

## 2023-07-18 DIAGNOSIS — F17200 Nicotine dependence, unspecified, uncomplicated: Secondary | ICD-10-CM

## 2023-07-18 DIAGNOSIS — D696 Thrombocytopenia, unspecified: Secondary | ICD-10-CM | POA: Diagnosis not present

## 2023-07-18 DIAGNOSIS — Z7984 Long term (current) use of oral hypoglycemic drugs: Secondary | ICD-10-CM | POA: Diagnosis not present

## 2023-07-18 DIAGNOSIS — I130 Hypertensive heart and chronic kidney disease with heart failure and stage 1 through stage 4 chronic kidney disease, or unspecified chronic kidney disease: Principal | ICD-10-CM | POA: Diagnosis present

## 2023-07-18 DIAGNOSIS — D509 Iron deficiency anemia, unspecified: Secondary | ICD-10-CM | POA: Diagnosis present

## 2023-07-18 DIAGNOSIS — I10 Essential (primary) hypertension: Secondary | ICD-10-CM | POA: Diagnosis not present

## 2023-07-18 DIAGNOSIS — E871 Hypo-osmolality and hyponatremia: Secondary | ICD-10-CM | POA: Diagnosis present

## 2023-07-18 DIAGNOSIS — N1831 Chronic kidney disease, stage 3a: Secondary | ICD-10-CM | POA: Diagnosis present

## 2023-07-18 DIAGNOSIS — I081 Rheumatic disorders of both mitral and tricuspid valves: Secondary | ICD-10-CM | POA: Diagnosis present

## 2023-07-18 DIAGNOSIS — Z23 Encounter for immunization: Secondary | ICD-10-CM

## 2023-07-18 DIAGNOSIS — I2489 Other forms of acute ischemic heart disease: Secondary | ICD-10-CM | POA: Diagnosis present

## 2023-07-18 DIAGNOSIS — Z79899 Other long term (current) drug therapy: Secondary | ICD-10-CM | POA: Diagnosis not present

## 2023-07-18 DIAGNOSIS — R7989 Other specified abnormal findings of blood chemistry: Secondary | ICD-10-CM | POA: Insufficient documentation

## 2023-07-18 DIAGNOSIS — Z87891 Personal history of nicotine dependence: Secondary | ICD-10-CM | POA: Diagnosis not present

## 2023-07-18 DIAGNOSIS — Z7901 Long term (current) use of anticoagulants: Secondary | ICD-10-CM

## 2023-07-18 DIAGNOSIS — N183 Chronic kidney disease, stage 3 unspecified: Secondary | ICD-10-CM | POA: Insufficient documentation

## 2023-07-18 DIAGNOSIS — N179 Acute kidney failure, unspecified: Secondary | ICD-10-CM | POA: Diagnosis present

## 2023-07-18 DIAGNOSIS — Z91119 Patient's noncompliance with dietary regimen due to unspecified reason: Secondary | ICD-10-CM

## 2023-07-18 DIAGNOSIS — I2510 Atherosclerotic heart disease of native coronary artery without angina pectoris: Secondary | ICD-10-CM | POA: Diagnosis present

## 2023-07-18 DIAGNOSIS — Z5986 Financial insecurity: Secondary | ICD-10-CM

## 2023-07-18 DIAGNOSIS — D638 Anemia in other chronic diseases classified elsewhere: Secondary | ICD-10-CM | POA: Insufficient documentation

## 2023-07-18 DIAGNOSIS — F17210 Nicotine dependence, cigarettes, uncomplicated: Secondary | ICD-10-CM | POA: Insufficient documentation

## 2023-07-18 DIAGNOSIS — G629 Polyneuropathy, unspecified: Secondary | ICD-10-CM | POA: Diagnosis present

## 2023-07-18 DIAGNOSIS — I513 Intracardiac thrombosis, not elsewhere classified: Secondary | ICD-10-CM | POA: Diagnosis present

## 2023-07-18 DIAGNOSIS — Z91148 Patient's other noncompliance with medication regimen for other reason: Secondary | ICD-10-CM | POA: Diagnosis not present

## 2023-07-18 DIAGNOSIS — I509 Heart failure, unspecified: Principal | ICD-10-CM

## 2023-07-18 DIAGNOSIS — E538 Deficiency of other specified B group vitamins: Secondary | ICD-10-CM | POA: Diagnosis present

## 2023-07-18 LAB — ECHOCARDIOGRAM COMPLETE
Area-P 1/2: 2.54 cm2
Calc EF: 16.2 %
Est EF: <20
Height: 71 in
MV M vel: 4.23 m/s
MV Peak grad: 71.6 mm[Hg]
Radius: 0.75 cm
S' Lateral: 5.4 cm
Single Plane A2C EF: 15.4 %
Single Plane A4C EF: 18 %
Weight: 4080 [oz_av]

## 2023-07-18 LAB — BASIC METABOLIC PANEL
Anion gap: 10 (ref 5–15)
BUN: 13 mg/dL (ref 6–20)
CO2: 22 mmol/L (ref 22–32)
Calcium: 8.9 mg/dL (ref 8.9–10.3)
Chloride: 107 mmol/L (ref 98–111)
Creatinine, Ser: 1.18 mg/dL (ref 0.61–1.24)
GFR, Estimated: >60 mL/min (ref >60)
Glucose, Bld: 91 mg/dL (ref 70–99)
Potassium: 4.2 mmol/L (ref 3.5–5.1)
Sodium: 139 mmol/L (ref 135–145)

## 2023-07-18 LAB — CBC
HCT: 49.3 % (ref 39.0–52.0)
HCT: 46.2 % (ref 39.0–52.0)
Hemoglobin: 14.8 g/dL (ref 13.0–17.0)
Hemoglobin: 14.1 g/dL (ref 13.0–17.0)
MCH: 27.4 pg (ref 26.0–34.0)
MCH: 27.3 pg (ref 26.0–34.0)
MCHC: 30 g/dL (ref 30.0–36.0)
MCHC: 30.5 g/dL (ref 30.0–36.0)
MCV: 91.3 fL (ref 80.0–100.0)
MCV: 89.5 fL (ref 80.0–100.0)
Platelets: 129 10*3/uL — ABNORMAL LOW (ref 150–400)
Platelets: 131 10*3/uL — ABNORMAL LOW (ref 150–400)
RBC: 5.4 MIL/uL (ref 4.22–5.81)
RBC: 5.16 MIL/uL (ref 4.22–5.81)
RDW: 17.8 % — ABNORMAL HIGH (ref 11.5–15.5)
RDW: 17.6 % — ABNORMAL HIGH (ref 11.5–15.5)
WBC: 3.8 10*3/uL — ABNORMAL LOW (ref 4.0–10.5)
WBC: 4.1 10*3/uL (ref 4.0–10.5)
nRBC: 0 % (ref 0.0–0.2)
nRBC: 0 % (ref 0.0–0.2)

## 2023-07-18 LAB — RESPIRATORY PANEL BY PCR

## 2023-07-18 LAB — COMPREHENSIVE METABOLIC PANEL
ALT: 15 U/L (ref 0–44)
AST: 24 U/L (ref 15–41)
Albumin: 3.1 g/dL — ABNORMAL LOW (ref 3.5–5.0)
Alkaline Phosphatase: 64 U/L (ref 38–126)
Anion gap: 9 (ref 5–15)
BUN: 13 mg/dL (ref 6–20)
CO2: 22 mmol/L (ref 22–32)
Calcium: 8.7 mg/dL — ABNORMAL LOW (ref 8.9–10.3)
Chloride: 106 mmol/L (ref 98–111)
Creatinine, Ser: 1.36 mg/dL — ABNORMAL HIGH (ref 0.61–1.24)
GFR, Estimated: >60 mL/min (ref >60)
Glucose, Bld: 87 mg/dL (ref 70–99)
Potassium: 4 mmol/L (ref 3.5–5.1)
Sodium: 137 mmol/L (ref 135–145)
Total Bilirubin: 2 mg/dL — ABNORMAL HIGH (ref 0.0–1.2)
Total Protein: 7.3 g/dL (ref 6.5–8.1)

## 2023-07-18 LAB — TROPONIN I (HIGH SENSITIVITY)
Troponin I (High Sensitivity): 55 ng/L — ABNORMAL HIGH (ref <18)
Troponin I (High Sensitivity): 83 ng/L — ABNORMAL HIGH (ref <18)

## 2023-07-18 LAB — RESP PANEL BY RT-PCR (RSV, FLU A&B, COVID)  RVPGX2
Influenza A by PCR: NEGATIVE
Influenza B by PCR: NEGATIVE
Resp Syncytial Virus by PCR: NEGATIVE
SARS Coronavirus 2 by RT PCR: NEGATIVE

## 2023-07-18 LAB — BRAIN NATRIURETIC PEPTIDE: B Natriuretic Peptide: 2243.2 pg/mL — ABNORMAL HIGH (ref 0.0–100.0)

## 2023-07-18 LAB — HIV ANTIBODY (ROUTINE TESTING W REFLEX): HIV Screen 4th Generation wRfx: NONREACTIVE

## 2023-07-18 MED ORDER — LOSARTAN POTASSIUM 25 MG PO TABS
50.0000 mg | ORAL_TABLET | Freq: Every day | ORAL | Status: DC
  Administered 2023-07-18: 50 mg via ORAL
  Filled 2023-07-18: qty 2

## 2023-07-18 MED ORDER — INFLUENZA VIRUS VACC SPLIT PF (FLUZONE) 0.5 ML IM SUSY
0.5000 mL | PREFILLED_SYRINGE | INTRAMUSCULAR | Status: AC
  Administered 2023-07-19: 0.5 mL via INTRAMUSCULAR
  Filled 2023-07-18: qty 0.5

## 2023-07-18 MED ORDER — FERROUS SULFATE 325 (65 FE) MG PO TABS
325.0000 mg | ORAL_TABLET | Freq: Every day | ORAL | Status: DC
  Administered 2023-07-18 – 2023-07-23 (×6): 325 mg via ORAL
  Filled 2023-07-18 (×6): qty 1

## 2023-07-18 MED ORDER — EMPAGLIFLOZIN 10 MG PO TABS
10.0000 mg | ORAL_TABLET | Freq: Every day | ORAL | Status: DC
  Administered 2023-07-18 – 2023-07-23 (×6): 10 mg via ORAL
  Filled 2023-07-18 (×6): qty 1

## 2023-07-18 MED ORDER — SODIUM CHLORIDE 0.9% FLUSH
3.0000 mL | Freq: Two times a day (BID) | INTRAVENOUS | Status: DC
  Administered 2023-07-18 – 2023-07-23 (×12): 3 mL via INTRAVENOUS

## 2023-07-18 MED ORDER — FUROSEMIDE 10 MG/ML IJ SOLN
40.0000 mg | Freq: Two times a day (BID) | INTRAMUSCULAR | Status: DC
  Administered 2023-07-18 – 2023-07-19 (×2): 40 mg via INTRAVENOUS
  Filled 2023-07-18: qty 4

## 2023-07-18 MED ORDER — SODIUM CHLORIDE 0.9% FLUSH: 3.0000 mL | INTRAVENOUS | Status: DC | PRN

## 2023-07-18 MED ORDER — LOSARTAN POTASSIUM 25 MG PO TABS: 50.0000 mg | ORAL_TABLET | Freq: Every day | ORAL | Status: DC

## 2023-07-18 MED ORDER — ONDANSETRON HCL 4 MG PO TABS: 4.0000 mg | ORAL_TABLET | Freq: Four times a day (QID) | ORAL | Status: DC | PRN

## 2023-07-18 MED ORDER — PNEUMOCOCCAL 20-VAL CONJ VACC 0.5 ML IM SUSY
0.5000 mL | PREFILLED_SYRINGE | INTRAMUSCULAR | Status: AC
  Administered 2023-07-19: 0.5 mL via INTRAMUSCULAR
  Filled 2023-07-18: qty 0.5

## 2023-07-18 MED ORDER — ACETAMINOPHEN 325 MG PO TABS
650.0000 mg | ORAL_TABLET | Freq: Four times a day (QID) | ORAL | Status: DC | PRN
  Administered 2023-07-18 – 2023-07-23 (×8): 650 mg via ORAL
  Filled 2023-07-18 (×8): qty 2

## 2023-07-18 MED ORDER — FUROSEMIDE 10 MG/ML IJ SOLN
40.0000 mg | Freq: Once | INTRAMUSCULAR | Status: AC
  Administered 2023-07-18: 40 mg via INTRAVENOUS
  Filled 2023-07-18: qty 4

## 2023-07-18 MED ORDER — SODIUM CHLORIDE 0.9% FLUSH
3.0000 mL | Freq: Two times a day (BID) | INTRAVENOUS | Status: DC
  Administered 2023-07-18 (×3): 3 mL via INTRAVENOUS

## 2023-07-18 MED ORDER — SPIRONOLACTONE 12.5 MG HALF TABLET
12.5000 mg | ORAL_TABLET | Freq: Every day | ORAL | Status: DC
Start: 2023-07-18 — End: 2023-07-23
  Administered 2023-07-18 – 2023-07-23 (×6): 12.5 mg via ORAL
  Filled 2023-07-18 (×6): qty 1

## 2023-07-18 MED ORDER — CARVEDILOL 12.5 MG PO TABS: 12.5000 mg | ORAL_TABLET | Freq: Two times a day (BID) | ORAL | Status: DC

## 2023-07-18 MED ORDER — ACETAMINOPHEN 650 MG RE SUPP: 650.0000 mg | Freq: Four times a day (QID) | RECTAL | Status: DC | PRN

## 2023-07-18 MED ORDER — HYDRALAZINE HCL 20 MG/ML IJ SOLN: 10.0000 mg | Freq: Four times a day (QID) | INTRAMUSCULAR | Status: DC | PRN

## 2023-07-18 MED ORDER — SODIUM CHLORIDE 0.9 % IV SOLN: 250.0000 mL | INTRAVENOUS | Status: AC | PRN

## 2023-07-18 MED ORDER — ONDANSETRON HCL 4 MG/2ML IJ SOLN: 4.0000 mg | Freq: Four times a day (QID) | INTRAMUSCULAR | Status: DC | PRN

## 2023-07-18 MED ORDER — TRAMADOL HCL 50 MG PO TABS: 25.0000 mg | ORAL_TABLET | Freq: Once | ORAL | Status: DC

## 2023-07-18 MED ORDER — ENOXAPARIN SODIUM 40 MG/0.4ML IJ SOSY
40.0000 mg | PREFILLED_SYRINGE | Freq: Every day | INTRAMUSCULAR | Status: DC
Start: 2023-07-18 — End: 2023-07-20
  Administered 2023-07-18 – 2023-07-20 (×3): 40 mg via SUBCUTANEOUS
  Filled 2023-07-18 (×3): qty 0.4

## 2023-07-18 MED ORDER — SACUBITRIL-VALSARTAN 49-51 MG PO TABS
1.0000 | ORAL_TABLET | Freq: Two times a day (BID) | ORAL | Status: DC
  Administered 2023-07-18 – 2023-07-23 (×10): 1 via ORAL
  Filled 2023-07-18 (×11): qty 1

## 2023-07-18 MED ORDER — ALBUTEROL SULFATE HFA 108 (90 BASE) MCG/ACT IN AERS: 2.0000 | INHALATION_SPRAY | RESPIRATORY_TRACT | Status: DC | PRN

## 2023-07-18 MED ORDER — CARVEDILOL 12.5 MG PO TABS
12.5000 mg | ORAL_TABLET | Freq: Two times a day (BID) | ORAL | Status: DC
  Administered 2023-07-18: 12.5 mg via ORAL
  Filled 2023-07-18: qty 1

## 2023-07-18 MED ORDER — ASPIRIN 81 MG PO TBEC
81.0000 mg | DELAYED_RELEASE_TABLET | Freq: Every day | ORAL | Status: DC
  Administered 2023-07-18 – 2023-07-20 (×3): 81 mg via ORAL
  Filled 2023-07-18 (×3): qty 1

## 2023-07-18 MED ORDER — ATORVASTATIN CALCIUM 40 MG PO TABS
40.0000 mg | ORAL_TABLET | Freq: Every day | ORAL | Status: DC
Start: 2023-07-18 — End: 2023-07-23
  Administered 2023-07-18 – 2023-07-23 (×6): 40 mg via ORAL
  Filled 2023-07-18 (×6): qty 1

## 2023-07-18 MED ORDER — FUROSEMIDE 10 MG/ML IJ SOLN
20.0000 mg | Freq: Two times a day (BID) | INTRAMUSCULAR | Status: DC
  Administered 2023-07-18: 20 mg via INTRAVENOUS
  Filled 2023-07-18: qty 4

## 2023-07-18 NOTE — Plan of Care (Signed)
  Problem: Education: Goal: Ability to verbalize understanding of medication therapies will improve Outcome: Progressing   Problem: Pain Managment: Goal: General experience of comfort will improve and/or be controlled Outcome: Progressing   Problem: Safety: Goal: Ability to remain free from injury will improve Outcome: Progressing

## 2023-07-18 NOTE — ED Provider Notes (Signed)
[place] EMERGENCY DEPARTMENT AT [facility] Provider Note   CSN: [id number] Arrival date & time: 07/18/23  0008     History  Chief Complaint  Patient presents with   Shortness of Breath    Hector Manning is a 58 y.o. male.  The history is provided by the patient.  Shortness of Breath Severity:  Moderate Onset quality:  Gradual Duration:  2 weeks Timing:  Constant Progression:  Worsening Chronicity:  Recurrent Context: not URI   Relieved by:  Nothing Worsened by:  Nothing Associated symptoms: wheezing   Associated symptoms: no fever and no hemoptysis   Risk factors: no prolonged immobilization and no recent surgery      History reviewed. No pertinent past medical history.   Home Medications Prior to Admission medications   Medication Sig Start Date End Date Taking? Authorizing Provider  acetaminophen (TYLENOL) 500 MG tablet Take 1,000 mg by mouth every 4 (four) hours as needed for mild pain. Patient not taking: Reported on 05/31/2023    [provider]  carvedilol (COREG) 12.5 MG tablet Take 1 tablet (12.5 mg total) by mouth 2 (two) times daily with a meal. Patient not taking: Reported on 05/31/2023 04/29/22   Rocky Morel, DO  DM-Phenylephrine-Acetaminophen (ALKA-SELTZER PLS SINUS & COUGH) 10-5-325 MG CAPS Take 2 capsules by mouth every 6 (six) hours as needed (cough / cold). Patient not taking: Reported on 05/31/2023    [provider]  empagliflozin (JARDIANCE) 10 MG TABS tablet Take 1 tablet (10 mg total) by mouth daily before breakfast. Patient not taking: Reported on 05/31/2023 04/29/22   Rocky Morel, DO  ferrous sulfate 325 (65 FE) MG tablet Take 1 tablet (325 mg total) by mouth daily with breakfast. Patient not taking: Reported on 05/31/2023 04/30/22   Rocky Morel, DO  furosemide (LASIX) 40 MG tablet Take 1 tablet (40 mg total) by mouth 2 (two) times daily. 05/31/23   Horton, Mayer Masker, MD  losartan (COZAAR) 50 MG tablet  Take 1 tablet (50 mg total) by mouth daily. Patient not taking: Reported on 05/31/2023 04/30/22   Rocky Morel, DO  naphazoline-pheniramine (VISINE) 0.025-0.3 % ophthalmic solution Place 2 drops into both eyes 2 (two) times daily as needed for eye irritation. Patient not taking: Reported on 05/31/2023    [provider]  spironolactone (ALDACTONE) 25 MG tablet Take 0.5 tablets (12.5 mg total) by mouth daily. Patient not taking: Reported on 05/31/2023 04/30/22   Rocky Morel, DO      Allergies    Patient has no known allergies.    Review of Systems   Review of Systems  Constitutional:  Negative for fever.  HENT:  Negative for facial swelling.   Eyes:  Negative for redness.  Respiratory:  Positive for shortness of breath and wheezing. Negative for hemoptysis.   Cardiovascular:  Positive for leg swelling.  All other systems reviewed and are negative.   Physical Exam Updated Vital Signs BP (!) 143/122 (BP Location: Left Arm)   Pulse (!) 109   Temp 97.9 F (36.6 C) (Oral)   Resp (!) 22   SpO2 100%  Physical Exam Vitals and nursing note reviewed.  Constitutional:      General: He is not in acute distress.    Appearance: He is well-developed. He is not diaphoretic.  HENT:     Head: Normocephalic and atraumatic.     Nose: Nose normal.  Eyes:     Conjunctiva/sclera: Conjunctivae normal.     Pupils: Pupils are  equal, round, and reactive to light.  Cardiovascular:     Rate and Rhythm: Normal rate and regular rhythm.  Pulmonary:     Effort: Pulmonary effort is normal.     Breath sounds: Wheezing and rales present.  Abdominal:     General: Bowel sounds are normal.     Palpations: Abdomen is soft.     Tenderness: There is no abdominal tenderness. There is no guarding or rebound.  Musculoskeletal:        General: Normal range of motion.     Cervical back: Normal range of motion and neck supple.     Right lower leg: Edema present.     Left lower leg: Edema present.   Skin:    General: Skin is warm and dry.     Capillary Refill: Capillary refill takes less than 2 seconds.  Neurological:     General: No focal deficit present.     Mental Status: He is alert and oriented to person, place, and time. Mental status is at baseline.     Deep Tendon Reflexes: Reflexes normal.     ED Results / Procedures / Treatments   Labs (all labs ordered are listed, but only abnormal results are displayed) Results for orders placed or performed during the hospital encounter of 07/18/23  Resp panel by RT-PCR (RSV, Flu A&B, Covid) Anterior Nasal Swab   Collection Time: 07/18/23 12:50 AM   Specimen: Anterior Nasal Swab  Result Value Ref Range   SARS Coronavirus 2 by RT PCR NEGATIVE NEGATIVE   Influenza A by PCR NEGATIVE NEGATIVE   Influenza B by PCR NEGATIVE NEGATIVE   Resp Syncytial Virus by PCR NEGATIVE NEGATIVE  Basic metabolic panel   Collection Time: 07/18/23  1:00 AM  Result Value Ref Range   Sodium 139 135 - 145 mmol/L   Potassium 4.2 3.5 - 5.1 mmol/L   Chloride 107 98 - 111 mmol/L   CO2 22 22 - 32 mmol/L   Glucose, Bld 91 70 - 99 mg/dL   BUN 13 6 - 20 mg/dL   Creatinine, Ser 1.61 0.61 - 1.24 mg/dL   Calcium 8.9 8.9 - 09.6 mg/dL   GFR, Estimated >04 >54 mL/min   Anion gap 10 5 - 15  CBC   Collection Time: 07/18/23  1:00 AM  Result Value Ref Range   WBC 3.8 (L) 4.0 - 10.5 K/uL   RBC 5.40 4.22 - 5.81 MIL/uL   Hemoglobin 14.8 13.0 - 17.0 g/dL   HCT 09.8 11.9 - 14.7 %   MCV 91.3 80.0 - 100.0 fL   MCH 27.4 26.0 - 34.0 pg   MCHC 30.0 30.0 - 36.0 g/dL   RDW 82.9 (H) 56.2 - 13.0 %   Platelets 129 (L) 150 - 400 K/uL   nRBC 0.0 0.0 - 0.2 %  Brain natriuretic peptide   Collection Time: 07/18/23  1:00 AM  Result Value Ref Range   B Natriuretic Peptide 2,243.2 (H) 0.0 - 100.0 pg/mL  Troponin I (High Sensitivity)   Collection Time: 07/18/23  1:00 AM  Result Value Ref Range   Troponin I (High Sensitivity) 55 (H) <18 ng/L   DG Chest Port 1 View Result  Date: 07/18/2023 CLINICAL DATA:  COPD, shortness of breath EXAM: PORTABLE CHEST 1 VIEW COMPARISON:  05/30/2023 FINDINGS: Cardiomegaly, vascular congestion. Layering bilateral effusions with bilateral lower lobe airspace opacities. No overt edema. No acute bony abnormality. IMPRESSION: Cardiomegaly, vascular congestion. Layering bilateral effusions with bibasilar atelectasis or infiltrates. Electronically Signed  By: Charlett Nose M.D.   On: 07/18/2023 01:24    EKG EKG Interpretation Date/Time:  Saturday July 18 2023 00:42:08 EST Ventricular Rate:  117 PR Interval:  172 QRS Duration:  92 QT Interval:  340 QTC Calculation: 475 R Axis:   -59  Text Interpretation: Sinus tachycardia Anterior infarct, old Confirmed by Nicanor Alcon, Telitha Plath ([id number]) on 07/18/2023 2:14:55 AM  Radiology DG Chest Port 1 View Result Date: 07/18/2023 CLINICAL DATA:  COPD, shortness of breath EXAM: PORTABLE CHEST 1 VIEW COMPARISON:  05/30/2023 FINDINGS: Cardiomegaly, vascular congestion. Layering bilateral effusions with bilateral lower lobe airspace opacities. No overt edema. No acute bony abnormality. IMPRESSION: Cardiomegaly, vascular congestion. Layering bilateral effusions with bibasilar atelectasis or infiltrates. Electronically Signed   By: Charlett Nose M.D.   On: 07/18/2023 01:24    Procedures Procedures    Medications Ordered in ED Medications  furosemide (LASIX) injection 40 mg (40 mg Intravenous Given 07/18/23 0253)    ED Course/ Medical Decision Making/ A&P                                 Medical Decision Making Patient with leg swelling and SOB   Amount and/or Complexity of Data Reviewed Independent Historian: EMS    Details: See above External Data Reviewed: notes.    Details: Previous notes and labs  Labs: ordered.    Details: Elevated BNP 2243, 2, elevated troponin 55. Negative covid and flu, normal sodium 139, normal potassium 4.2 normal creatinine 1.18  Radiology: ordered and independent  interpretation performed.    Details: CHF by me on CXR ECG/medicine tests: ordered and independent interpretation performed. Decision-making details documented in ED Course.  Risk Prescription drug management. Decision regarding hospitalization.    Final Clinical Impression(s) / ED Diagnoses Final diagnoses:  Acute congestive heart failure, unspecified heart failure type [facility])   The patient appears reasonably stabilized for admission considering the current resources, flow, and capabilities available in the ED at this time, and I doubt any other [facility] requiring further screening and/or treatment in the ED prior to admission.  Rx / DC Orders ED Discharge Orders     None         Lawayne Hartig, MD 07/18/23 6045

## 2023-07-18 NOTE — ED Triage Notes (Signed)
Pt. BIB [facility] from home for SOB and leg swelling and pain x2 weeks. Pt went to [facility] and was discharged 2 weeks ago with a "fluid pill" per EMS. Pt. Did not pick up his medication and has been progressively getting worse. BP: 160/110 with EMS all other VS stable.

## 2023-07-18 NOTE — H&P (Signed)
History and Physical    ERIN KELLIE OZH:[id number] DOB: 1965/12/07 DOA: 07/18/2023  PCP: Patient, No Pcp Per   Patient coming from: Home   Chief Complaint:  Chief Complaint  Patient presents with   Shortness of Breath   ED TRIAGE note:  Pt. BIB [facility] from home for SOB and leg swelling and pain x2 weeks. Pt went to [facility] and was discharged 2 weeks ago with a "fluid pill" per EMS. Pt. Did not pick up his medication and has been progressively getting worse. BP: 160/110 with EMS all other VS stable.      HPI:  Hector Manning is a 58 y.o. male with medical history significant of congestive heart failure with reduced EF 25 to 30%, essential hypertension, CKD stage IIIa, chronic anemia, chronic smoking, and chronic smoker presented to emergency department complaining of dyspnea and bilateral lower extremity swelling for 2 weeks.  Patient reported that he was discharged from the hospital 2 weeks ago, supposed to pick up his diuretic/fluid pill however he has not picked up his medication yet.  Even though patient is saying that he has been discharged from the hospital 2 weeks ago I am not able to find any record on the chart. During my evaluation at the bedside patient reported that he has progressively worsening dyspnea and bilateral lower extremity swelling for last 2 to 3 weeks.  He has been not taking any any of his blood pressure regimen for last 3 months and either taking any other medication at all.  Patient reported mucoid productive cough, orthopnea, dyspnea and PND.  Also complained about bilateral lower extremities pain.  Denies any fever and chill.  Denies any abdominal pain, constipation, diarrhea, nausea and vomiting.  No other complaint at this time.   ED Course:  At presentation to emergency department patient is borderline hypertensive blood pressure 150/119 and tachycardic 109.  O2 sat 100% on 4 L oxygen. Initial troponin 55.  Elevated BNP 2243. CBC showing low WBC count 3.8, low  platelet count 129. BMP unremarkable. Respiratory panel negative for COVID, flu and RSV.  Chest x-ray showing cardiomegaly and vascular congestion.  Layering bilateral effusion with bilateral atelectasis or infiltrate.  In the ED patient has been treated with IV Lasix 40 mg.  ED physician Dr. Nicanor Alcon reported that patient is not taking Lasix and Coreg however patient has some access issues to his medications from the pharmacy.  Needs social worker to involve to establish care with community health care center.  Hospitalist has been contacted for further evaluation management of CHF exacerbation.    Significant labs in the ED: Lab Orders         Resp panel by RT-PCR (RSV, Flu A&B, Covid) Anterior Nasal Swab         Basic metabolic panel         CBC         Brain natriuretic peptide         Comprehensive metabolic panel         CBC       Review of Systems:  ROS  History reviewed. No pertinent past medical history.  Past Surgical History:  Procedure Laterality Date   ABDOMINAL SURGERY     INCISION AND DRAINAGE WOUND WITH NERVE AND ARTERY REPAIR Left 06/24/2021   Procedure: LEFT WRIST VOLVAR LACERATION WOUND EXPLORATION. INCISION AND DRAINAGE WOUND WITH NERVE TIMES ONE, AND TENDON TIMES SIX, ARTERY TIMES ONE  REPAIR;  Surgeon: Gomez Cleverly, MD;  Location:  [facility] OR;  Service: Orthopedics;  Laterality: Left;   RIGHT/LEFT HEART CATH AND CORONARY ANGIOGRAPHY N/A 04/29/2022   Procedure: RIGHT/LEFT HEART CATH AND CORONARY ANGIOGRAPHY;  Surgeon: Tonny Bollman, MD;  Location: [facility] INVASIVE CV LAB;  Service: Cardiovascular;  Laterality: N/A;     reports that he quit smoking about 15 months ago. His smoking use included cigarettes. He has never used smokeless tobacco. He reports current alcohol use. He reports that he does not use drugs.  No Known Allergies  History reviewed. No pertinent family history.  Prior to Admission medications   Medication Sig Start Date End Date Taking?  Authorizing Provider  acetaminophen (TYLENOL) 500 MG tablet Take 1,000 mg by mouth every 4 (four) hours as needed for mild pain. Patient not taking: Reported on 05/31/2023    [provider]  carvedilol (COREG) 12.5 MG tablet Take 1 tablet (12.5 mg total) by mouth 2 (two) times daily with a meal. Patient not taking: Reported on 05/31/2023 04/29/22   Rocky Morel, DO  DM-Phenylephrine-Acetaminophen (ALKA-SELTZER PLS SINUS & COUGH) 10-5-325 MG CAPS Take 2 capsules by mouth every 6 (six) hours as needed (cough / cold). Patient not taking: Reported on 05/31/2023    [provider]  empagliflozin (JARDIANCE) 10 MG TABS tablet Take 1 tablet (10 mg total) by mouth daily before breakfast. Patient not taking: Reported on 05/31/2023 04/29/22   Rocky Morel, DO  ferrous sulfate 325 (65 FE) MG tablet Take 1 tablet (325 mg total) by mouth daily with breakfast. Patient not taking: Reported on 05/31/2023 04/30/22   Rocky Morel, DO  furosemide (LASIX) 40 MG tablet Take 1 tablet (40 mg total) by mouth 2 (two) times daily. 05/31/23   Horton, Mayer Masker, MD  losartan (COZAAR) 50 MG tablet Take 1 tablet (50 mg total) by mouth daily. Patient not taking: Reported on 05/31/2023 04/30/22   Rocky Morel, DO  naphazoline-pheniramine (VISINE) 0.025-0.3 % ophthalmic solution Place 2 drops into both eyes 2 (two) times daily as needed for eye irritation. Patient not taking: Reported on 05/31/2023    [provider]  spironolactone (ALDACTONE) 25 MG tablet Take 0.5 tablets (12.5 mg total) by mouth daily. Patient not taking: Reported on 05/31/2023 04/30/22   Rocky Morel, DO     Physical Exam: Vitals:   07/18/23 0029 07/18/23 0220 07/18/23 0330  BP: (!) 150/119 (!) 143/122 (!) 161/109  Pulse: (!) 112 (!) 109 (!) 110  Resp: 18 (!) 22 17  Temp: (!) 97.5 F (36.4 C) 97.9 F (36.6 C)   TempSrc: Oral Oral   SpO2: 100% 100% 100%    Physical Exam   Labs on Admission: I have personally  reviewed following labs and imaging studies  CBC: Recent Labs  Lab 07/18/23 0100  WBC 3.8*  HGB 14.8  HCT 49.3  MCV 91.3  PLT 129*   Basic Metabolic Panel: Recent Labs  Lab 07/18/23 0100  NA 139  K 4.2  CL 107  CO2 22  GLUCOSE 91  BUN 13  CREATININE 1.18  CALCIUM 8.9   GFR: CrCl cannot be calculated (Unknown ideal weight.). Liver Function Tests: No results for input(s): "AST", "ALT", "ALKPHOS", "BILITOT", "PROT", "ALBUMIN" in the last 168 hours. No results for input(s): "LIPASE", "AMYLASE" in the last 168 hours. No results for input(s): "AMMONIA" in the last 168 hours. Coagulation Profile: No results for input(s): "INR", "PROTIME" in the last 168 hours. Cardiac Enzymes: Recent Labs  Lab 07/18/23 0100  TROPONINIHS 55*   BNP (last 3 results)  Recent Labs    05/30/23 2259 07/18/23 0100  BNP 2,811.8* 2,243.2*   HbA1C: No results for input(s): "HGBA1C" in the last 72 hours. CBG: No results for input(s): "GLUCAP" in the last 168 hours. Lipid Profile: No results for input(s): "CHOL", "HDL", "LDLCALC", "TRIG", "CHOLHDL", "LDLDIRECT" in the last 72 hours. Thyroid Function Tests: No results for input(s): "TSH", "T4TOTAL", "FREET4", "T3FREE", "THYROIDAB" in the last 72 hours. Anemia Panel: No results for input(s): "VITAMINB12", "FOLATE", "FERRITIN", "TIBC", "IRON", "RETICCTPCT" in the last 72 hours. Urine analysis: No results found for: "COLORURINE", "APPEARANCEUR", "LABSPEC", "PHURINE", "GLUCOSEU", "HGBUR", "BILIRUBINUR", "KETONESUR", "PROTEINUR", "UROBILINOGEN", "NITRITE", "LEUKOCYTESUR"  Radiological Exams on Admission: I have personally reviewed images DG Chest Port 1 View Result Date: 07/18/2023 CLINICAL DATA:  COPD, shortness of breath EXAM: PORTABLE CHEST 1 VIEW COMPARISON:  05/30/2023 FINDINGS: Cardiomegaly, vascular congestion. Layering bilateral effusions with bilateral lower lobe airspace opacities. No overt edema. No acute bony abnormality. IMPRESSION:  Cardiomegaly, vascular congestion. Layering bilateral effusions with bibasilar atelectasis or infiltrates. Electronically Signed   By: Charlett Nose M.D.   On: 07/18/2023 01:24     EKG: My personal interpretation of EKG shows: Sinus tachycardia heart rate 117.   Assessment/Plan: Principal Problem:   Acute combined systolic and diastolic congestive heart failure (HCC) Active Problems:   Essential hypertension   CKD (chronic kidney disease) stage 3, GFR 30-59 ml/min (HCC)   Continuous dependence on cigarette smoking   Anemia of chronic disease   Thrombocytopenia (HCC)   Acute exacerbation of CHF (congestive heart failure) (HCC)   Elevated troponin    Assessment and Plan: Acute exacerbation of combined systolic and diastolic heart failure History of combined heart failure reduced EF 25 to 30% Essential hypertension -Patient is noncompliant with medication and diet.  Not taking medications for last 3 months.  Patient does not have any primary care team neither follows with cardiology outpatient. -Physical exam showing bilateral lower extremity nonpitting edema 1+. - Patient is tachycardic and borderline hypotensive.  O2 sat 100% on 4 L oxygen. - Elevated BNP 2243. -Echo from 04/18/2022 showed EF 25 to 30% and grade 2 diastolic heart failure with left ventricular global hypokinesis. -Chest x-ray showing cardiomegaly and vascular congestion.  Layering bilateral effusion with bibasilar atelectasis or infiltrate. -Initial troponin 55 and pending repeat troponin.  EKG showing sinus tachycardia heart rate 117.  Patient denies any chest pain. -For the concern for CHF exacerbation in the ED patient has been given Lasix 40 mg IV. -Plan to continue IV Lasix 20 mg twice daily. - Continue GDMT guided medication include Coreg 12.5 mg twice daily, Jardiance 10 mg daily, losartan 50 mg daily and spironolactone 12.5 mg daily. -Obtain echocardiogram. - Continue to monitor strict I's/O, daily weight,  monitor urine output.  Assess volume status on daily basis and can adjust dose of diuretics accordingly. -Given patient has advanced heart failure and previous echocardiogram showing global hypokinesis patient will benefit from LifeVest versus ICD placement in the long-term.  If echocardiogram shows again no wall motion abnormality patient might undergo heart heart cath as well.  Will defer these plan to heart failure team. - Consulted heart failure team and follow-up with recommendation. - Consulted transition care team to help with medication assistance and establish care with community health care clinic.  Elevated troponin secondary to demand -Elevated troponin 55.  Pending second troponin.  EKG showing sinus tachycardia heart rate in the 70.  Patient denies any chest pain.  There is no concern for acute coronary syndrome at  this moment.  Elevated troponin secondary to demand ischemia in the context of CHF exacerbation.   History of nonobstructive CAD - Previous heart cath from 04/18/2022 showed nonobstructive CAD. - Continue medical management with Coreg. - Starting Lipitor and aspirin.  CKD stage IIIa -Creatinine 1.18 and GFR above 60.  Baseline GFR around 50-57 and creatinine within baseline now. - Monitor monitor renal function.  Thrombocytopenia -Low platelet of 139.  Platelet count was 136, one  month ago.  Thrombocytopenia likely secondary to in the setting of chronic alcohol use.  Continue to monitor.  Chronic smoking of cigarettes -Patient currently smoking half pack cigarettes in a day.  Holding nicotine in the setting of elevated troponin to prevent coronary vasospasm. - Counseled patient at the bedside for smoking cessation.  Anemia of chronic disease -Stable H&H 48.4 and 49.  Continue to monitor.  DVT prophylaxis:  Lovenox Code Status:  Full Code Diet: Heart healthy diet diet fluid restriction 2 L/day salt restriction 2 g/day Family Communication: None  present Disposition Plan: Pending improvement of shortness of breath.  Continue to monitor urine output and volume status and adjust Lasix accordingly.  Will follow-up with heart failure team recommendation. Consults: Heart failure team Admission status:   Inpatient, Telemetry bed  Severity of Illness: The appropriate patient status for this patient is INPATIENT. Inpatient status is judged to be reasonable and necessary in order to provide the required intensity of service to ensure the patient's safety. The patient's presenting symptoms, physical exam findings, and initial radiographic and laboratory data in the context of their chronic comorbidities is felt to place them at high risk for further clinical deterioration. Furthermore, it is not anticipated that the patient will be medically stable for discharge from the hospital within 2 midnights of admission.   * I certify that at the point of admission it is my clinical judgment that the patient will require inpatient hospital care spanning beyond 2 midnights from the point of admission due to high intensity of service, high risk for further deterioration and high frequency of surveillance required.Marland Kitchen    Tereasa Coop, MD [facility]  How to contact the [facility] Attending or Consulting provider 7A - 7P or covering provider during after hours 7P -7A, for this patient.  Check the care team in [facility] and look for a) attending/consulting [facility] provider listed and b) the [facility] team listed Log into [email or web address] and use [place]'s universal password to access. If you do not have the password, please contact the hospital operator. Locate the [facility] provider you are looking for under [facility] and page to a number that you can be directly reached. If you still have difficulty reaching the provider, please page the [facility] (Director on Call) for the Hospitalists listed on amion for assistance.  07/18/2023, 4:49 AM

## 2023-07-18 NOTE — Consult Note (Signed)
Cardiology Consultation   Patient ID: Hector Manning MRN: [id number]; DOB: [birth year]  Admit date: 07/18/2023 Date of Consult: 07/18/2023  PCP:  Patient, No Pcp Per   [facility] Providers Cardiologist:  None        Patient Profile:   Hector Manning is a 58 y.o. male with a hx of severe nonischemic cardiomyopathy who is being seen 07/18/2023 for the evaluation of acute on chronic heart failure and left ventricular thrombus at the request of Dr. Butler Denmark.  History of Present Illness:   Hector Manning is a 58 year old man with chronic hypertension and smoking, who was diagnosed with severe nonischemic cardiomyopathy in October 2023 when he presented with acute heart failure.  He was treated with comprehensive guideline directed medical therapy and aggressive diuresis.    He has been off all medications for about 2 weeks and has gained a tremendous amount of fluid.  He presents with severe shortness of breath, coughing, lower extremity edema, abdominal distention, chest and abdominal and leg discomfort.  His blood pressure is severely elevated.  Cardiac catheterization 04/29/2022 showed normal coronary arteries and showed that at that time he was euvolemic/well compensated (PAWP 5 mmHg, mean PAP 17 mmHg, cardiac index 2.34 L/min/m body surface area).  On the day of the catheterization he weighed 102 kg.  He has a difficult social situation.  At the time of his previous discharge she was unsheltered, although he now does have a home.  He does not have any income.  He reports that he "lost" his prescription for furosemide and he has not been taking any medications for over a month.  His weight is now 115.7 kg.  After receiving intravenous furosemide he reports that he has filled the urinal 4 times this morning.  His breathing has improved somewhat.  He still has chest discomfort and lower extremity discomfort.  His echocardiogram shows severe biventricular dilation and dysfunction with LVEF  less than 20% and a new left ventricular apical thrombus.  There is at least mild-moderate mitral insufficiency with an eccentric jet.   Past medical history significant for nonischemic cardiomyopathy and severe heart failure with reduced left ventricular ejection fraction, hypertension, smoking  Past Surgical History:  Procedure Laterality Date   ABDOMINAL SURGERY     INCISION AND DRAINAGE WOUND WITH NERVE AND ARTERY REPAIR Left 06/24/2021   Procedure: LEFT WRIST VOLVAR LACERATION WOUND EXPLORATION. INCISION AND DRAINAGE WOUND WITH NERVE TIMES ONE, AND TENDON TIMES SIX, ARTERY TIMES ONE  REPAIR;  Surgeon: Gomez Cleverly, MD;  Location: [facility] OR;  Service: Orthopedics;  Laterality: Left;   RIGHT/LEFT HEART CATH AND CORONARY ANGIOGRAPHY N/A 04/29/2022   Procedure: RIGHT/LEFT HEART CATH AND CORONARY ANGIOGRAPHY;  Surgeon: Tonny Bollman, MD;  Location: [facility] INVASIVE CV LAB;  Service: Cardiovascular;  Laterality: N/A;       Inpatient Medications: Scheduled Meds:  aspirin EC  81 mg Oral Daily   atorvastatin  40 mg Oral Daily   carvedilol  12.5 mg Oral BID WC   empagliflozin  10 mg Oral QAC breakfast   enoxaparin (LOVENOX) injection  40 mg Subcutaneous Daily   ferrous sulfate  325 mg Oral Q breakfast   furosemide  20 mg Intravenous BID   losartan  50 mg Oral Daily   sodium chloride flush  3 mL Intravenous Q12H   sodium chloride flush  3 mL Intravenous Q12H   spironolactone  12.5 mg Oral Daily   Continuous Infusions:  sodium chloride     PRN  Meds: sodium chloride, acetaminophen **OR** acetaminophen, hydrALAZINE, ondansetron **OR** ondansetron (ZOFRAN) IV, sodium chloride flush  Allergies:   No Known Allergies  Social History:   Social History   Socioeconomic History   Marital status: Single    Spouse name: Not on file   Number of children: 4   Years of education: Not on file   Highest education level: High school graduate  Occupational History   Occupation: [employer]ployed     Comment: receiving NO checks at this moment  Tobacco Use   Smoking status: Former    Current packs/day: 0.00    Types: Cigarettes    Quit date: 04/13/2022    Years since quitting: 1.2   Smokeless tobacco: Never  Vaping Use   Vaping status: Never Used  Substance and Sexual Activity   Alcohol use: Yes    Comment: couple beers a day   Drug use: No   Sexual activity: Not on file  Other Topics Concern   Not on file  Social History Narrative   Not on file   Social Drivers of Health   Financial Resource Strain: High Risk (04/28/2022)   Overall Financial Resource Strain (CARDIA)    Difficulty of Paying Living Expenses: Very hard  Food Insecurity: No Food Insecurity (04/28/2022)   Hunger Vital Sign    Worried About Running Out of Food in the Last Year: Never true    Ran Out of Food in the Last Year: Never true  Transportation Needs: No Transportation Needs (04/28/2022)   PRAPARE - Administrator, Civil Service (Medical): No    Lack of Transportation (Non-Medical): No  Physical Activity: Not on file  Stress: Not on file  Social Connections: Not on file  Intimate Partner Violence: Not on file    Family History:   History reviewed. No pertinent family history.   ROS:  Please see the history of present illness.   All other ROS reviewed and negative.     Physical Exam/Data:   Vitals:   07/18/23 0830 07/18/23 0900 07/18/23 0930 07/18/23 1011  BP: 122/82 (!) 123/92 (!) 121/107 (!) 130/98  Pulse: 90 90 90 90  Resp: 18  18 15   Temp:    98.1 F (36.7 C)  TempSrc:    Oral  SpO2: 100% 100% 98% 98%  Weight:      Height:       No intake or output data in the 24 hours ending 07/18/23 1102    07/18/2023    6:43 AM 04/26/2022    5:00 AM 04/25/2022    5:00 AM  Last 3 Weights  Weight (lbs) 255 lb 224 lb 13.9 oz 225 lb 1.4 oz  Weight (kg) 115.667 kg 102 kg 102.1 kg     Body mass index is 35.57 kg/m.  General:  Well nourished, well developed, in no acute distress,  coughing frequently HEENT: normal Neck: 12 cm JVD Vascular: No carotid bruits; Distal pulses 2+ bilaterally Cardiac:  normal S1, S2; RRR; no murmur.  There is a summation gallop Lungs:  clear to auscultation bilaterally, no wheezing, rhonchi or rales  Abd: Distended, mildly tender, + hepatomegaly  Ext: Bilateral 2+ heart pitting edema Musculoskeletal:  No deformities, BUE and BLE strength normal and equal Skin: warm and dry  Neuro:  CNs 2-12 intact, no focal abnormalities noted Psych:  Normal affect   EKG:  The EKG was personally reviewed and demonstrates: Sinus tachycardia, poor R wave progression, no ischemic repolarization abnormalities. Telemetry:  Telemetry was  personally reviewed and demonstrates: Sinus rhythm  Relevant CV Studies: Echocardiogram 07/18/2023   1. Complex, layered LV apical thrombus with the largest dimension  measuring 2.7x1cm. Left ventricular ejection fraction, by estimation, is  <20%. The left ventricle has severely decreased function. The left  ventricle demonstrates global hypokinesis. The  left ventricular internal cavity size was severely dilated. Left  ventricular diastolic parameters are consistent with Grade III diastolic  dysfunction (restrictive).   2. Right ventricular systolic function is moderately reduced. The right  ventricular size is severely enlarged. There is normal pulmonary artery  systolic pressure. The estimated right ventricular systolic pressure is  34.4 mmHg.   3. Left atrial size was severely dilated.   4. Right atrial size was severely dilated.   5. Large pleural effusion in the left lateral region.   6. Mitral regurgitation related to posterior restriction, suspect degree  is underestimated; eccentric jet on A2c view. The mitral valve is  abnormal. Mild to moderate mitral valve regurgitation.   7. Tricuspid valve regurgitation is moderate.   8. The aortic valve has an indeterminant number of cusps. Aortic valve  regurgitation  is not visualized.   9. The inferior vena cava is dilated in size with <50% respiratory  variability, suggesting right atrial pressure of 15 mmHg.   Comparison(s): Prior images reviewed side by side. Biventricular disease  has worsened, valve regurgitation has worsened, and left apical appendage  thrombus is new.    Laboratory Data:  High Sensitivity Troponin:   Recent Labs  Lab 07/18/23 0100 07/18/23 0336  TROPONINIHS 55* 83*     Chemistry Recent Labs  Lab 07/18/23 0100 07/18/23 0449  NA 139 137  K 4.2 4.0  CL 107 106  CO2 22 22  GLUCOSE 91 87  BUN 13 13  CREATININE 1.18 1.36*  CALCIUM 8.9 8.7*  GFRNONAA >60 >60  ANIONGAP 10 9    Recent Labs  Lab 07/18/23 0449  PROT 7.3  ALBUMIN 3.1*  AST 24  ALT 15  ALKPHOS 64  BILITOT 2.0*   Lipids No results for input(s): "CHOL", "TRIG", "HDL", "LABVLDL", "LDLCALC", "CHOLHDL" in the last 168 hours.  Hematology Recent Labs  Lab 07/18/23 0100 07/18/23 0449  WBC 3.8* 4.1  RBC 5.40 5.16  HGB 14.8 14.1  HCT 49.3 46.2  MCV 91.3 89.5  MCH 27.4 27.3  MCHC 30.0 30.5  RDW 17.8* 17.6*  PLT 129* 131*   Thyroid No results for input(s): "TSH", "FREET4" in the last 168 hours.  BNP Recent Labs  Lab 07/18/23 0100  BNP 2,243.2*    DDimer No results for input(s): "DDIMER" in the last 168 hours.   Radiology/Studies:  ECHOCARDIOGRAM COMPLETE Result Date: 07/18/2023    ECHOCARDIOGRAM REPORT   Patient Name:   Hector Manning Date of Exam: 07/18/2023 Medical Rec #:  [id number]     Height:       71.0 in Accession #:    [id number]    Weight:       255.0 lb Date of Birth:  June 24, 1966      BSA:          2.338 m Patient Age:    57 years      BP:           122/82 mmHg Patient Gender: M             HR:           88 bpm. Exam Location:  Inpatient Procedure: 2D Echo, Color Doppler  and Cardiac Doppler Indications:    I50.21 Acute systolic (congestive) heart failure; I50.31 Acute                 diastolic (congestive) heart failure  History:         Patient has prior history of Echocardiogram examinations, most                 recent 04/25/2022. CHF; Risk Factors:Hypertension.  Sonographer:    Irving Burton Senior RDCS Referring Phys: [phone] SUBRINA SUNDIL IMPRESSIONS  1. Complex, layered LV apical thrombus with the largest dimension measuring 2.7x1cm. Left ventricular ejection fraction, by estimation, is <20%. The left ventricle has severely decreased function. The left ventricle demonstrates global hypokinesis. The left ventricular internal cavity size was severely dilated. Left ventricular diastolic parameters are consistent with Grade III diastolic dysfunction (restrictive).  2. Right ventricular systolic function is moderately reduced. The right ventricular size is severely enlarged. There is normal pulmonary artery systolic pressure. The estimated right ventricular systolic pressure is 34.4 mmHg.  3. Left atrial size was severely dilated.  4. Right atrial size was severely dilated.  5. Large pleural effusion in the left lateral region.  6. Mitral regurgitation related to posterior restriction, suspect degree is underestimated; eccentric jet on A2c view. The mitral valve is abnormal. Mild to moderate mitral valve regurgitation.  7. Tricuspid valve regurgitation is moderate.  8. The aortic valve has an indeterminant number of cusps. Aortic valve regurgitation is not visualized.  9. The inferior vena cava is dilated in size with <50% respiratory variability, suggesting right atrial pressure of 15 mmHg. Comparison(s): Prior images reviewed side by side. Biventricular disease has worsened, valve regurgitation has worsened, and left apical appendage thrombus is new. Conclusion(s)/Recommendation(s): Will reach out to primary team. FINDINGS  Left Ventricle: Complex, layered LV apical thrombus with the largest dimension measuring 2.7x1cm. Left ventricular ejection fraction, by estimation, is <20%. The left ventricle has severely decreased function. The left ventricle  demonstrates global hypokinesis. The left ventricular internal cavity size was severely dilated. There is no left ventricular hypertrophy. Left ventricular diastolic parameters are consistent with Grade III diastolic dysfunction (restrictive). Right Ventricle: The right ventricular size is severely enlarged. No increase in right ventricular wall thickness. Right ventricular systolic function is moderately reduced. There is normal pulmonary artery systolic pressure. The tricuspid regurgitant velocity is 2.20 m/s, and with an assumed right atrial pressure of 15 mmHg, the estimated right ventricular systolic pressure is 34.4 mmHg. Left Atrium: Left atrial size was severely dilated. Right Atrium: Right atrial size was severely dilated. Pericardium: Trivial pericardial effusion is present. The pericardial effusion is posterior to the left ventricle. Mitral Valve: Mitral regurgitation related to posterior restriction, suspect degree is underestimated; eccentric jet on A2c view. The mitral valve is abnormal. Mild to moderate mitral valve regurgitation. Tricuspid Valve: The tricuspid valve is normal in structure. Tricuspid valve regurgitation is moderate. Aortic Valve: The aortic valve has an indeterminant number of cusps. Aortic valve regurgitation is not visualized. Pulmonic Valve: The pulmonic valve was normal in structure. Pulmonic valve regurgitation is trivial. No evidence of pulmonic stenosis. Aorta: The aortic root and ascending aorta are structurally normal, with no evidence of dilitation. Venous: The inferior vena cava is dilated in size with less than 50% respiratory variability, suggesting right atrial pressure of 15 mmHg. IAS/Shunts: No atrial level shunt detected by color flow Doppler. Additional Comments: There is a large pleural effusion in the left lateral region.  LEFT VENTRICLE PLAX 2D LVIDd:  6.20 cm      Diastology LVIDs:         5.40 cm      LV e' medial:    2.75 cm/s LV PW:         0.80 cm       LV E/e' medial:  22.0 LV IVS:        0.70 cm      LV e' lateral:   6.00 cm/s LVOT diam:     2.10 cm      LV E/e' lateral: 10.1 LV SV:         38 LV SV Index:   16 LVOT Area:     3.46 cm  LV Volumes (MOD) LV vol d, MOD A2C: 280.0 ml LV vol d, MOD A4C: 194.0 ml LV vol s, MOD A2C: 237.0 ml LV vol s, MOD A4C: 159.0 ml LV SV MOD A2C:     43.0 ml LV SV MOD A4C:     194.0 ml LV SV MOD BP:      37.7 ml RIGHT VENTRICLE RV S prime:     7.01 cm/s TAPSE (M-mode): 1.9 cm LEFT ATRIUM              Index        RIGHT ATRIUM           Index LA diam:        4.70 cm  2.01 cm/m   RA Area:     42.30 cm LA Vol (A2C):   127.0 ml 54.32 ml/m  RA Volume:   195.00 ml 83.41 ml/m LA Vol (A4C):   91.0 ml  38.92 ml/m LA Biplane Vol: 108.0 ml 46.20 ml/m  AORTIC VALVE LVOT Vmax:   72.50 cm/s LVOT Vmean:  56.800 cm/s LVOT VTI:    0.110 m  AORTA Ao Root diam: 3.10 cm Ao Asc diam:  3.60 cm MITRAL VALVE                  TRICUSPID VALVE MV Area (PHT): 2.54 cm       TR Peak grad:   19.4 mmHg MV Decel Time: 299 msec       TR Vmax:        220.00 cm/s MR Peak grad:    71.6 mmHg MR Mean grad:    48.0 mmHg    SHUNTS MR Vmax:         423.00 cm/s  Systemic VTI:  0.11 m MR Vmean:        329.0 cm/s   Systemic Diam: 2.10 cm MR PISA:         3.53 cm MR PISA Eff ROA: 32 mm MR PISA Radius:  0.75 cm MV E velocity: 60.40 cm/s MV A velocity: 30.00 cm/s MV E/A ratio:  2.01 Riley Lam MD Electronically signed by Riley Lam MD Signature Date/Time: 07/18/2023/9:44:35 AM    Final    DG Chest Port 1 View Result Date: 07/18/2023 CLINICAL DATA:  COPD, shortness of breath EXAM: PORTABLE CHEST 1 VIEW COMPARISON:  05/30/2023 FINDINGS: Cardiomegaly, vascular congestion. Layering bilateral effusions with bilateral lower lobe airspace opacities. No overt edema. No acute bony abnormality. IMPRESSION: Cardiomegaly, vascular congestion. Layering bilateral effusions with bibasilar atelectasis or infiltrates. Electronically Signed   By: Charlett Nose M.D.    On: 07/18/2023 01:24     Assessment and Plan:   Acute on chronic HFrEF: Severe biventricular dysfunction, evidence of elevated filling pressures and reduced cardiac output.  Needs aggressive  diuresis (roughly 13 L) and vasodilation.  Will not start beta-blockers now since he is decompensated, but will start Entresto and spironolactone and SGLT2 inhibitor.  Watch electrolytes closely.  Will need social support system in place to make sure that he continues getting his medications.  Elevated troponin is mild and adynamic, consistent with heart failure exacerbation. 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 AKI: Due to reduced cardiac output.  Baseline creatinine seems to be around 1.2. LV thrombus: Start oral anticoagulant. Smoking: Encouraged cessation.   Risk Assessment/Risk Scores:        New York Heart Association (NYHA) Functional Class NYHA Class IV        For questions or updates, please contact [facility] Please consult [email or web address] for contact info under    Signed, Thurmon Fair, MD  07/18/2023 11:02 AM

## 2023-07-18 NOTE — Progress Notes (Signed)
Echocardiogram 2D Echocardiogram has been performed.  Warren Lacy Kailly Richoux RDCS 07/18/2023, 9:33 AM  Notified Drs Shirlee Latch and Izora Ribas for stat read

## 2023-07-18 NOTE — Progress Notes (Signed)
[facility] Progress Note  Patient: Hector Manning     WUJ:[id number]  DOA: 07/18/2023   PCP: Patient, No Pcp Per       Brief hospital course: 58 year old male with hypertension, congestive heart failure, iron deficiency anemia who presented to the hospital with left-sided chest pain which was pressure-like.  He was noted to have increased lower extremity edema and when asked, stated that he was unable to obtain the Lasix that was prescribed couple of weeks ago for any of his pressure medications.  He states he lost all of the prescriptions that were given to him on that date  Subjective:  Admits to cough runny nose and sore throat.  Assessment and Plan: Principal Problem:   Acute combined systolic and diastolic congestive heart failure -Chest x-ray revealed bilateral effusions with bibasilar atelectasis or infiltrate - Patient needs to be on appropriate regimen and has not been taking medications at home - Appreciate cardiology eval - 2D echo reveals an EF of less than 20%, grade 2 diastolic dysfunction, moderately reduced RV function, bilateral atrial dilatation moderate tricuspid regurgitation - Active Problems:  Mild acute thrombocytopenia -Follow  History of alcohol and nicotine abuse - States "he quit everything about 3 months ago"   No insurance and no PCP - TOC consulted    Code Status: Full Code Total time on patient care: 40 min DVT prophylaxis:  enoxaparin (LOVENOX) injection 40 mg Start: 07/18/23 1000 SCDs Start: 07/18/23 0335 Place TED hose Start: 07/18/23 0335     Objective:   Vitals:   07/18/23 0900 07/18/23 0930 07/18/23 1011 07/18/23 1100  BP: (!) 123/92 (!) 121/107 (!) 130/98 108/82  Pulse: 90 90 90 88  Resp:  18 15 18   Temp:   98.1 F (36.7 C)   TempSrc:   Oral   SpO2: 100% 98% 98% 100%  Weight:      Height:       Filed Weights   07/18/23 0643  Weight: 115.7 kg   Exam: General exam: Appears comfortable  HEENT: oral mucosa  moist Respiratory system: Crackles at bases Cardiovascular system: S1 & S2 heard  Gastrointestinal system: Abdomen soft, non-tender, nondistended. Normal bowel sounds   Extremities: No cyanosis, clubbing or edema Psychiatry:  Mood & affect appropriate.    CBC: Recent Labs  Lab 07/18/23 0100 07/18/23 0449  WBC 3.8* 4.1  HGB 14.8 14.1  HCT 49.3 46.2  MCV 91.3 89.5  PLT 129* 131*   Basic Metabolic Panel: Recent Labs  Lab 07/18/23 0100 07/18/23 0449  NA 139 137  K 4.2 4.0  CL 107 106  CO2 22 22  GLUCOSE 91 87  BUN 13 13  CREATININE 1.18 1.36*  CALCIUM 8.9 8.7*     Scheduled Meds:  aspirin EC  81 mg Oral Daily   atorvastatin  40 mg Oral Daily   empagliflozin  10 mg Oral QAC breakfast   enoxaparin (LOVENOX) injection  40 mg Subcutaneous Daily   ferrous sulfate  325 mg Oral Q breakfast   furosemide  40 mg Intravenous BID   sacubitril-valsartan  1 tablet Oral BID   sodium chloride flush  3 mL Intravenous Q12H   sodium chloride flush  3 mL Intravenous Q12H   spironolactone  12.5 mg Oral Daily    Imaging and lab data personally reviewed   Author: Calvert Cantor  07/18/2023 11:29 AM  To contact [facility]>   Check the care team in [facility] and look for the attending/consulting [facility] provider listed  Log into [email or web address] and use [facility] universal password   Go to> "[facility]"  and find provider  If you still have difficulty reaching the provider, please page the [facility] (Director on Call) for the Hospitalists listed on amion

## 2023-07-19 DIAGNOSIS — I5041 Acute combined systolic (congestive) and diastolic (congestive) heart failure: Secondary | ICD-10-CM | POA: Diagnosis not present

## 2023-07-19 LAB — BASIC METABOLIC PANEL
Anion gap: 7 (ref 5–15)
BUN: 16 mg/dL (ref 6–20)
CO2: 20 mmol/L — ABNORMAL LOW (ref 22–32)
Calcium: 8.3 mg/dL — ABNORMAL LOW (ref 8.9–10.3)
Chloride: 107 mmol/L (ref 98–111)
Creatinine, Ser: 1.67 mg/dL — ABNORMAL HIGH (ref 0.61–1.24)
GFR, Estimated: 48 mL/min — ABNORMAL LOW (ref >60)
Glucose, Bld: 78 mg/dL (ref 70–99)
Potassium: 3.8 mmol/L (ref 3.5–5.1)
Sodium: 134 mmol/L — ABNORMAL LOW (ref 135–145)

## 2023-07-19 MED ORDER — FUROSEMIDE 10 MG/ML IJ SOLN
40.0000 mg | Freq: Two times a day (BID) | INTRAMUSCULAR | Status: DC
  Administered 2023-07-19: 40 mg via INTRAVENOUS
  Filled 2023-07-19 (×3): qty 4

## 2023-07-19 MED ORDER — HYDROCERIN EX CREA
TOPICAL_CREAM | Freq: Two times a day (BID) | CUTANEOUS | Status: DC
  Filled 2023-07-19: qty 113

## 2023-07-19 NOTE — Progress Notes (Signed)
Mobility Specialist - Progress Note Nurse requested Mobility Specialist to perform oxygen saturation test with pt which includes removing pt from oxygen both at rest and while ambulating.  Below are the results from that testing.     Patient Saturations on Room Air at Rest = spO2 91%  Patient Saturations on Room Air while Ambulating = sp02 90-92% .   At end of testing pt left in room on RA of oxygen.  Reported results to nurse.     07/19/23 1012  Mobility  Activity Ambulated independently in hallway  Level of Assistance Independent  Assistive Device None  Distance Ambulated (ft) 180 ft  Range of Motion/Exercises Active  Activity Response Tolerated well  Mobility Referral Yes  Mobility visit 1 Mobility  Mobility Specialist Start Time (ACUTE ONLY) 1000  Mobility Specialist Stop Time (ACUTE ONLY) 1012  Mobility Specialist Time Calculation (min) (ACUTE ONLY) 12 min   Pt was found sitting EOB ready to ambulate. Pt c/o feeling dizzy when going from STS. Pt went to sit EOB again due to dizziness. Afterwards pt ambulated in hallway, c/o "stuffed" nose. At EOS returned to sit EOB with all needs met. Call bell in reach.  Billey Chang Mobility Specialist

## 2023-07-19 NOTE — Progress Notes (Signed)
[facility] Progress Note  Patient: Hector Manning     ZOX:[id number]  DOA: 07/18/2023   PCP: Patient, No Pcp Per       Brief hospital course: 58 year old male with hypertension, congestive heart failure, iron deficiency anemia who presented to the hospital with left-sided chest pain which was pressure-like.  He was noted to be in acute heart failure. Influenza, COVID, respiratory syncytial virus and respiratory panel negative  Subjective:  Feels that his dyspnea is improving and pedal edema is better however still feels short of breath when he walks.  Continues to have a cough. Influenza, COVID, respiratory syncytial virus and respiratory panel negative.  Assessment and Plan: Principal Problem:   Acute combined systolic and diastolic congestive heart failure -Chest x-ray revealed bilateral effusions with bibasilar atelectasis or infiltrate - Patient needs to be on appropriate regimen and has not been taking medications at home - Appreciate cardiology eval - 2D echo is worse than his prior-reveals an EF of less than 20%, grade 2 diastolic dysfunction, moderately reduced RV function, bilateral atrial dilatation moderate tricuspid regurgitation -Will continue Lasix for at least 1 more day as recommended by cardiology -91% on room air when ambulating - High risk for readmission-needs transitions of care assistance with finding a PCP and getting at least 1 month of medications for 3 - Active Problems:  Mild acute thrombocytopenia -Follow  History of alcohol and nicotine abuse - States "he quit everything about 3 months ago"   No insurance and no PCP - TOC consulted to assist    Code Status: Full Code Total time on patient care: 30 min DVT prophylaxis:  enoxaparin (LOVENOX) injection 40 mg Start: 07/18/23 1000 SCDs Start: 07/18/23 0335 Place TED hose Start: 07/18/23 0335     Objective:   Vitals:   07/19/23 0420 07/19/23 0500 07/19/23 1257 07/19/23 1319  BP: 128/89   122/80 114/85  Pulse: 89  90 72  Resp: 20   16  Temp: 97.8 F (36.6 C)  98 F (36.7 C) 97.7 F (36.5 C)  TempSrc: Oral  Oral Oral  SpO2: 99%  99% 100%  Weight:  105.4 kg    Height:       Filed Weights   07/18/23 0643 07/18/23 1612 07/19/23 0500  Weight: 115.7 kg 108 kg 105.4 kg   Exam: General exam: Appears comfortable  HEENT: oral mucosa moist Respiratory system: Crackles in left lung base Cardiovascular system: S1 & S2 heard  Gastrointestinal system: Abdomen soft, non-tender, nondistended. Normal bowel sounds   Extremities: No cyanosis, clubbing or edema Psychiatry:  Mood & affect appropriate.    CBC: Recent Labs  Lab 07/18/23 0100 07/18/23 0449  WBC 3.8* 4.1  HGB 14.8 14.1  HCT 49.3 46.2  MCV 91.3 89.5  PLT 129* 131*   Basic Metabolic Panel: Recent Labs  Lab 07/18/23 0100 07/18/23 0449 07/19/23 0533  NA 139 137 134*  K 4.2 4.0 3.8  CL 107 106 107  CO2 22 22 20*  GLUCOSE 91 87 78  BUN 13 13 16   CREATININE 1.18 1.36* 1.67*  CALCIUM 8.9 8.7* 8.3*     Scheduled Meds:  aspirin EC  81 mg Oral Daily   atorvastatin  40 mg Oral Daily   empagliflozin  10 mg Oral QAC breakfast   enoxaparin (LOVENOX) injection  40 mg Subcutaneous Daily   ferrous sulfate  325 mg Oral Q breakfast   furosemide  40 mg Intravenous BID   hydrocerin   Topical BID  influenza vac split trivalent PF  0.5 mL Intramuscular Tomorrow-1000   pneumococcal 20-valent conjugate vaccine  0.5 mL Intramuscular Tomorrow-1000   sacubitril-valsartan  1 tablet Oral BID   sodium chloride flush  3 mL Intravenous Q12H   sodium chloride flush  3 mL Intravenous Q12H   spironolactone  12.5 mg Oral Daily   traMADol  25 mg Oral Once    Imaging and lab data personally reviewed   Author: Calvert Cantor  07/19/2023 2:22 PM  To contact [facility]>   Check the care team in [facility] and look for the attending/consulting [facility] provider listed  Log into [email or web address] and use [place] universal password    Go to> "[facility]"  and find provider  If you still have difficulty reaching the provider, please page the [facility] (Director on Call) for the Hospitalists listed on amion

## 2023-07-19 NOTE — Progress Notes (Signed)
   Patient Name: LOCKWOOD SCHNUR Date of Encounter: 07/19/2023 [facility] Cardiologist: Christell Constant, MD   Interval Summary  .    Improving with diuresis.  Blood pressure much better.  Good diuresis, slight increase in renal parameters.  Borderline hyponatremia, otherwise electrolytes okay.  No significant arrhythmia on telemetry. Weight has dropped a lot, now down to 105 kg, roughly 3 kg above target weight  Vital Signs .    Vitals:   07/18/23 2015 07/19/23 0103 07/19/23 0420 07/19/23 0500  BP: (!) 135/99 (!) 133/100 128/89   Pulse: 91 92 89   Resp:   20   Temp: 97.9 F (36.6 C) 97.9 F (36.6 C) 97.8 F (36.6 C)   TempSrc: Oral Oral Oral   SpO2: 100% 100% 99%   Weight:    105.4 kg  Height:        Intake/Output Summary (Last 24 hours) at 07/19/2023 1007 Last data filed at 07/18/2023 2313 Gross per 24 hour  Intake 240 ml  Output 1500 ml  Net -1260 ml      07/19/2023    5:00 AM 07/18/2023    4:12 PM 07/18/2023    6:43 AM  Last 3 Weights  Weight (lbs) 232 lb 6.4 oz 238 lb 1.6 oz 255 lb  Weight (kg) 105.416 kg 108 kg 115.667 kg      Telemetry/ECG    Sinus rhythm- Personally Reviewed  Physical Exam .   GEN: No acute distress.   Neck: 6 cm JVD Cardiac: RRR, no murmurs, rubs, or gallops.  Respiratory: Rales especially in the right base, but mostly clear GI: Soft, nontender, non-distended  MS: Trace bilateral leg edema  Assessment & Plan .     Continue IV diuretics another 24 hours. The challenge will be to get him all the necessary medications for treatment of his cardiomyopathy and left ventricular thrombus including expensive medication such as Entresto, Jardiance, Eliquis. Will need social worker support and would like to make him an early appointment in the heart failure TOC clinic.  For questions or updates, please contact [facility] Please consult [email or web address] for contact info under        Signed, Thurmon Fair, MD

## 2023-07-20 ENCOUNTER — Encounter (HOSPITAL_COMMUNITY): Payer: Self-pay | Admitting: Internal Medicine

## 2023-07-20 DIAGNOSIS — I5041 Acute combined systolic (congestive) and diastolic (congestive) heart failure: Secondary | ICD-10-CM | POA: Diagnosis not present

## 2023-07-20 MED ORDER — SENNA 8.6 MG PO TABS
2.0000 | ORAL_TABLET | Freq: Once | ORAL | Status: AC
  Administered 2023-07-20: 17.2 mg via ORAL
  Filled 2023-07-20: qty 2

## 2023-07-20 MED ORDER — APIXABAN 5 MG PO TABS: 5.0000 mg | ORAL_TABLET | Freq: Two times a day (BID) | ORAL | Status: DC

## 2023-07-20 MED ORDER — APIXABAN 5 MG PO TABS
5.0000 mg | ORAL_TABLET | Freq: Two times a day (BID) | ORAL | Status: DC
  Administered 2023-07-20 – 2023-07-23 (×7): 5 mg via ORAL
  Filled 2023-07-20 (×7): qty 1

## 2023-07-20 MED ORDER — FUROSEMIDE 40 MG PO TABS
40.0000 mg | ORAL_TABLET | Freq: Every day | ORAL | Status: DC
Start: 2023-07-20 — End: 2023-07-23
  Administered 2023-07-20 – 2023-07-23 (×4): 40 mg via ORAL
  Filled 2023-07-20 (×4): qty 1

## 2023-07-20 NOTE — Progress Notes (Signed)
Heart Failure Nurse Navigator Progress Note  PCP: Patient, No Pcp Per PCP-Cardiologist: Chandrasekhar Admission Diagnosis: Acute congestive heart failure Admitted from: Home via EMS  Presentation:   Hector Manning presented with shortness of breath , bilateral leg swelling, and pain for 2 weeks, was recently in ED 2 weeks ago was discharged with a diuretic, patient never filled prescription. BP on admission 160/100, HR 109, BNP 2243, CXR with cardiomegaly and vascular congestion,   Called patient in his room at [facility] [phone]) education on the sign and symptoms of heart failure, daily weights, when to call his doctor or go to the ED, Diet/ fluid restrictions , patient reported to eating out a lot, does drink normally 3 ( 40 oz beers a day) however is working on quitting that. Continued education on taking all medication as prescribed and attending all medical appointments. Patient verbalized his understanding of education, a HF TOC appointment was scheduled for 07/27/2023 @ 3 pm. Patient stated that his sister would drive him to the appointment.   ECHO/ LVEF: >20%  Clinical Course:  History reviewed. No pertinent past medical history.   Social History   Socioeconomic History   Marital status: Single    Spouse name: Not on file   Number of children: 4   Years of education: Not on file   Highest education level: High school graduate  Occupational History   Occupation: [employer]    Comment: receiving NO checks at this moment  Tobacco Use   Smoking status: Former    Current packs/day: 0.00    Types: Cigarettes    Quit date: 04/13/2022    Years since quitting: 1.2   Smokeless tobacco: Never  Vaping Use   Vaping status: Never Used  Substance and Sexual Activity   Alcohol use: Not Currently    Comment: couple beers a day   Drug use: No   Sexual activity: Not on file  Other Topics Concern   Not on file  Social History Narrative   Not on file   Social Drivers of Health    Financial Resource Strain: Medium Risk (07/20/2023)   Overall Financial Resource Strain (CARDIA)    Difficulty of Paying Living Expenses: Somewhat hard  Food Insecurity: No Food Insecurity (07/19/2023)   Hunger Vital Sign    Worried About Running Out of Food in the Last Year: Never true    Ran Out of Food in the Last Year: Never true  Transportation Needs: No Transportation Needs (07/20/2023)   PRAPARE - Administrator, Civil Service (Medical): No    Lack of Transportation (Non-Medical): No  Physical Activity: Not on file  Stress: Not on file  Social Connections: Not on file   Education Assessment and Provision:  Detailed education and instructions provided on heart failure disease management including the following:  Signs and symptoms of Heart Failure When to call the physician Importance of daily weights Low sodium diet Fluid restriction Medication management Anticipated future follow-up appointments  Patient education given on each of the above topics.  Patient acknowledges understanding via teach back method and acceptance of all instructions.  Education Materials:  "Living Better With Heart Failure" Booklet, HF zone tool, & Daily Weight Tracker Tool.  Patient has scale at home: Yes Patient has pill box at home: NA    High Risk Criteria for Readmission and/or Poor Patient Outcomes: Heart failure hospital admissions (last 6 months): 1  No Show rate: 17% Difficult social situation: Lives with his Mother, sister  drives him to appointments.  Demonstrates medication adherence: No Primary Language: English Literacy level: Reading, writing, and comprehension  Barriers of Care:   Medication/ appointment compliance Diet/ fluid restrictions/ daily weights Alcohol cessation    Considerations/Referrals:   Referral made to Heart Failure Pharmacist Stewardship: No Referral made to Heart Failure CSW/NCM TOC: Yes, medication costs, Substance cessation Referral  made to Heart & Vascular TOC clinic: Yes, 07/27/2023 @ 3 pm.   Items for Follow-up on DC/TOC: Medication/ appointment compliance Diet/ fluid restrictions ( fast food)  Alcohol cessation Daily weights Continued HF education   Rhae Hammock, BSN, RN Heart Failure Print production planner Chat Only

## 2023-07-20 NOTE — H&P (Signed)
MATCH MEDICATION ASSISTANCE CARD Pharmacies please call [phone] for claim processing assistance.  Rx BIN: [id number] Rx Group: [id number] Rx PCN: [id number] Relationship Code: 1 Person Code: 01  Patient ID (MRN):[id number]     Patient Name: Hector Manning, Hector Manning    Patient DOB: [id number]    Discharge Date: 07/20/2023   Expiration Date: 07/27/2023 (must be filled within 7 days of discharge)   Dear [name] You have been approved to have the prescriptions written by your discharging physician filled through our [facility] (Medication Assistance Through [facility]) program. This program allows for a one-time (no refills) 34-day supply of selected medications for a low copay amount.  The copay is $3.00 per prescription. For instance, if you have one prescription, you will pay $3.00; for two prescriptions, you pay $6.00; for three prescriptions, you pay $9.00; and so on. Only certain pharmacies are participating in this program with [facility]. You will need to select one of the pharmacies from the attached lists and take your prescriptions, this letter, and your photo ID to one of the participating pharmacies.  We are excited that you are able to use the [facility] program to get your medications. These prescriptions must be filled within 7 days of hospital discharge or they will no longer be valid for the [facility] program. Should you have any problems with your prescriptions please contact your case management team member at [phone] for [name] or [phone] for [name].  Thank you, [facility]

## 2023-07-20 NOTE — Progress Notes (Addendum)
Patient Name: Hector Manning Date of Encounter: 07/20/2023 [facility] Cardiologist: Christell Constant, MD   Interval Summary  .    Brisk diuresis with significant improvement in symptoms.  Ambulated yesterday and done well.  Titrating off of supplemental oxygen.  No chest pain.  Vital Signs .    Vitals:   07/19/23 1257 07/19/23 1319 07/19/23 2013 07/20/23 0500  BP: 122/80 114/85 127/85 (!) 138/96  Pulse: 90 72 77 91  Resp:  16 20   Temp: 98 F (36.7 C) 97.7 F (36.5 C) 98 F (36.7 C) 98 F (36.7 C)  TempSrc: Oral Oral  Oral  SpO2: 99% 100% 100% 100%  Weight:    97.7 kg  Height:        Intake/Output Summary (Last 24 hours) at 07/20/2023 0902 Last data filed at 07/20/2023 0800 Gross per 24 hour  Intake 1207 ml  Output 6200 ml  Net -4993 ml      07/20/2023    5:00 AM 07/19/2023    5:00 AM 07/18/2023    4:12 PM  Last 3 Weights  Weight (lbs) 215 lb 6.2 oz 232 lb 6.4 oz 238 lb 1.6 oz  Weight (kg) 97.7 kg 105.416 kg 108 kg      Telemetry/ECG    Sinus rhythm heart rates in the 80s- Personally Reviewed  CV Studies    Echocardiogram 07/18/2023 1. Complex, layered LV apical thrombus with the largest dimension  measuring 2.7x1cm. Left ventricular ejection fraction, by estimation, is  <20%. The left ventricle has severely decreased function. The left  ventricle demonstrates global hypokinesis. The  left ventricular internal cavity size was severely dilated. Left  ventricular diastolic parameters are consistent with Grade III diastolic  dysfunction (restrictive).   2. Right ventricular systolic function is moderately reduced. The right  ventricular size is severely enlarged. There is normal pulmonary artery  systolic pressure. The estimated right ventricular systolic pressure is  34.4 mmHg.   3. Left atrial size was severely dilated.   4. Right atrial size was severely dilated.   5. Large pleural effusion in the left lateral region.   6. Mitral  regurgitation related to posterior restriction, suspect degree  is underestimated; eccentric jet on A2c view. The mitral valve is  abnormal. Mild to moderate mitral valve regurgitation.   7. Tricuspid valve regurgitation is moderate.   8. The aortic valve has an indeterminant number of cusps. Aortic valve  regurgitation is not visualized.   9. The inferior vena cava is dilated in size with <50% respiratory  variability, suggesting right atrial pressure of 15 mmHg.   Right left heart catheterization 04/29/2022 Normal right heart hemodynamics with low right and left heart diastolic filling pressures, mean PA pressure of 17 mmHg, LVEDP 8 mmHg, wedge pressure of 5 mmHg. 2.  Widely patent coronary arteries with no obstructive CAD   Recommendations: Medical therapy for nonischemic cardiomyopathy/heart failure with reduced ejection fraction    Physical Exam .   GEN: No acute distress.   Neck: No JVD Cardiac: RRR, no murmurs, rubs, or gallops.  Respiratory: minimal crackles GI: Soft, nontender, non-distended  MS: No edema  Patient Profile    Hector Manning is a 58 y.o. male has hx of nonischemic cardiomyopathy with reduced EF, previous alcohol and nicotine abuse (quit 3 months ago, hypertension.  Admitted for CHF exacerbation  Assessment & Plan .     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  Reports no prior drug use.  03/2022 heart cath showed low right and left diastolic filling pressures with widely patent coronary anatomy.  In the past year he was lost to follow-up and noncompliant with medications but now has insurance.  EF here shows progressive decline less than 20% with moderately reduced RV function.  Normal PASP.  Grade 3 diastolic dysfunction.  He has been briskly diuresed on IV Lasix 40 mg twice daily with almost 6 L in the last 24 hours.  Euvolemic now with no complaints.  Will ask nurse to  ambulate patient. Transition to p.o. Lasix 40mg  daily with instructions to take extra dose for increased weight, shortness of breath, swelling. Dry weight around 215 pounds today.  We discussed ongoing management of CHF including BP weight monitoring. GDMT: Started on Jardiance 10 mg, spironolactone 12.5 mg, Entresto 49-51 mg twice daily.  Consider starting Toprol-XL outpatient.  Repeat labs at follow-up Was not started on Eliquis yet, start Eliquis 5mg  BID.  Will discuss with MD about duration. Need social services support.  But reports having insurance now.  AKI Currently 1.67.  Follow outpatient.  Valvular disease Moderate MR noted however felt to be underestimated given eccentric jet.  Valve was abnormal.  Moderate TR.  Continue to monitor.  Prior alcohol abuse, nicotine dependence Reported drinking 40oz beers x 3 daily.  Now has quit.  Will arrange follow-up with heart failure TOC.  Message sent to [facility].    For questions or updates, please contact [facility] Please consult [email or web address] for contact info under        Signed, Abagail Kitchens, PA-C

## 2023-07-20 NOTE — Plan of Care (Signed)
  Problem: Clinical Measurements: Goal: Respiratory complications will improve Outcome: Progressing   Problem: Pain Managment: Goal: General experience of comfort will improve and/or be controlled Outcome: Progressing   Problem: Safety: Goal: Ability to remain free from injury will improve Outcome: Progressing

## 2023-07-20 NOTE — TOC Transition Note (Addendum)
Transition of Care [facility]) - Discharge Note   Patient Details  Name: Hector Manning MRN: [id number] Date of Birth: [birth year]  Transition of Care [facility]) CM/SW Contact:  Howell Rucks, RN Phone Number: 07/20/2023, 10:26 AM   Clinical Narrative:  [facility] consult for Medication Assistance and PCP needs. Call to patient's room, introduced role of TOC/NCM and reviewed for dc planning Pt confirmed he can pay $3.00 copay, patient eligible for MATCH, will arrange meds to bed. Patient reports he lives with his mother, no  current home care services or home DME, patient reports he currently is living with his mother, confirmed transportation is available at discharge.  -10:37am TOC consult for PCP needs. Appt scheduled at [facility], Friday, Aug 14, 2023 at 3:20pm, added to AVS.       Barriers to Discharge: Barriers Resolved   Patient Goals and CMS Choice Patient states their goals for this hospitalization and ongoing recovery are:: return home, currently living with his mother          Discharge Placement                       Discharge Plan and Services Additional resources added to the After Visit Summary for                                       Social Drivers of Health (SDOH) Interventions SDOH Screenings   Food Insecurity: No Food Insecurity (07/19/2023)  Housing: Low Risk  (07/20/2023)  Recent Concern: Housing - High Risk (07/19/2023)  Transportation Needs: No Transportation Needs (07/20/2023)  Utilities: Not At Risk (07/19/2023)  Alcohol Screen: Low Risk  (07/20/2023)  Financial Resource Strain: Medium Risk (07/20/2023)  Tobacco Use: Medium Risk (07/18/2023)     Readmission Risk Interventions    07/20/2023   10:25 AM  Readmission Risk Prevention Plan  Post Dischage Appt Complete  Medication Screening Complete  Transportation Screening Complete

## 2023-07-20 NOTE — Discharge Instructions (Signed)
Information on my medicine - ELIQUIS (apixaban)  This medication education was reviewed with me or my healthcare representative as part of my discharge preparation.   Why was Eliquis prescribed for you? Eliquis was prescribed for you to reduce the risk of forming blood clots that can cause a stroke if you have a medical condition called atrial fibrillation (a type of irregular heartbeat) OR to reduce the risk of a blood clots forming after orthopedic surgery.  What do You need to know about Eliquis ? Take your Eliquis TWICE DAILY - one tablet in the morning and one tablet in the evening with or without food.  It would be best to take the doses about the same time each day.  If you have difficulty swallowing the tablet whole please discuss with your pharmacist how to take the medication safely.  Take Eliquis exactly as prescribed by your doctor and DO NOT stop taking Eliquis without talking to the doctor who prescribed the medication.  Stopping may increase your risk of developing a new clot or stroke.  Refill your prescription before you run out.  After discharge, you should have regular check-up appointments with your healthcare provider that is prescribing your Eliquis.  In the future your dose may need to be changed if your kidney function or weight changes by a significant amount or as you get older.  What do you do if you miss a dose? If you miss a dose, take it as soon as you remember on the same day and resume taking twice daily.  Do not take more than one dose of ELIQUIS at the same time.  Important Safety Information A possible side effect of Eliquis is bleeding. You should call your healthcare provider right away if you experience any of the following: Bleeding from an injury or your nose that does not stop. Unusual colored urine (red or dark brown) or unusual colored stools (red or black). Unusual bruising for unknown reasons. A serious fall or if you hit your head (even if  there is no bleeding).  Some medicines may interact with Eliquis and might increase your risk of bleeding or clotting while on Eliquis. To help avoid this, consult your healthcare provider or pharmacist prior to using any new prescription or non-prescription medications, including herbals, vitamins, non-steroidal anti-inflammatory drugs (NSAIDs) and supplements.  This website has more information on Eliquis (apixaban): [email or web address].com.cy. Information on my medicine - ELIQUIS (apixaban)  This medication education was reviewed with me or my healthcare representative as part of my discharge preparation.  The pharmacist that spoke with me during my hospital stay was:  Herby Abraham, [facility]  Why was Eliquis prescribed for you? Eliquis was prescribed for you to reduce the risk of forming blood clots that can cause a stroke if you have a medical condition called atrial fibrillation (a type of irregular heartbeat) OR to reduce the risk of a blood clots forming after orthopedic surgery.  What do You need to know about Eliquis ? Take your Eliquis TWICE DAILY - one tablet in the morning and one tablet in the evening with or without food.  It would be best to take the doses about the same time each day.  If you have difficulty swallowing the tablet whole please discuss with your pharmacist how to take the medication safely.  Take Eliquis exactly as prescribed by your doctor and DO NOT stop taking Eliquis without talking to the doctor who prescribed the medication.  Stopping may increase your risk of  developing a new clot or stroke.  Refill your prescription before you run out.  After discharge, you should have regular check-up appointments with your healthcare provider that is prescribing your Eliquis.  In the future your dose may need to be changed if your kidney function or weight changes by a significant amount or as you get older.  What do you do if you miss a dose? If you miss a dose, take it  as soon as you remember on the same day and resume taking twice daily.  Do not take more than one dose of ELIQUIS at the same time.  Important Safety Information A possible side effect of Eliquis is bleeding. You should call your healthcare provider right away if you experience any of the following: Bleeding from an injury or your nose that does not stop. Unusual colored urine (red or dark brown) or unusual colored stools (red or black). Unusual bruising for unknown reasons. A serious fall or if you hit your head (even if there is no bleeding).  Some medicines may interact with Eliquis and might increase your risk of bleeding or clotting while on Eliquis. To help avoid this, consult your healthcare provider or pharmacist prior to using any new prescription or non-prescription medications, including herbals, vitamins, non-steroidal anti-inflammatory drugs (NSAIDs) and supplements.  This website has more information on Eliquis (apixaban): www.[employer].com.cy.

## 2023-07-20 NOTE — Plan of Care (Signed)
  Problem: Education: Goal: Ability to verbalize understanding of medication therapies will improve Outcome: Progressing   Problem: Clinical Measurements: Goal: Diagnostic test results will improve Outcome: Progressing Goal: Respiratory complications will improve Outcome: Progressing   Problem: Pain Managment: Goal: General experience of comfort will improve and/or be controlled Outcome: Progressing   Problem: Safety: Goal: Ability to remain free from injury will improve Outcome: Progressing

## 2023-07-20 NOTE — Progress Notes (Signed)
[facility] Progress Note  Patient: Hector Manning     WUJ:[id number]  DOA: 07/18/2023   PCP: Patient, No Pcp Per       Brief hospital course: 58 year old male with hypertension, congestive heart failure, iron deficiency anemia who presented to the hospital with left-sided chest pain which was pressure-like.  He was noted to be in acute heart failure. Influenza, COVID, respiratory syncytial virus and respiratory panel negative  Subjective:  No complaints. Have spoken with him extensively daily about his cardiac issues and the necessity to f/u with cardiology, find a PCP and take meds as ordered. He seems to understand and agree.  Assessment and Plan: Principal Problem:   Acute combined systolic and diastolic congestive heart failure -Chest x-ray revealed bilateral effusions with bibasilar atelectasis or infiltrate - Appreciate cardiology eval - 2D echo is worse than his prior-reveals an EF of less than 20%, grade 2 diastolic dysfunction, moderately reduced RV function, bilateral atrial dilatation moderate tricuspid regurgitation - Patient needs to be on appropriate regimen and has not been taking medications at home- we discussed his drop in EF and the need for him to appropriately take medications and having a physician - High risk for readmission-needs transitions of care assistance with finding a PCP and getting at least 1 month of medications for free - cardiology assisting with diuresis and is suggesting that he will need to stay until at least Wed to continue diuresis- switched to oral Lasix - cardiology will arrange f/u with heart failure clinic  Active Problems:  Ventricular thrombus - Eliquis started by cardiology today  Mild acute thrombocytopenia -Follow  History of alcohol and nicotine abuse - States "he quit everything about 3 months ago"   No insurance and no PCP - TOC consulted to assist      Code Status: Full Code Total time on patient care: 30  min DVT prophylaxis:  SCDs Start: 07/18/23 0335 Place TED hose Start: 07/18/23 0335 apixaban (ELIQUIS) tablet 5 mg     Objective:   Vitals:   07/19/23 1319 07/19/23 2013 07/20/23 0500 07/20/23 0918  BP: 114/85 127/85 (!) 138/96 108/82  Pulse: 72 77 91   Resp: 16 20    Temp: 97.7 F (36.5 C) 98 F (36.7 C) 98 F (36.7 C)   TempSrc: Oral  Oral   SpO2: 100% 100% 100% 100%  Weight:   97.7 kg   Height:       Filed Weights   07/18/23 1612 07/19/23 0500 07/20/23 0500  Weight: 108 kg 105.4 kg 97.7 kg   Exam: General exam: Appears comfortable  HEENT: oral mucosa moist Respiratory system: CTA b/l  Cardiovascular system: S1 & S2 heard  Gastrointestinal system: Abdomen soft, non-tender, nondistended. Normal bowel sounds   Extremities: No cyanosis, clubbing or edema Psychiatry:  Mood & affect appropriate.    CBC: Recent Labs  Lab 07/18/23 0100 07/18/23 0449  WBC 3.8* 4.1  HGB 14.8 14.1  HCT 49.3 46.2  MCV 91.3 89.5  PLT 129* 131*   Basic Metabolic Panel: Recent Labs  Lab 07/18/23 0100 07/18/23 0449 07/19/23 0533  NA 139 137 134*  K 4.2 4.0 3.8  CL 107 106 107  CO2 22 22 20*  GLUCOSE 91 87 78  BUN 13 13 16   CREATININE 1.18 1.36* 1.67*  CALCIUM 8.9 8.7* 8.3*     Scheduled Meds:  apixaban  5 mg Oral BID   aspirin EC  81 mg Oral Daily   atorvastatin  40 mg Oral  Daily   empagliflozin  10 mg Oral QAC breakfast   ferrous sulfate  325 mg Oral Q breakfast   furosemide  40 mg Oral Daily   hydrocerin   Topical BID   sacubitril-valsartan  1 tablet Oral BID   sodium chloride flush  3 mL Intravenous Q12H   sodium chloride flush  3 mL Intravenous Q12H   spironolactone  12.5 mg Oral Daily    Imaging and lab data personally reviewed   Author: Calvert Cantor  07/20/2023 12:24 PM  To contact [facility]>   Check the care team in [facility] and look for the attending/consulting [facility] provider listed  Log into [email or web address] and use [place]'s universal password   Go  to> "[facility]"  and find provider  If you still have difficulty reaching the provider, please page the [facility] (Director on Call) for the Hospitalists listed on amion

## 2023-07-20 NOTE — Progress Notes (Signed)
Mobility Specialist - Progress Note  (RA) Pre-mobility: 92% SpO2 During mobility: 91% SpO2 Post-mobility: 94% SPO2   07/20/23 1112  Mobility  Activity Ambulated independently in hallway  Level of Assistance Standby assist, set-up cues, supervision of patient - no hands on  Assistive Device None  Distance Ambulated (ft) 200 ft  Range of Motion/Exercises Active  Activity Response Tolerated fair  Mobility Referral Yes  Mobility visit 1 Mobility  Mobility Specialist Start Time (ACUTE ONLY) 1100  Mobility Specialist Stop Time (ACUTE ONLY) 1112  Mobility Specialist Time Calculation (min) (ACUTE ONLY) 12 min   Pt was found in bed and agreeable to ambulate. C/o LLE pain during session which caused him to bed unsteady. At EOS returned to sit EOB with all needs met. Call bell in reach and RN notified.  Billey Chang Mobility Specialist

## 2023-07-21 DIAGNOSIS — I5041 Acute combined systolic (congestive) and diastolic (congestive) heart failure: Secondary | ICD-10-CM | POA: Diagnosis not present

## 2023-07-21 LAB — CBC
HCT: 49.7 % (ref 39.0–52.0)
Hemoglobin: 15.4 g/dL (ref 13.0–17.0)
MCH: 26.9 pg (ref 26.0–34.0)
MCHC: 31 g/dL (ref 30.0–36.0)
MCV: 86.9 fL (ref 80.0–100.0)
Platelets: 106 10*3/uL — ABNORMAL LOW (ref 150–400)
RBC: 5.72 MIL/uL (ref 4.22–5.81)
RDW: 17.8 % — ABNORMAL HIGH (ref 11.5–15.5)
WBC: 4 10*3/uL (ref 4.0–10.5)
nRBC: 0 % (ref 0.0–0.2)

## 2023-07-21 LAB — BASIC METABOLIC PANEL
Anion gap: 7 (ref 5–15)
BUN: 15 mg/dL (ref 6–20)
CO2: 25 mmol/L (ref 22–32)
Calcium: 8.3 mg/dL — ABNORMAL LOW (ref 8.9–10.3)
Chloride: 106 mmol/L (ref 98–111)
Creatinine, Ser: 1.38 mg/dL — ABNORMAL HIGH (ref 0.61–1.24)
GFR, Estimated: >60 mL/min (ref >60)
Glucose, Bld: 81 mg/dL (ref 70–99)
Potassium: 3.9 mmol/L (ref 3.5–5.1)
Sodium: 138 mmol/L (ref 135–145)

## 2023-07-21 MED ORDER — SENNA 8.6 MG PO TABS: 2.0000 | ORAL_TABLET | Freq: Every day | ORAL | Status: DC | PRN

## 2023-07-21 MED ORDER — SENNA 8.6 MG PO TABS
1.0000 | ORAL_TABLET | Freq: Once | ORAL | Status: AC
  Administered 2023-07-21: 8.6 mg via ORAL
  Filled 2023-07-21: qty 1

## 2023-07-21 MED ORDER — METOPROLOL SUCCINATE ER 50 MG PO TB24
25.0000 mg | ORAL_TABLET | Freq: Every day | ORAL | Status: DC
  Administered 2023-07-21 – 2023-07-23 (×3): 25 mg via ORAL
  Filled 2023-07-21 (×3): qty 1

## 2023-07-21 MED ORDER — SENNA 8.6 MG PO TABS
1.0000 | ORAL_TABLET | Freq: Every day | ORAL | Status: DC | PRN
  Filled 2023-07-21: qty 1

## 2023-07-21 NOTE — Progress Notes (Signed)
[facility] Progress Note  Patient: Hector Manning     ZOX:[id number]  DOA: 07/18/2023   PCP: Patient, No Pcp Per       Brief hospital course: 58 year old male with hypertension, congestive heart failure, iron deficiency anemia who presented to the hospital with left-sided chest pain which was pressure-like.  He was noted to be in acute heart failure. Influenza, COVID, respiratory syncytial virus and respiratory panel negative.  Subjective:  States legs hurt again today. Left is worse than right.   Assessment and Plan: Principal Problem:   Acute combined systolic and diastolic congestive heart failure -Chest x-ray revealed bilateral effusions with bibasilar atelectasis or infiltrate - Appreciate cardiology eval - 2D echo is worse than his prior-reveals an EF of less than 20%, grade 2 diastolic dysfunction, moderately reduced RV function, bilateral atrial dilatation moderate tricuspid regurgitation - Patient needs to be on appropriate regimen and has not been taking medications at home- we discussed his drop in EF and the need for him to appropriately take medications and having a physician  - switched to oral Lasix on 1/20 - cardiology will arrange f/u with heart failure clinic  Active Problems:  Ventricular thrombus - Eliquis started by cardiology    Mild acute thrombocytopenia - If HIT related to Lovenox given earlier?  - as platelets are dropping and he will be discharged on Eliquis, I will hold off on dc until I see improvement  Pain in legs - now on only in knee and ankle- -- No signs of gout- mild swelling noted  History of alcohol and nicotine abuse - States "he quit everything about 3 months ago"   No insurance and no PCP High risk for readmission-needs transitions of care assistance with finding a PCP and getting at least 1 month of medications for free_- have consulted TOC-      Code Status: Full Code Total time on patient care: 30 min DVT prophylaxis:   SCDs Start: 07/18/23 0335 Place TED hose Start: 07/18/23 0335 apixaban (ELIQUIS) tablet 5 mg     Objective:   Vitals:   07/21/23 0500 07/21/23 0528 07/21/23 0900 07/21/23 1125  BP:  125/81 120/81 113/77  Pulse:  91 82 92  Resp:  18 18 19   Temp:  (!) 97.4 F (36.3 C) 98.3 F (36.8 C) 98.2 F (36.8 C)  TempSrc:  Oral Oral Oral  SpO2:  97% 98% 97%  Weight: 100 kg  94.7 kg   Height:       Filed Weights   07/20/23 0500 07/21/23 0500 07/21/23 0900  Weight: 97.7 kg 100 kg 94.7 kg   Exam: General exam: Appears comfortable  HEENT: oral mucosa moist Respiratory system: CTA b/l  Cardiovascular system: S1 & S2 heard  Gastrointestinal system: Abdomen soft, non-tender, nondistended. Normal bowel sounds   Extremities: No cyanosis, clubbing- has edema in left leg still Psychiatry:  Mood & affect appropriate.    CBC: Recent Labs  Lab 07/18/23 0100 07/18/23 0449 07/21/23 0503  WBC 3.8* 4.1 4.0  HGB 14.8 14.1 15.4  HCT 49.3 46.2 49.7  MCV 91.3 89.5 86.9  PLT 129* 131* 106*   Basic Metabolic Panel: Recent Labs  Lab 07/18/23 0100 07/18/23 0449 07/19/23 0533 07/21/23 0503  NA 139 137 134* 138  K 4.2 4.0 3.8 3.9  CL 107 106 107 106  CO2 22 22 20* 25  GLUCOSE 91 87 78 81  BUN 13 13 16 15   CREATININE 1.18 1.36* 1.67* 1.38*  CALCIUM 8.9 8.7*  8.3* 8.3*     Scheduled Meds:  apixaban  5 mg Oral BID   atorvastatin  40 mg Oral Daily   empagliflozin  10 mg Oral QAC breakfast   ferrous sulfate  325 mg Oral Q breakfast   furosemide  40 mg Oral Daily   hydrocerin   Topical BID   metoprolol succinate  25 mg Oral Daily   sacubitril-valsartan  1 tablet Oral BID   sodium chloride flush  3 mL Intravenous Q12H   sodium chloride flush  3 mL Intravenous Q12H   spironolactone  12.5 mg Oral Daily    Imaging and lab data personally reviewed   Author: Calvert Cantor  07/21/2023 5:43 PM  To contact [facility]>   Check the care team in [facility] and look for the  attending/consulting [facility] provider listed  Log into [email or web address] and use [place]'s universal password   Go to> "[facility]"  and find provider  If you still have difficulty reaching the provider, please page the [facility] (Director on Call) for the Hospitalists listed on amion

## 2023-07-21 NOTE — Plan of Care (Signed)

## 2023-07-21 NOTE — Progress Notes (Addendum)
Patient Name: Hector Manning Date of Encounter: 07/21/2023 [facility] Cardiologist: Christell Constant, MD   Interval Summary  .    Continues to diurese well.  No complaints today. Vital Signs .    Vitals:   07/20/23 1937 07/21/23 0500 07/21/23 0528 07/21/23 0900  BP: 116/79  125/81 120/81  Pulse: 93  91 82  Resp: 18  18 18   Temp: 98.5 F (36.9 C)  (!) 97.4 F (36.3 C) 98.3 F (36.8 C)  TempSrc: Oral  Oral Oral  SpO2: 98%  97% 98%  Weight:  100 kg    Height:        Intake/Output Summary (Last 24 hours) at 07/21/2023 0952 Last data filed at 07/21/2023 0800 Gross per 24 hour  Intake 1403 ml  Output 2300 ml  Net -897 ml      07/21/2023    5:00 AM 07/20/2023    5:00 AM 07/19/2023    5:00 AM  Last 3 Weights  Weight (lbs) 220 lb 7.4 oz 215 lb 6.2 oz 232 lb 6.4 oz  Weight (kg) 100 kg 97.7 kg 105.416 kg      Telemetry/ECG    Sinus rhythm heart rates in the 80s- Personally Reviewed  CV Studies    Echocardiogram 07/18/2023 1. Complex, layered LV apical thrombus with the largest dimension  measuring 2.7x1cm. Left ventricular ejection fraction, by estimation, is  <20%. The left ventricle has severely decreased function. The left  ventricle demonstrates global hypokinesis. The  left ventricular internal cavity size was severely dilated. Left  ventricular diastolic parameters are consistent with Grade III diastolic  dysfunction (restrictive).   2. Right ventricular systolic function is moderately reduced. The right  ventricular size is severely enlarged. There is normal pulmonary artery  systolic pressure. The estimated right ventricular systolic pressure is  34.4 mmHg.   3. Left atrial size was severely dilated.   4. Right atrial size was severely dilated.   5. Large pleural effusion in the left lateral region.   6. Mitral regurgitation related to posterior restriction, suspect degree  is underestimated; eccentric jet on A2c view. The mitral valve is   abnormal. Mild to moderate mitral valve regurgitation.   7. Tricuspid valve regurgitation is moderate.   8. The aortic valve has an indeterminant number of cusps. Aortic valve  regurgitation is not visualized.   9. The inferior vena cava is dilated in size with <50% respiratory  variability, suggesting right atrial pressure of 15 mmHg.   Right left heart catheterization 04/29/2022 Normal right heart hemodynamics with low right and left heart diastolic filling pressures, mean PA pressure of 17 mmHg, LVEDP 8 mmHg, wedge pressure of 5 mmHg. 2.  Widely patent coronary arteries with no obstructive CAD   Recommendations: Medical therapy for nonischemic cardiomyopathy/heart failure with reduced ejection fraction    Physical Exam .   GEN: No acute distress.   Neck: mild JVD with HJR Cardiac: RRR, no murmurs, rubs, or gallops.  Respiratory: minimal crackles GI: Soft, nontender, non-distended  MS: No edema  Patient Profile    KEONNE Manning is a 58 y.o. male has hx of nonischemic cardiomyopathy with reduced EF, previous alcohol and nicotine abuse (quit 3 months ago, hypertension.  Admitted for CHF exacerbation  Assessment & Plan .     Nonischemic cardiomyopathy with severely reduced EF  New layered LV apical thrombus 2.7 x 1cm Suspect related to prior alcohol abuse.  Reports no prior drug use.  03/2022 heart cath  showed low right and left diastolic filling pressures with widely patent coronary anatomy.  In the past year he was lost to follow-up and noncompliant with medications but now has insurance.  EF here shows progressive decline less than 20% with moderately reduced RV function.  Normal PASP.  Grade 3 diastolic dysfunction.   Looks euvolemic, still diuresing well.  -2.4 L in last 24 hours.  Diuresed well on IV Lasix 40 BID.  Transitioned to p.o. Lasix 40 mg daily yesterday. Instructions to take extra dose for increased weight, shortness of breath, swelling. Dry weight around 215-220  pounds.  We discussed ongoing management of CHF including BP weight monitoring. GDMT: Started on Jardiance 10 mg, spironolactone 12.5 mg, Entresto 49-51 mg twice daily.  Start Toprol-XL 25mg .  Repeat labs at follow-up Continue Eliquis 5mg  BID for atleast 3 months per Dr. Eden Emms Need social services support.  But reports having insurance now. Would repeat echocardiogram in 3 months.  AKI Currently 1.38, down trended after transitioning back to p.o.  Valvular disease Moderate MR noted however felt to be underestimated given eccentric jet.  Valve was abnormal.  Moderate TR.  Continue to monitor.  Prior alcohol abuse, nicotine dependence Reported drinking 40oz beers x 3 daily.  Now has quit.  Has follow-up with heart failure TOC.    For questions or updates, please contact [facility] Please consult [email or web address] for contact info under        Signed, Abagail Kitchens, PA-C

## 2023-07-22 ENCOUNTER — Inpatient Hospital Stay (HOSPITAL_COMMUNITY): Payer: 59

## 2023-07-22 ENCOUNTER — Telehealth: Payer: Self-pay | Admitting: Cardiology

## 2023-07-22 DIAGNOSIS — I5041 Acute combined systolic (congestive) and diastolic (congestive) heart failure: Secondary | ICD-10-CM | POA: Diagnosis not present

## 2023-07-22 LAB — BASIC METABOLIC PANEL
Anion gap: 7 (ref 5–15)
BUN: 14 mg/dL (ref 6–20)
CO2: 26 mmol/L (ref 22–32)
Calcium: 8.3 mg/dL — ABNORMAL LOW (ref 8.9–10.3)
Chloride: 105 mmol/L (ref 98–111)
Creatinine, Ser: 1.54 mg/dL — ABNORMAL HIGH (ref 0.61–1.24)
GFR, Estimated: 53 mL/min — ABNORMAL LOW (ref >60)
Glucose, Bld: 95 mg/dL (ref 70–99)
Potassium: 3.6 mmol/L (ref 3.5–5.1)
Sodium: 138 mmol/L (ref 135–145)

## 2023-07-22 LAB — CBC
HCT: 48.7 % (ref 39.0–52.0)
Hemoglobin: 15.1 g/dL (ref 13.0–17.0)
MCH: 27.3 pg (ref 26.0–34.0)
MCHC: 31 g/dL (ref 30.0–36.0)
MCV: 87.9 fL (ref 80.0–100.0)
Platelets: 119 10*3/uL — ABNORMAL LOW (ref 150–400)
RBC: 5.54 MIL/uL (ref 4.22–5.81)
RDW: 17.5 % — ABNORMAL HIGH (ref 11.5–15.5)
WBC: 4 10*3/uL (ref 4.0–10.5)
nRBC: 0 % (ref 0.0–0.2)

## 2023-07-22 LAB — VITAMIN B12: Vitamin B-12: 171 pg/mL — ABNORMAL LOW (ref 180–914)

## 2023-07-22 LAB — HEMOGLOBIN A1C
Hgb A1c MFr Bld: 5.7 % — ABNORMAL HIGH (ref 4.8–5.6)
Mean Plasma Glucose: 116.89 mg/dL

## 2023-07-22 LAB — TSH: TSH: 2.44 u[IU]/mL (ref 0.350–4.500)

## 2023-07-22 LAB — MAGNESIUM: Magnesium: 1.9 mg/dL (ref 1.7–2.4)

## 2023-07-22 LAB — FOLATE: Folate: 8 ng/mL (ref >5.9)

## 2023-07-22 MED ORDER — GABAPENTIN 100 MG PO CAPS
100.0000 mg | ORAL_CAPSULE | Freq: Three times a day (TID) | ORAL | Status: DC
  Administered 2023-07-22 – 2023-07-23 (×4): 100 mg via ORAL
  Filled 2023-07-22 (×4): qty 1

## 2023-07-22 MED ORDER — TRAMADOL HCL 50 MG PO TABS
50.0000 mg | ORAL_TABLET | Freq: Four times a day (QID) | ORAL | Status: AC | PRN
  Administered 2023-07-22 – 2023-07-23 (×2): 50 mg via ORAL
  Filled 2023-07-22 (×2): qty 1

## 2023-07-22 MED ORDER — MAGNESIUM OXIDE -MG SUPPLEMENT 400 (240 MG) MG PO TABS
400.0000 mg | ORAL_TABLET | Freq: Once | ORAL | Status: AC
  Administered 2023-07-22: 400 mg via ORAL
  Filled 2023-07-22: qty 1

## 2023-07-22 MED ORDER — POTASSIUM CHLORIDE CRYS ER 20 MEQ PO TBCR
40.0000 meq | EXTENDED_RELEASE_TABLET | Freq: Once | ORAL | Status: AC
  Administered 2023-07-22: 40 meq via ORAL
  Filled 2023-07-22: qty 2

## 2023-07-22 MED ORDER — DICLOFENAC SODIUM 1 % EX GEL
2.0000 g | Freq: Four times a day (QID) | CUTANEOUS | Status: DC
  Administered 2023-07-22 (×2): 2 g via TOPICAL
  Filled 2023-07-22: qty 100

## 2023-07-22 NOTE — Telephone Encounter (Signed)
   Transition of Care Follow-up Phone Call Request    Patient Name: Hector Manning Date of Birth: 04-Feb-1966 Date of Encounter: 07/22/2023  Primary Care Provider:  Patient, No Pcp Per Primary Cardiologist:  Christell Constant, MD  Hector Manning has been scheduled for a transition of care follow up appointment with a [facility] provider:  Jari Favre. Thursday Aug 27, 2023  Arrive by 9:50 AMAppt at 10:05 AM (25 min)   Patient will also be seen by advanced heart failure TOC clinic on the 27th.  He has been lost in follow-up before.  Please ensure that he makes it to this upcoming follow-up.  Please reach out to Hector Manning within 48 hours of discharge to confirm appointment and review transition of care protocol questionnaire. Anticipated discharge date: today   Ludwig Lean  07/22/2023, 10:43 AM

## 2023-07-22 NOTE — Progress Notes (Signed)
Mobility Specialist - Progress Note   07/22/23 1310  Mobility  Activity Ambulated with assistance in hallway  Level of Assistance Modified independent, requires aide device or extra time  Assistive Device Front wheel walker  Distance Ambulated (ft) 250 ft  Activity Response Tolerated well  Mobility Referral Yes  Mobility visit 1 Mobility  Mobility Specialist Start Time (ACUTE ONLY) 1302  Mobility Specialist Stop Time (ACUTE ONLY) 1310  Mobility Specialist Time Calculation (min) (ACUTE ONLY) 8 min   Pt received in EOB and agreeable to mobility. Further ambulation d/t L foot pain. RN made aware. No other complaints during session. Pt to EOB after session with all needs met.    [facility]

## 2023-07-22 NOTE — Plan of Care (Signed)
  Problem: Education: Goal: Ability to demonstrate management of disease process will improve Outcome: Progressing Goal: Ability to verbalize understanding of medication therapies will improve Outcome: Progressing   Problem: Activity: Goal: Capacity to carry out activities will improve Outcome: Progressing   Problem: Cardiac: Goal: Ability to achieve and maintain adequate cardiopulmonary perfusion will improve Outcome: Progressing   Problem: Education: Goal: Knowledge of General Education information will improve Description: Including pain rating scale, medication(s)/side effects and non-pharmacologic comfort measures Outcome: Progressing   Problem: Health Behavior/Discharge Planning: Goal: Ability to manage health-related needs will improve Outcome: Progressing   Problem: Clinical Measurements: Goal: Ability to maintain clinical measurements within normal limits will improve Outcome: Progressing Goal: Will remain free from infection Outcome: Progressing Goal: Diagnostic test results will improve Outcome: Progressing Goal: Respiratory complications will improve Outcome: Progressing Goal: Cardiovascular complication will be avoided Outcome: Progressing   Problem: Activity: Goal: Risk for activity intolerance will decrease Outcome: Progressing   Problem: Nutrition: Goal: Adequate nutrition will be maintained Outcome: Progressing   Problem: Coping: Goal: Level of anxiety will decrease Outcome: Progressing   Problem: Elimination: Goal: Will not experience complications related to bowel motility Outcome: Progressing Goal: Will not experience complications related to urinary retention Outcome: Progressing   Problem: Pain Managment: Goal: General experience of comfort will improve and/or be controlled Outcome: Progressing   Problem: Safety: Goal: Ability to remain free from injury will improve Outcome: Progressing   Problem: Skin Integrity: Goal: Risk for  impaired skin integrity will decrease Outcome: Progressing

## 2023-07-22 NOTE — Progress Notes (Signed)
PROGRESS NOTE    Hector Manning  QMV:[id number] DOB: 05/07/1966 DOA: 07/18/2023 PCP: Patient, No Pcp Per  Chief Complaint  Patient presents with   Shortness of Breath    Brief Narrative:   58 year old male with hypertension, congestive heart failure, iron deficiency anemia who presented to the hospital with left-sided chest pain. He was noted to be in acute heart failure.  Cardiology is following.  He's noted to have severe HF (EF <20%) and has been diagnosed with ventricular thrombus as well.   See below for additional details.  Assessment & Plan:   Principal Problem:   Acute combined systolic and diastolic congestive heart failure (HCC) Active Problems:   Essential hypertension   CKD (chronic kidney disease) stage 3, GFR 30-59 ml/min (HCC)   Continuous dependence on cigarette smoking   Anemia of chronic disease   Thrombocytopenia (HCC)   Acute exacerbation of CHF (congestive heart failure) (HCC)   Elevated troponin   Acute combined systolic and diastolic congestive heart failure - Chest x-ray at presentation showed vascular congestion and layering bilateral effusions - Appreciate cardiology eval - now on oral lasix, dry weight 215-220 lbs.  Jardiance, spironolactone, entresto, metoprolol.   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  Low 4T score.   - continue eliquis - further workup outpatient   Peripheral Neuropathy - will start gabapentin - follow b12 (has history of known b12 deficiency 03/2022), folate, TSH.  Possibly related to his etoh abuse?   Follow A1c, though doesn't appear c/w diabetes based on his BG's.   History of alcohol and nicotine abuse - States "he quit everything about 3 months ago"   No insurance and no PCP High risk for readmission-needs transitions of care assistance with finding Arris Meyn PCP and getting at least 1 month of medications for free_- have consulted TOC-       DVT prophylaxis: eliquis Code Status: full Family Communication: none Disposition:   Status is: Inpatient Remains inpatient appropriate because: need for continued inpatient care   Consultants:  cardiology  Procedures:  Echo IMPRESSIONS     1. Complex, layered LV apical thrombus with the largest dimension  measuring 2.7x1cm. Left ventricular ejection fraction, by estimation, is  <20%. The left ventricle has severely decreased function. The left  ventricle demonstrates global hypokinesis. The  left ventricular internal cavity size was severely dilated. Left  ventricular diastolic parameters are consistent with Grade III diastolic  dysfunction (restrictive).   2. Right ventricular systolic function is moderately reduced. The right  ventricular size is severely enlarged. There is normal pulmonary artery  systolic pressure. The estimated right ventricular systolic pressure is  34.4 mmHg.   3. Left atrial size was severely dilated.   4. Right atrial size was severely dilated.   5. Large pleural effusion in the left lateral region.   6. Mitral regurgitation related to posterior restriction, suspect degree  is underestimated; eccentric jet on A2c view. The mitral valve is  abnormal. Mild to moderate mitral valve regurgitation.   7. Tricuspid valve regurgitation is moderate.   8. The aortic valve has  an indeterminant number of cusps. Aortic valve  regurgitation is not visualized.   9. The inferior vena cava is dilated in size with <50% respiratory  variability, suggesting right atrial pressure of 15 mmHg.   Comparison(s): Prior images  reviewed side by side. Biventricular disease  has worsened, valve regurgitation has worsened, and left apical appendage  thrombus is new.   Conclusion(s)/Recommendation(s): Will reach out to primary team.   Antimicrobials:  Anti-infectives (From admission, onward)    None       Subjective: C/o burning to LE's  Objective: Vitals:   07/21/23 0900 07/21/23 1125 07/22/23 0332 07/22/23 0500  BP: 120/81 113/77 109/77   Pulse: 82 92 93   Resp: 18 19 19    Temp: 98.3 F (36.8 C) 98.2 F (36.8 C) 98.3 F (36.8 C)   TempSrc: Oral Oral Oral   SpO2: 98% 97% 98%   Weight: 94.7 kg   94.5 kg  Height:        Intake/Output Summary (Last 24 hours) at 07/22/2023 0923 Last data filed at 07/22/2023 0759 Gross per 24 hour  Intake 834 ml  Output 2275 ml  Net -1441 ml   Filed Weights   07/21/23 0500 07/21/23 0900 07/22/23 0500  Weight: 100 kg 94.7 kg 94.5 kg    Examination:  General exam: scratching his legs, uncomfortable with leg discomfort Respiratory system: Clear to auscultation. Respiratory effort normal. Cardiovascular system: RRR Central nervous system: Alert and oriented. No focal neurological deficits. Extremities: no LEE, palpable DP pulses, 1+ bilaterally    Data Reviewed: I have personally reviewed following labs and imaging studies  CBC: Recent Labs  Lab 07/18/23 0100 07/18/23 0449 07/21/23 0503 07/22/23 0500  WBC 3.8* 4.1 4.0 4.0  HGB 14.8 14.1 15.4 15.1  HCT 49.3 46.2 49.7 48.7  MCV 91.3 89.5 86.9 87.9  PLT 129* 131* 106* 119*    Basic Metabolic Panel: Recent Labs  Lab 07/18/23 0100 07/18/23 0449 07/19/23 0533 07/21/23 0503  NA 139 137 134* 138  K 4.2 4.0 3.8 3.9  CL 107 106 107 106  CO2 22 22 20* 25  GLUCOSE 91 87 78 81  BUN 13 13 16 15   CREATININE 1.18 1.36* 1.67* 1.38*  CALCIUM 8.9 8.7* 8.3* 8.3*    GFR: Estimated Creatinine Clearance: 71 mL/min (Fotini Lemus) (by C-G formula based on SCr of 1.38 mg/dL (H)).  Liver Function Tests: Recent Labs   Lab 07/18/23 0449  AST 24  ALT 15  ALKPHOS 64  BILITOT 2.0*  PROT 7.3  ALBUMIN 3.1*    CBG: No results for input(s): "GLUCAP" in the last 168 hours.   Recent Results (from the past 240 hours)  Resp panel by RT-PCR (RSV, Flu Georgene Kopper&B, Covid) Anterior Nasal Swab     Status: None   Collection Time: 07/18/23 12:50 AM   Specimen: Anterior Nasal Swab  Result Value Ref Range Status   SARS Coronavirus 2 by RT PCR NEGATIVE NEGATIVE Final    Comment: (NOTE) SARS-CoV-2 target nucleic acids are NOT DETECTED.  The SARS-CoV-2 RNA is generally detectable in upper respiratory specimens during the acute phase of infection. The lowest concentration of SARS-CoV-2 viral copies this assay can detect is 138 copies/mL. Jveon Pound negative result does not preclude SARS-Cov-2 infection and should not be used as the sole basis for treatment or other patient management decisions. Camella Seim negative result may occur with  improper specimen collection/handling, submission of specimen other than nasopharyngeal swab, presence of viral mutation(s) within the areas targeted by this  assay, and inadequate number of viral copies(<138 copies/mL). Peri Kreft negative result must be combined with clinical observations, patient history, and epidemiological information. The expected result is Negative.  Fact Sheet for Patients:  BloggerCourse.com  Fact Sheet for Healthcare Providers:  SeriousBroker.it  This test is no t yet approved or cleared by the [place] FDA and  has been authorized for detection and/or diagnosis of SARS-CoV-2 by FDA under an Emergency Use Authorization (EUA). This EUA will remain  in effect (meaning this test can be used) for the duration of the COVID-19 declaration under Section 564(b)(1) of the Act, 21 U.S.C.section 360bbb-3(b)(1), unless the authorization is terminated  or revoked sooner.       Influenza Milanni Ayub by PCR NEGATIVE NEGATIVE Final   Influenza B by  PCR NEGATIVE NEGATIVE Final    Comment: (NOTE) The Xpert Xpress SARS-CoV-2/FLU/RSV plus assay is intended as an aid in the diagnosis of influenza from Nasopharyngeal swab specimens and should not be used as Beronica Lansdale sole basis for treatment. Nasal washings and aspirates are unacceptable for Xpert Xpress SARS-CoV-2/FLU/RSV testing.  Fact Sheet for Patients: BloggerCourse.com  Fact Sheet for Healthcare Providers: SeriousBroker.it  This test is not yet approved or cleared by the [place] FDA and has been authorized for detection and/or diagnosis of SARS-CoV-2 by FDA under an Emergency Use Authorization (EUA). This EUA will remain in effect (meaning this test can be used) for the duration of the COVID-19 declaration under Section 564(b)(1) of the Act, 21 U.S.C. section 360bbb-3(b)(1), unless the authorization is terminated or revoked.     Resp Syncytial Virus by PCR NEGATIVE NEGATIVE Final    Comment: (NOTE) Fact Sheet for Patients: BloggerCourse.com  Fact Sheet for Healthcare Providers: SeriousBroker.it  This test is not yet approved or cleared by the [place] FDA and has been authorized for detection and/or diagnosis of SARS-CoV-2 by FDA under an Emergency Use Authorization (EUA). This EUA will remain in effect (meaning this test can be used) for the duration of the COVID-19 declaration under Section 564(b)(1) of the Act, 21 U.S.C. section 360bbb-3(b)(1), unless the authorization is terminated or revoked.  Performed at [facility], [address] [place], [place] [address]   Respiratory (~20 pathogens) panel by PCR     Status: None   Collection Time: 07/18/23 12:50 AM   Specimen: Nasopharyngeal Swab; Respiratory  Result Value Ref Range Status   Adenovirus NOT DETECTED NOT DETECTED Final   Coronavirus 229E NOT DETECTED NOT DETECTED Final    Comment:  (NOTE) The Coronavirus on the Respiratory Panel, DOES NOT test for the novel  Coronavirus (2019 nCoV)    Coronavirus HKU1 NOT DETECTED NOT DETECTED Final   Coronavirus NL63 NOT DETECTED NOT DETECTED Final   Coronavirus OC43 NOT DETECTED NOT DETECTED Final   Metapneumovirus NOT DETECTED NOT DETECTED Final   Rhinovirus / Enterovirus NOT DETECTED NOT DETECTED Final   Influenza Jamiee Milholland NOT DETECTED NOT DETECTED Final   Influenza B NOT DETECTED NOT DETECTED Final   Parainfluenza Virus 1 NOT DETECTED NOT DETECTED Final   Parainfluenza Virus 2 NOT DETECTED NOT DETECTED Final   Parainfluenza Virus 3 NOT DETECTED NOT DETECTED Final   Parainfluenza Virus 4 NOT DETECTED NOT DETECTED Final   Respiratory Syncytial Virus NOT DETECTED NOT DETECTED Final   Bordetella pertussis NOT DETECTED NOT DETECTED Final   Bordetella Parapertussis NOT DETECTED NOT DETECTED Final   Chlamydophila pneumoniae NOT DETECTED NOT DETECTED Final   Mycoplasma pneumoniae NOT DETECTED NOT DETECTED Final  Comment: Performed at [facility], [address] [place], [place] [address]         Radiology Studies: No results found.      Scheduled Meds:  apixaban  5 mg Oral BID   atorvastatin  40 mg Oral Daily   empagliflozin  10 mg Oral QAC breakfast   ferrous sulfate  325 mg Oral Q breakfast   furosemide  40 mg Oral Daily   gabapentin  100 mg Oral TID   hydrocerin   Topical BID   metoprolol succinate  25 mg Oral Daily   sacubitril-valsartan  1 tablet Oral BID   sodium chloride flush  3 mL Intravenous Q12H   sodium chloride flush  3 mL Intravenous Q12H   spironolactone  12.5 mg Oral Daily   Continuous Infusions:   LOS: 4 days    Time spent: over 30 min    Lacretia Nicks, MD [facility]   To contact the attending provider between 7A-7P or the covering provider during after hours 7P-7A, please log into the web site [email or web address] and access using universal [place] password for that web site.  If you do not have the password, please call the hospital operator.  07/22/2023, 9:23 AM

## 2023-07-22 NOTE — Progress Notes (Addendum)
Patient Name: Hector Manning Date of Encounter: 07/22/2023 [facility] Cardiologist: Christell Constant, MD   Interval Summary  .    No changes, continues to diurese well.  Has some itchiness and pain in his lower legs.  Likely dryness.  Vitals:   07/21/23 0900 07/21/23 1125 07/22/23 0332 07/22/23 0500  BP: 120/81 113/77 109/77   Pulse: 82 92 93   Resp: 18 19 19    Temp: 98.3 F (36.8 C) 98.2 F (36.8 C) 98.3 F (36.8 C)   TempSrc: Oral Oral Oral   SpO2: 98% 97% 98%   Weight: 94.7 kg   94.5 kg  Height:        Intake/Output Summary (Last 24 hours) at 07/22/2023 0949 Last data filed at 07/22/2023 0940 Gross per 24 hour  Intake 834 ml  Output 2475 ml  Net -1641 ml      07/22/2023    5:00 AM 07/21/2023    9:00 AM 07/21/2023    5:00 AM  Last 3 Weights  Weight (lbs) 208 lb 5.4 oz 208 lb 12.4 oz 220 lb 7.4 oz  Weight (kg) 94.5 kg 94.7 kg 100 kg      Telemetry/ECG    Sinus rhythm heart rates in the 80s- Personally Reviewed  CV Studies    Echocardiogram 07/18/2023 1. Complex, layered LV apical thrombus with the largest dimension  measuring 2.7x1cm. Left ventricular ejection fraction, by estimation, is  <20%. The left ventricle has severely decreased function. The left  ventricle demonstrates global hypokinesis. The  left ventricular internal cavity size was severely dilated. Left  ventricular diastolic parameters are consistent with Grade III diastolic  dysfunction (restrictive).   2. Right ventricular systolic function is moderately reduced. The right  ventricular size is severely enlarged. There is normal pulmonary artery  systolic pressure. The estimated right ventricular systolic pressure is  34.4 mmHg.   3. Left atrial size was severely dilated.   4. Right atrial size was severely dilated.   5. Large pleural effusion in the left lateral region.   6. Mitral regurgitation related to posterior restriction, suspect degree  is underestimated; eccentric  jet on A2c view. The mitral valve is  abnormal. Mild to moderate mitral valve regurgitation.   7. Tricuspid valve regurgitation is moderate.   8. The aortic valve has an indeterminant number of cusps. Aortic valve  regurgitation is not visualized.   9. The inferior vena cava is dilated in size with <50% respiratory  variability, suggesting right atrial pressure of 15 mmHg.   Right left heart catheterization 04/29/2022 Normal right heart hemodynamics with low right and left heart diastolic filling pressures, mean PA pressure of 17 mmHg, LVEDP 8 mmHg, wedge pressure of 5 mmHg. 2.  Widely patent coronary arteries with no obstructive CAD   Recommendations: Medical therapy for nonischemic cardiomyopathy/heart failure with reduced ejection fraction    Physical Exam .   GEN: No acute distress.   Neck: mild JVD with HJR Cardiac: RRR, no murmurs, rubs, or gallops.  Respiratory: minimal crackles GI: Soft, nontender, non-distended  MS: No edema  Patient Profile    Hector Manning is a 58 y.o. male has hx of nonischemic cardiomyopathy with reduced EF, previous alcohol and nicotine abuse (quit 3 months ago, hypertension.  Admitted for CHF exacerbation  Assessment & Plan .     Nonischemic cardiomyopathy with severely reduced EF  New layered LV apical thrombus 2.7 x 1cm Suspect related to prior alcohol abuse.  Reports no prior  drug use.  03/2022 heart cath showed low right and left diastolic filling pressures with widely patent coronary anatomy.  In the past year he was lost to follow-up and noncompliant with medications but now has insurance.  EF here shows progressive decline less than 20% with moderately reduced RV function.  Normal PASP.  Grade 3 diastolic dysfunction.   No changes from yesterday.  Looks euvolemic, still diuresing well.  -2.4 L in last 24 hours.  No labs today yet. Has follow-up with heart failure TOC.  Also will follow-up with general cardiology 1 month after his TOC visit.  TOC  phone call initiated.  Diuresed well on IV Lasix 40 BID.  Transitioned to p.o. Lasix 40 mg daily. Instructions to take extra dose for increased weight, shortness of breath, swelling. Dry weight around 208-215.  We discussed ongoing management of CHF including BP weight monitoring. GDMT: Started on Jardiance 10 mg, spironolactone 12.5 mg, Entresto 49-51 mg twice daily, Toprol-XL 25mg .  Repeat labs at follow-up, tolerating GDMT well. Continue Eliquis 5mg  BID for atleast 3 months per Dr. Eden Emms Need social services support.  But reports having insurance now. Would repeat echocardiogram in 3 months, may consider CRT if EF not improved.   AKI Currently 1.38, down trended after transitioning back to p.o.  Valvular disease Moderate MR noted however felt to be underestimated given eccentric jet.  Valve was abnormal.  Moderate TR.  Continue to monitor.  Prior alcohol abuse, nicotine dependence Reported drinking 40oz beers x 3 daily.  Now has quit.    For questions or updates, please contact [facility] Please consult [email or web address] for contact info under        Signed, Abagail Kitchens, PA-C

## 2023-07-22 NOTE — Plan of Care (Signed)

## 2023-07-23 ENCOUNTER — Other Ambulatory Visit (HOSPITAL_COMMUNITY): Payer: Self-pay

## 2023-07-23 DIAGNOSIS — I5041 Acute combined systolic (congestive) and diastolic (congestive) heart failure: Secondary | ICD-10-CM | POA: Diagnosis not present

## 2023-07-23 MED ORDER — SPIRONOLACTONE 25 MG PO TABS
12.5000 mg | ORAL_TABLET | Freq: Every day | ORAL | 1 refills | Status: DC
  Filled 2023-07-23: qty 15, 30d supply, fill #0

## 2023-07-23 MED ORDER — FOLIC ACID 1 MG PO TABS
1.0000 mg | ORAL_TABLET | Freq: Every day | ORAL | 1 refills | Status: AC
  Filled 2023-07-23: qty 30, 30d supply, fill #0

## 2023-07-23 MED ORDER — FUROSEMIDE 40 MG PO TABS
40.0000 mg | ORAL_TABLET | Freq: Every day | ORAL | 1 refills | Status: DC
  Filled 2023-07-23: qty 30, 30d supply, fill #0

## 2023-07-23 MED ORDER — SACUBITRIL-VALSARTAN 49-51 MG PO TABS
1.0000 | ORAL_TABLET | Freq: Two times a day (BID) | ORAL | 1 refills | Status: DC
  Filled 2023-07-23: qty 60, 30d supply, fill #0

## 2023-07-23 MED ORDER — VITAMIN B-12 1000 MCG PO TABS
1000.0000 ug | ORAL_TABLET | Freq: Every day | ORAL | 1 refills | Status: AC
  Filled 2023-07-23: qty 30, 30d supply, fill #0

## 2023-07-23 MED ORDER — GABAPENTIN 100 MG PO CAPS
100.0000 mg | ORAL_CAPSULE | Freq: Three times a day (TID) | ORAL | 1 refills | Status: DC
  Filled 2023-07-23: qty 90, 30d supply, fill #0

## 2023-07-23 MED ORDER — CYANOCOBALAMIN 1000 MCG/ML IJ SOLN
1000.0000 ug | Freq: Every day | INTRAMUSCULAR | Status: DC
  Administered 2023-07-23: 1000 ug via INTRAMUSCULAR
  Filled 2023-07-23: qty 1

## 2023-07-23 MED ORDER — FERROUS SULFATE 325 (65 FE) MG PO TABS
325.0000 mg | ORAL_TABLET | ORAL | 1 refills | Status: DC
  Filled 2023-07-23: qty 15, 30d supply, fill #0

## 2023-07-23 MED ORDER — METOPROLOL SUCCINATE ER 25 MG PO TB24
25.0000 mg | ORAL_TABLET | Freq: Every day | ORAL | 1 refills | Status: DC
  Filled 2023-07-23: qty 30, 30d supply, fill #0

## 2023-07-23 MED ORDER — ATORVASTATIN CALCIUM 40 MG PO TABS
40.0000 mg | ORAL_TABLET | Freq: Every day | ORAL | 1 refills | Status: DC
  Filled 2023-07-23: qty 30, 30d supply, fill #0

## 2023-07-23 MED ORDER — FOLIC ACID 1 MG PO TABS
1.0000 mg | ORAL_TABLET | Freq: Every day | ORAL | Status: DC
  Administered 2023-07-23: 1 mg via ORAL
  Filled 2023-07-23: qty 1

## 2023-07-23 MED ORDER — APIXABAN 5 MG PO TABS
5.0000 mg | ORAL_TABLET | Freq: Two times a day (BID) | ORAL | 1 refills | Status: DC
  Filled 2023-07-23: qty 60, 30d supply, fill #0

## 2023-07-23 MED ORDER — EMPAGLIFLOZIN 10 MG PO TABS
10.0000 mg | ORAL_TABLET | Freq: Every day | ORAL | 1 refills | Status: DC
  Filled 2023-07-23: qty 30, 30d supply, fill #0

## 2023-07-23 NOTE — Progress Notes (Signed)
AVS reviewed w/ pt -- questions answered about new meds and follow up appointments. Pt's ride will be here at 1530. Pt dressing for d/c to home.

## 2023-07-23 NOTE — Progress Notes (Signed)
TOC discharge meds in  a secure bag delivered to pt in room. Pt trying to get a ride home. Pt's Jardiance was not filled  due to cost. Dr Lowell Guitar was updated via secure chat by [facility] pharmacy.

## 2023-07-23 NOTE — Discharge Summary (Addendum)
Physician Discharge Summary  Hector Manning UXL:[id number] DOB: Sep 03, 1965 DOA: 07/18/2023  PCP: Patient, No Pcp Per  Admit date: 07/18/2023 Discharge date: 07/23/2023  Time spent: 40 minutes  Recommendations for Outpatient Follow-up:  Follow outpatient CBC/CMP  Follow with cards outpatient Follow repeat CXR outpatient Ensure gets medication refills  Follow b12 deficiency outpatient   SGLT2 inhibitor prohibitively expensive, follow with cards regarding programs to help with cost  Discharge Diagnoses:  Principal Problem:   Acute combined systolic and diastolic congestive heart failure (HCC) Active Problems:   Essential hypertension   CKD (chronic kidney disease) stage 3, GFR 30-59 ml/min (HCC)   Continuous dependence on cigarette smoking   Anemia of chronic disease   Thrombocytopenia (HCC)   Acute exacerbation of CHF (congestive heart failure) (HCC)   Elevated troponin   Discharge Condition: stable  Diet recommendation: heart healthy  Filed Weights   07/21/23 0900 07/22/23 0500 07/23/23 0443  Weight: 94.7 kg 94.5 kg 93.4 kg    History of present illness:   58 year old male with hypertension, congestive heart failure, iron deficiency anemia who presented to the hospital with left-sided chest pain. He was noted to be in acute heart failure.  Cardiology is following.  He's noted to have severe HF (EF <20%) and has been diagnosed with ventricular thrombus as well.    Stable for discharge 1/23.    See below for additional details. Hospital Course:  Assessment and Plan:   Acute combined systolic and diastolic congestive heart failure - Chest x-ray at presentation showed vascular congestion and layering bilateral effusions - Appreciate cardiology eval - now on oral lasix, dry weight 215-220 lbs.  spironolactone, entresto, metoprolol.  SGLT2 inhibitor prohibitively expensive, will need to follow with [facility] outpatient regarding this.  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  Low 4T score.   - continue eliquis - further workup outpatient as needed   Peripheral Neuropathy B12 deficiency - will start gabapentin - follow b12 (low) - discharge with B12 supplementation.  Normal folate, TSH, and A1c.  Possibly related to his etoh abuse as well?  - discharge with b12, folate   History of alcohol and nicotine abuse - States "he quit everything about 3 months ago"   Appreciate TOC, arranged PCP follow up    Procedures: Echo Echo IMPRESSIONS     1. Complex, layered LV apical thrombus with the largest dimension  measuring 2.7x1cm. Left ventricular ejection fraction, by estimation, is  <20%. The left ventricle has severely decreased function. The left  ventricle demonstrates global hypokinesis. The  left ventricular internal cavity size was severely dilated. Left  ventricular diastolic parameters are consistent with Grade III diastolic  dysfunction (restrictive).   2. Right ventricular systolic function is moderately reduced. The right  ventricular size is severely enlarged. There is normal pulmonary artery  systolic pressure. The estimated right ventricular systolic pressure is  34.4 mmHg.   3. Left atrial size was severely dilated.   4. Right atrial size was severely dilated.   5. Large pleural effusion in  the left lateral region.   6. Mitral regurgitation related to posterior restriction, suspect degree  is underestimated; eccentric jet on A2c view. The mitral valve is  abnormal.  Mild to moderate mitral valve regurgitation.   7. Tricuspid valve regurgitation is moderate.   8. The aortic valve has an indeterminant number of cusps. Aortic valve  regurgitation is not visualized.   9. The inferior vena cava is dilated in size with <50% respiratory  variability, suggesting right atrial pressure of 15 mmHg.   Comparison(s): Prior images reviewed side by side. Biventricular disease  has worsened, valve regurgitation has worsened, and left apical appendage  thrombus is new.   Conclusion(s)/Recommendation(s): Will reach out to primary team.    Consultations: cardiology  Discharge Exam: Vitals:   07/23/23 0443 07/23/23 1102  BP: 116/83 109/83  Pulse: 91 84  Resp: 18 18  Temp: 98.4 F (36.9 C) (!) 97.4 F (36.3 C)  SpO2: 95% 93%   Persistent pain in LE's Ok with discharge today  General: No acute distress. Cardiovascular: RRR Lungs: CTAB, unlabored Neurological: Alert and oriented 3. Moves all extremities 4 with equal strength. Cranial nerves II through XII grossly intact. Extremities: No clubbing or cyanosis. No edema.   Discharge Instructions   Discharge Instructions     Avoid straining   Complete by: As directed    Call MD for:  difficulty breathing, headache or visual disturbances   Complete by: As directed    Call MD for:  extreme fatigue   Complete by: As directed    Call MD for:  hives   Complete by: As directed    Call MD for:  persistant dizziness or light-headedness   Complete by: As directed    Call MD for:  persistant nausea and vomiting   Complete by: As directed    Call MD for:  redness, tenderness, or signs of infection (pain, swelling, redness, odor or green/yellow discharge around incision site)   Complete by: As directed    Call MD for:  severe uncontrolled pain   Complete by: As directed    Call MD for:  temperature >100.4   Complete by: As directed    Diet - low sodium heart healthy   Complete by: As directed    Diet -  low sodium heart healthy   Complete by: As directed    Discharge instructions   Complete by: As directed    You were seen for Hector Manning heart failure exacerbation.  You've been started on medicines for your heart failure.  Continue the metoprolol, entresto, lasix, and spironolactone.  We tried to get you jardiance, but this was prohibitively expensive.  You'll need to follow up with cardiology regarding patient assistance programs.   You had Michele Kerlin clot (thrombus) seen on echocardiogram.  We've started you on eliquis to treat this.    You'll need to follow up with cardiology as an outpatient for refills and for medication adjustment.   You have b12 deficiency.  I think this is causing your neuropathy symptoms (the pain in your legs).  We gave you Barnie Sopko B12 injection, continue B12 pills at discharge.  You need to follow up with your PCP to discuss follow up.    Return for new, recurrent, or worsening symptoms.  Please ask your PCP to request records from this hospitalization so they know what was done and what the next steps will be.   Heart Failure patients record your daily weight using the same scale at the same time  of day   Complete by: As directed    Increase activity slowly   Complete by: As directed    Increase activity slowly   Complete by: As directed    STOP any activity that causes chest pain, shortness of breath, dizziness, sweating, or exessive weakness   Complete by: As directed       Allergies as of 07/23/2023   No Known Allergies      Medication List     STOP taking these medications    carvedilol 12.5 MG tablet Commonly known as: COREG   Jardiance 10 MG Tabs tablet Generic drug: empagliflozin   losartan 50 MG tablet Commonly known as: COZAAR       TAKE these medications    acetaminophen 500 MG tablet Commonly known as: TYLENOL Take 1,000 mg by mouth every 4 (four) hours as needed for mild pain (pain score 1-3).   Alka-Seltzer Pls Sinus & Cough 10-5-325 MG  Caps Generic drug: DM-Phenylephrine-Acetaminophen Take 2 capsules by mouth every 6 (six) hours as needed (cough / cold).   atorvastatin 40 MG tablet Commonly known as: LIPITOR Take 1 tablet (40 mg total) by mouth daily. Start taking on: July 24, 2023   B-12 1000 MCG Tabs Take 1 tablet (1,000 mcg total) by mouth daily.   Eliquis 5 MG Tabs tablet Generic drug: apixaban Take 1 tablet (5 mg total) by mouth 2 (two) times daily. Please deliver to bedside   Entresto 49-51 MG Generic drug: sacubitril-valsartan Take 1 tablet by mouth 2 (two) times daily.   FeroSul 325 (65 Fe) MG tablet Generic drug: ferrous sulfate Take 1 tablet (325 mg total) by mouth every other day. What changed: when to take this   folic acid 1 MG tablet Commonly known as: FOLVITE Take 1 tablet (1 mg total) by mouth daily. Start taking on: July 24, 2023   furosemide 40 MG tablet Commonly known as: LASIX Take 1 tablet (40 mg total) by mouth daily. Start taking on: July 24, 2023 What changed: when to take this   gabapentin 100 MG capsule Commonly known as: NEURONTIN Take 1 capsule (100 mg total) by mouth 3 (three) times daily.   metoprolol succinate 25 MG 24 hr tablet Commonly known as: TOPROL-XL Take 1 tablet (25 mg total) by mouth daily. Take with or immediately following Colletta Spillers meal. Start taking on: July 24, 2023   spironolactone 25 MG tablet Commonly known as: ALDACTONE Take 0.5 tablets (12.5 mg total) by mouth daily. Start taking on: July 24, 2023   Visine 0.025-0.3 % ophthalmic solution Generic drug: naphazoline-pheniramine Place 2 drops into both eyes 2 (two) times daily as needed for eye irritation.       No Known Allergies  Follow-up Information     [facility]. Go in 4 day(s).   Specialty: Cardiology Why: Hospital follow up 07/27/2023 @ 3 pm PLEASE bring Tamecka Milham current medication list to appointment FREE valet parking, Entrance C, off  [employer] information: [address] [place] [address] [phone]        [facility] Follow up.   Specialty: Internal Medicine Why: Appointment: Friday, August 14, 2023 at 3:20pm Please arrive 15 mins early, bring list of medications, ID and insurance card   **if you are unable to keep your appointment , please call as least 24 hrs in advance to cancel or reschedule** Contact information: [address] [place] [address]  [phone]        Sharlene Dory, PA-C Follow up.   Specialty: Cardiology Why: Thursday Aug 27, 2023 Arrive by 9:50 AMAppt at 10:05 AM (25 min) Contact information: [address] [place] [address] [phone]                  The results of significant diagnostics from this hospitalization (including imaging, microbiology, ancillary and laboratory) are listed below for reference.    Significant Diagnostic Studies: DG Chest 2 View Result Date: 07/22/2023 CLINICAL DATA:  Shortness of breath, pleural effusions. EXAM: CHEST - 2 VIEW COMPARISON:  07/18/2023 FINDINGS: Lungs are somewhat hypoinflated demonstrate persistent small bilateral pleural effusions with likely associated bibasilar atelectasis. These findings are unchanged to slightly worse. Mild stable cardiomegaly. Remainder of the exam is unchanged. IMPRESSION: Persistent small bilateral pleural effusions with likely associated bibasilar atelectasis. Electronically Signed   By: Elberta Fortis M.D.   On: 07/22/2023 14:25   ECHOCARDIOGRAM COMPLETE Result Date: 07/18/2023    ECHOCARDIOGRAM REPORT   Patient Name:   Hector Manning Date of Exam: 07/18/2023 Medical Rec #:  [id number]     Height:       71.0 in Accession #:    [id number]    Weight:       255.0 lb Date of Birth:  [birth year]      BSA:          2.338 m Patient Age:    57 years      BP:           122/82 mmHg Patient Gender: M             HR:           88  bpm. Exam Location:  Inpatient Procedure: 2D Echo, Color Doppler and Cardiac Doppler Indications:    I50.21 Acute systolic (congestive) heart failure; I50.31 Acute                 diastolic (congestive) heart failure  History:        Patient has prior history of Echocardiogram examinations, most                 recent 04/25/2022. CHF; Risk Factors:Hypertension.  Sonographer:    Irving Burton Senior RDCS Referring Phys: [phone] SUBRINA SUNDIL IMPRESSIONS  1. Complex, layered LV apical thrombus with the largest dimension measuring 2.7x1cm. Left ventricular ejection fraction, by estimation, is <20%. The left ventricle has severely decreased function. The left ventricle demonstrates global hypokinesis. The left ventricular internal cavity size was severely dilated. Left ventricular diastolic parameters are consistent with Grade III diastolic dysfunction (restrictive).  2. Right ventricular systolic function is moderately reduced. The right ventricular size is severely enlarged. There is normal pulmonary artery systolic pressure. The estimated right ventricular systolic pressure is 34.4 mmHg.  3. Left atrial size was severely dilated.  4. Right atrial size was severely dilated.  5. Large pleural effusion in the left lateral region.  6. Mitral regurgitation related to posterior restriction, suspect degree is underestimated; eccentric jet on A2c view. The mitral valve is abnormal. Mild to moderate mitral valve regurgitation.  7. Tricuspid valve regurgitation is moderate.  8. The aortic valve has an indeterminant number of cusps. Aortic valve regurgitation is not visualized.  9. The inferior vena cava is dilated in size with <50% respiratory variability, suggesting right atrial pressure of 15 mmHg. Comparison(s): Prior images reviewed side by side. Biventricular disease has worsened, valve regurgitation has worsened,  and left apical appendage thrombus is new. Conclusion(s)/Recommendation(s): Will reach out to primary team. FINDINGS   Left Ventricle: Complex, layered LV apical thrombus with the largest dimension measuring 2.7x1cm. Left ventricular ejection fraction, by estimation, is <20%. The left ventricle has severely decreased function. The left ventricle demonstrates global hypokinesis. The left ventricular internal cavity size was severely dilated. There is no left ventricular hypertrophy. Left ventricular diastolic parameters are consistent with Grade III diastolic dysfunction (restrictive). Right Ventricle: The right ventricular size is severely enlarged. No increase in right ventricular wall thickness. Right ventricular systolic function is moderately reduced. There is normal pulmonary artery systolic pressure. The tricuspid regurgitant velocity is 2.20 m/s, and with an assumed right atrial pressure of 15 mmHg, the estimated right ventricular systolic pressure is 34.4 mmHg. Left Atrium: Left atrial size was severely dilated. Right Atrium: Right atrial size was severely dilated. Pericardium: Trivial pericardial effusion is present. The pericardial effusion is posterior to the left ventricle. Mitral Valve: Mitral regurgitation related to posterior restriction, suspect degree is underestimated; eccentric jet on A2c view. The mitral valve is abnormal. Mild to moderate mitral valve regurgitation. Tricuspid Valve: The tricuspid valve is normal in structure. Tricuspid valve regurgitation is moderate. Aortic Valve: The aortic valve has an indeterminant number of cusps. Aortic valve regurgitation is not visualized. Pulmonic Valve: The pulmonic valve was normal in structure. Pulmonic valve regurgitation is trivial. No evidence of pulmonic stenosis. Aorta: The aortic root and ascending aorta are structurally normal, with no evidence of dilitation. Venous: The inferior vena cava is dilated in size with less than 50% respiratory variability, suggesting right atrial pressure of 15 mmHg. IAS/Shunts: No atrial level shunt detected by color flow  Doppler. Additional Comments: There is Rether Rison large pleural effusion in the left lateral region.  LEFT VENTRICLE PLAX 2D LVIDd:         6.20 cm      Diastology LVIDs:         5.40 cm      LV e' medial:    2.75 cm/s LV PW:         0.80 cm      LV E/e' medial:  22.0 LV IVS:        0.70 cm      LV e' lateral:   6.00 cm/s LVOT diam:     2.10 cm      LV E/e' lateral: 10.1 LV SV:         38 LV SV Index:   16 LVOT Area:     3.46 cm  LV Volumes (MOD) LV vol d, MOD A2C: 280.0 ml LV vol d, MOD A4C: 194.0 ml LV vol s, MOD A2C: 237.0 ml LV vol s, MOD A4C: 159.0 ml LV SV MOD A2C:     43.0 ml LV SV MOD A4C:     194.0 ml LV SV MOD BP:      37.7 ml RIGHT VENTRICLE RV S prime:     7.01 cm/s TAPSE (M-mode): 1.9 cm LEFT ATRIUM              Index        RIGHT ATRIUM           Index LA diam:        4.70 cm  2.01 cm/m   RA Area:     42.30 cm LA Vol (A2C):   127.0 ml 54.32 ml/m  RA Volume:   195.00 ml 83.41 ml/m LA Vol (A4C):   91.0 ml  38.92 ml/m LA Biplane Vol: 108.0 ml 46.20 ml/m  AORTIC VALVE LVOT Vmax:   72.50 cm/s LVOT Vmean:  56.800 cm/s LVOT VTI:    0.110 m  AORTA Ao Root diam: 3.10 cm Ao Asc diam:  3.60 cm MITRAL VALVE                  TRICUSPID VALVE MV Area (PHT): 2.54 cm       TR Peak grad:   19.4 mmHg MV Decel Time: 299 msec       TR Vmax:        220.00 cm/s MR Peak grad:    71.6 mmHg MR Mean grad:    48.0 mmHg    SHUNTS MR Vmax:         423.00 cm/s  Systemic VTI:  0.11 m MR Vmean:        329.0 cm/s   Systemic Diam: 2.10 cm MR PISA:         3.53 cm MR PISA Eff ROA: 32 mm MR PISA Radius:  0.75 cm MV E velocity: 60.40 cm/s MV Jaretzy Lhommedieu velocity: 30.00 cm/s MV E/Kaelah Hayashi ratio:  2.01 Riley Lam MD Electronically signed by Riley Lam MD Signature Date/Time: 07/18/2023/9:44:35 AM    Final    DG Chest Port 1 View Result Date: 07/18/2023 CLINICAL DATA:  COPD, shortness of breath EXAM: PORTABLE CHEST 1 VIEW COMPARISON:  05/30/2023 FINDINGS: Cardiomegaly, vascular congestion. Layering bilateral effusions with bilateral  lower lobe airspace opacities. No overt edema. No acute bony abnormality. IMPRESSION: Cardiomegaly, vascular congestion. Layering bilateral effusions with bibasilar atelectasis or infiltrates. Electronically Signed   By: Charlett Nose M.D.   On: 07/18/2023 01:24    Microbiology: Recent Results (from the past 240 hours)  Resp panel by RT-PCR (RSV, Flu Kahlel Peake&B, Covid) Anterior Nasal Swab     Status: None   Collection Time: 07/18/23 12:50 AM   Specimen: Anterior Nasal Swab  Result Value Ref Range Status   SARS Coronavirus 2 by RT PCR NEGATIVE NEGATIVE Final    Comment: (NOTE) SARS-CoV-2 target nucleic acids are NOT DETECTED.  The SARS-CoV-2 RNA is generally detectable in upper respiratory specimens during the acute phase of infection. The lowest concentration of SARS-CoV-2 viral copies this assay can detect is 138 copies/mL. Sanvi Ehler negative result does not preclude SARS-Cov-2 infection and should not be used as the sole basis for treatment or other patient management decisions. Yannet Rincon negative result may occur with  improper specimen collection/handling, submission of specimen other than nasopharyngeal swab, presence of viral mutation(s) within the areas targeted by this assay, and inadequate number of viral copies(<138 copies/mL). Kyliee Ortego negative result must be combined with clinical observations, patient history, and epidemiological information. The expected result is Negative.  Fact Sheet for Patients:  BloggerCourse.com  Fact Sheet for Healthcare Providers:  SeriousBroker.it  This test is no t yet approved or cleared by the [place] FDA and  has been authorized for detection and/or diagnosis of SARS-CoV-2 by FDA under an Emergency Use Authorization (EUA). This EUA will remain  in effect (meaning this test can be used) for the duration of the COVID-19 declaration under Section 564(b)(1) of the Act, 21 U.S.C.section 360bbb-3(b)(1), unless the  authorization is terminated  or revoked sooner.       Influenza Ladale Sherburn by PCR NEGATIVE NEGATIVE Final   Influenza B by PCR NEGATIVE NEGATIVE Final    Comment: (NOTE) The Xpert Xpress SARS-CoV-2/FLU/RSV plus assay is intended as an aid in the diagnosis of influenza from  Nasopharyngeal swab specimens and should not be used as Theophile Harvie sole basis for treatment. Nasal washings and aspirates are unacceptable for Xpert Xpress SARS-CoV-2/FLU/RSV testing.  Fact Sheet for Patients: BloggerCourse.com  Fact Sheet for Healthcare Providers: SeriousBroker.it  This test is not yet approved or cleared by the [place] FDA and has been authorized for detection and/or diagnosis of SARS-CoV-2 by FDA under an Emergency Use Authorization (EUA). This EUA will remain in effect (meaning this test can be used) for the duration of the COVID-19 declaration under Section 564(b)(1) of the Act, 21 U.S.C. section 360bbb-3(b)(1), unless the authorization is terminated or revoked.     Resp Syncytial Virus by PCR NEGATIVE NEGATIVE Final    Comment: (NOTE) Fact Sheet for Patients: BloggerCourse.com  Fact Sheet for Healthcare Providers: SeriousBroker.it  This test is not yet approved or cleared by the [place] FDA and has been authorized for detection and/or diagnosis of SARS-CoV-2 by FDA under an Emergency Use Authorization (EUA). This EUA will remain in effect (meaning this test can be used) for the duration of the COVID-19 declaration under Section 564(b)(1) of the Act, 21 U.S.C. section 360bbb-3(b)(1), unless the authorization is terminated or revoked.  Performed at [facility], [address], [place], [place] [address]   Respiratory (~20 pathogens) panel by PCR     Status: None   Collection Time: 07/18/23 12:50 AM   Specimen: Nasopharyngeal Swab; Respiratory  Result Value Ref Range  Status   Adenovirus NOT DETECTED NOT DETECTED Final   Coronavirus 229E NOT DETECTED NOT DETECTED Final    Comment: (NOTE) The Coronavirus on the Respiratory Panel, DOES NOT test for the novel  Coronavirus (2019 nCoV)    Coronavirus HKU1 NOT DETECTED NOT DETECTED Final   Coronavirus NL63 NOT DETECTED NOT DETECTED Final   Coronavirus OC43 NOT DETECTED NOT DETECTED Final   Metapneumovirus NOT DETECTED NOT DETECTED Final   Rhinovirus / Enterovirus NOT DETECTED NOT DETECTED Final   Influenza Tamarick Kovalcik NOT DETECTED NOT DETECTED Final   Influenza B NOT DETECTED NOT DETECTED Final   Parainfluenza Virus 1 NOT DETECTED NOT DETECTED Final   Parainfluenza Virus 2 NOT DETECTED NOT DETECTED Final   Parainfluenza Virus 3 NOT DETECTED NOT DETECTED Final   Parainfluenza Virus 4 NOT DETECTED NOT DETECTED Final   Respiratory Syncytial Virus NOT DETECTED NOT DETECTED Final   Bordetella pertussis NOT DETECTED NOT DETECTED Final   Bordetella Parapertussis NOT DETECTED NOT DETECTED Final   Chlamydophila pneumoniae NOT DETECTED NOT DETECTED Final   Mycoplasma pneumoniae NOT DETECTED NOT DETECTED Final    Comment: Performed at [facility], [address], [place], [place] [address]     Labs: Basic Metabolic Panel: Recent Labs  Lab 07/18/23 0100 07/18/23 0449 07/19/23 0533 07/21/23 0503 07/22/23 1552  NA 139 137 134* 138 138  K 4.2 4.0 3.8 3.9 3.6  CL 107 106 107 106 105  CO2 22 22 20* 25 26  GLUCOSE 91 87 78 81 95  BUN 13 13 16 15 14   CREATININE 1.18 1.36* 1.67* 1.38* 1.54*  CALCIUM 8.9 8.7* 8.3* 8.3* 8.3*  MG  --   --   --   --  1.9   Liver Function Tests: Recent Labs  Lab 07/18/23 0449  AST 24  ALT 15  ALKPHOS 64  BILITOT 2.0*  PROT 7.3  ALBUMIN 3.1*   No results for input(s): "LIPASE", "AMYLASE" in the last 168 hours. No results for input(s): "AMMONIA" in the last 168  hours. CBC: Recent Labs  Lab 07/18/23 0100 07/18/23 0449 07/21/23 0503 07/22/23 0500  WBC 3.8* 4.1 4.0 4.0   HGB 14.8 14.1 15.4 15.1  HCT 49.3 46.2 49.7 48.7  MCV 91.3 89.5 86.9 87.9  PLT 129* 131* 106* 119*   Cardiac Enzymes: No results for input(s): "CKTOTAL", "CKMB", "CKMBINDEX", "TROPONINI" in the last 168 hours. BNP: BNP (last 3 results) Recent Labs    05/30/23 2259 07/18/23 0100  BNP 2,811.8* 2,243.2*    ProBNP (last 3 results) No results for input(s): "PROBNP" in the last 8760 hours.  CBG: No results for input(s): "GLUCAP" in the last 168 hours.     Signed:  Lacretia Nicks MD.  [facility] 07/23/2023, 2:45 PM

## 2023-07-23 NOTE — TOC Transition Note (Signed)
Transition of Care [facility]) - Discharge Note   Patient Details  Name: Hector Manning MRN: [id number] Date of Birth: Mar 15, 1966  Transition of Care [facility]) CM/SW Contact:  Lanier Clam, RN Phone Number: 07/23/2023, 12:02 PM   Clinical Narrative: PCP list as resource provided. D/c plan home. No further CM needs.      Final next level of care: Home/Self Care Barriers to Discharge: No Barriers Identified   Patient Goals and CMS Choice Patient states their goals for this hospitalization and ongoing recovery are:: return home, currently living with his mother          Discharge Placement                       Discharge Plan and Services Additional resources added to the After Visit Summary for                                       Social Drivers of Health (SDOH) Interventions SDOH Screenings   Food Insecurity: No Food Insecurity (07/19/2023)  Housing: Low Risk  (07/20/2023)  Recent Concern: Housing - High Risk (07/19/2023)  Transportation Needs: No Transportation Needs (07/20/2023)  Utilities: Not At Risk (07/19/2023)  Alcohol Screen: Low Risk  (07/20/2023)  Financial Resource Strain: Medium Risk (07/20/2023)  Tobacco Use: Medium Risk (07/18/2023)     Readmission Risk Interventions    07/20/2023   10:25 AM  Readmission Risk Prevention Plan  Post Dischage Appt Complete  Medication Screening Complete  Transportation Screening Complete

## 2023-07-24 ENCOUNTER — Other Ambulatory Visit (HOSPITAL_COMMUNITY): Payer: Self-pay

## 2023-07-27 ENCOUNTER — Encounter (HOSPITAL_COMMUNITY): Payer: Self-pay

## 2023-07-27 NOTE — Telephone Encounter (Signed)
Has this person been contacted?

## 2023-07-27 NOTE — Telephone Encounter (Signed)
Spoke with sister and she stated patient is not around she will have him give [place] a call back

## 2023-07-28 NOTE — Telephone Encounter (Signed)
Spoke with his sister and she states patient does not have a phone and she is at work. She will have him give [place] a call tomorrow

## 2023-07-30 NOTE — Telephone Encounter (Signed)
Left message to call back

## 2023-08-03 ENCOUNTER — Ambulatory Visit (HOSPITAL_COMMUNITY)
Admission: RE | Admit: 2023-08-03 | Discharge: 2023-08-03 | Disposition: A | Discharge status: Home / Self Care | Payer: No Typology Code available for payment source | Source: Ambulatory Visit | Attending: Internal Medicine | Admitting: Internal Medicine

## 2023-08-03 ENCOUNTER — Telehealth (HOSPITAL_COMMUNITY): Payer: Self-pay

## 2023-08-03 ENCOUNTER — Other Ambulatory Visit (HOSPITAL_COMMUNITY): Payer: Self-pay

## 2023-08-03 ENCOUNTER — Encounter (HOSPITAL_COMMUNITY): Payer: Self-pay

## 2023-08-03 DIAGNOSIS — E8770 Fluid overload, unspecified: Secondary | ICD-10-CM | POA: Insufficient documentation

## 2023-08-03 DIAGNOSIS — I428 Other cardiomyopathies: Secondary | ICD-10-CM | POA: Insufficient documentation

## 2023-08-03 DIAGNOSIS — I10 Essential (primary) hypertension: Secondary | ICD-10-CM

## 2023-08-03 DIAGNOSIS — I513 Intracardiac thrombosis, not elsewhere classified: Secondary | ICD-10-CM

## 2023-08-03 DIAGNOSIS — Z7984 Long term (current) use of oral hypoglycemic drugs: Secondary | ICD-10-CM | POA: Insufficient documentation

## 2023-08-03 DIAGNOSIS — Z79899 Other long term (current) drug therapy: Secondary | ICD-10-CM | POA: Insufficient documentation

## 2023-08-03 DIAGNOSIS — I5041 Acute combined systolic (congestive) and diastolic (congestive) heart failure: Secondary | ICD-10-CM

## 2023-08-03 DIAGNOSIS — I5022 Chronic systolic (congestive) heart failure: Secondary | ICD-10-CM | POA: Diagnosis not present

## 2023-08-03 DIAGNOSIS — Z7901 Long term (current) use of anticoagulants: Secondary | ICD-10-CM | POA: Insufficient documentation

## 2023-08-03 DIAGNOSIS — I081 Rheumatic disorders of both mitral and tricuspid valves: Secondary | ICD-10-CM | POA: Diagnosis not present

## 2023-08-03 DIAGNOSIS — Z139 Encounter for screening, unspecified: Secondary | ICD-10-CM

## 2023-08-03 DIAGNOSIS — I38 Endocarditis, valve unspecified: Secondary | ICD-10-CM

## 2023-08-03 DIAGNOSIS — I110 Hypertensive heart disease with heart failure: Secondary | ICD-10-CM | POA: Diagnosis not present

## 2023-08-03 LAB — BASIC METABOLIC PANEL
Anion gap: 13 (ref 5–15)
BUN: 13 mg/dL (ref 6–20)
CO2: 24 mmol/L (ref 22–32)
Calcium: 9.4 mg/dL (ref 8.9–10.3)
Chloride: 104 mmol/L (ref 98–111)
Creatinine, Ser: 1.12 mg/dL (ref 0.61–1.24)
GFR, Estimated: >60 mL/min (ref >60)
Glucose, Bld: 83 mg/dL (ref 70–99)
Potassium: 4.2 mmol/L (ref 3.5–5.1)
Sodium: 141 mmol/L (ref 135–145)

## 2023-08-03 LAB — CBC
HCT: 45.9 % (ref 39.0–52.0)
Hemoglobin: 14.2 g/dL (ref 13.0–17.0)
MCH: 27.2 pg (ref 26.0–34.0)
MCHC: 30.9 g/dL (ref 30.0–36.0)
MCV: 87.8 fL (ref 80.0–100.0)
Platelets: 153 10*3/uL (ref 150–400)
RBC: 5.23 MIL/uL (ref 4.22–5.81)
RDW: 16.4 % — ABNORMAL HIGH (ref 11.5–15.5)
WBC: 4.6 10*3/uL (ref 4.0–10.5)
nRBC: 0 % (ref 0.0–0.2)

## 2023-08-03 LAB — BRAIN NATRIURETIC PEPTIDE: B Natriuretic Peptide: >4500 pg/mL — ABNORMAL HIGH (ref 0.0–100.0)

## 2023-08-03 MED ORDER — POTASSIUM CHLORIDE CRYS ER 20 MEQ PO TBCR: 40.0000 meq | EXTENDED_RELEASE_TABLET | Freq: Once | ORAL | 0 refills | Status: DC

## 2023-08-03 MED ORDER — EMPAGLIFLOZIN 10 MG PO TABS: 10.0000 mg | ORAL_TABLET | Freq: Every day | ORAL | 5 refills | Status: DC

## 2023-08-03 MED ORDER — METOLAZONE 2.5 MG PO TABS: 2.5000 mg | ORAL_TABLET | Freq: Every day | ORAL | 0 refills | Status: DC

## 2023-08-03 MED ORDER — FUROSEMIDE 20 MG PO TABS: 60.0000 mg | ORAL_TABLET | Freq: Every day | ORAL | 3 refills | Status: DC

## 2023-08-03 NOTE — Progress Notes (Signed)
Heart and Vascular Care Navigation  08/03/2023  Hector Manning [birth year] [id number]  Reason for Referral: transportation and food insecurity   Engaged with patient face to face for initial visit for Heart and Vascular Care Coordination.                                                                                                   Assessment:   CSW met with pt regarding above concerns.  Pt reports he lives with his mom who is elderly and not able to drive.  Does not have access to a car and depends on others for rides when needed.  States this usually works for him but sometimes he does have to cancel appts due to lack of transport.  CSW informed of clinic ability to assist with transport to clinic appts if he does not have someone to help him.  Also discussed need to apply for Medicaid as patient has no income or assets.  Pt provided with info about Medicaid workers at [employer] center and encouraged him to go there this afternoon following appt to apply with a caseworker.  Informed him this would allow him access to transportation if approved and would reduce cost of medications.  Pt admits to issues affording food- Heart and Vascular Food bad provided.  Reports he does not have food stamps at this time but wants to apply- transportation often a barrier for him.  Would like to apply over the phone- information about applying over he phone with [facility] [employer] provided.                                     HRT/VAS Care Coordination     Living arrangements for the past 2 months Single Family Home   Lives with: Parents   Patient Current Optometrist   Patient Has Concern With Paying Medical Bills No   Does Patient Have Prescription Coverage? Yes   Home Assistive Devices/Equipment None   DME Agency NA       Social History:                                                                             SDOH Screenings   Food Insecurity: Food  Insecurity Present (08/03/2023)  Housing: Low Risk  (07/20/2023)  Recent Concern: Housing - High Risk (07/19/2023)  Transportation Needs: No Transportation Needs (07/20/2023)  Utilities: Not At Risk (07/19/2023)  Alcohol Screen: Low Risk  (07/20/2023)  Financial Resource Strain: Medium Risk (07/20/2023)  Tobacco Use: Medium Risk (08/03/2023)    SDOH Interventions: Financial Resources:    Not working at this time- lives with his mom  Food Insecurity:  Food Insecurity Interventions: [employer]  Provided, Other (Comment) (Heart and Vascular Food bag) referred to [employer] to help apply for food stamps  Housing Insecurity:  Lives with mom- no concerns expressed  Transportation:   Depends on others for a ride- has canceled appts before due to inability to get a ride    Follow-up plan:    CSW will follow up with pt regarding progress with medicaid and food stamps.  Will also plan to discuss disability with pt.  Will continue to follow and assist as needed  Burna Sis, LCSW Clinical Social Worker [facility] Desk#: [phone] Cell#: [phone]

## 2023-08-03 NOTE — Progress Notes (Signed)
ReDS Vest / Clip - 08/03/23 1500       ReDS Vest / Clip   Station Marker C    Ruler Value 29    ReDS Value Range Moderate volume overload    ReDS Actual Value 41

## 2023-08-03 NOTE — Patient Instructions (Addendum)
Medication Changes:  TAKE ONE DOSE OF METOLAZONE 2.5MG  TODAY WITH OF POTASSIUM WITH THIS (2) TABLETS   INCREASE LASIX (FUROSEMIDE) TO 60MG  ONCE DAILY   RESTART: JARDIANCE DAILY WITH BREAKFAST   THESE MEDICATIONS HAVE BEEN SENT TO YOUR PHARMACY   Lab Work:  Labs done today, your results will be available in MyChart, we will contact you for abnormal readings.  THEN RETURN AGAIN FOR LABS IN 7-10 DAYS AS SCHEDULED   Follow-Up in: 3 WEEKS AS SCHEDULED   At the [facility], you and your health needs are our priority. We have a designated team specialized in the treatment of Heart Failure. This Care Team includes your primary Heart Failure Specialized Cardiologist (physician), Advanced Practice Providers (APPs- Physician Assistants and Nurse Practitioners), and Pharmacist who all work together to provide you with the care you need, when you need it.   You may see any of the following providers on your designated Care Team at your next follow up:  Dr. Arvilla Meres Dr. Marca Ancona Dr. Dorthula Nettles Dr. Theresia Bough Tonye Becket, NP Robbie Lis, [facility] [place], [place] Brynda Peon, NP [place] Lee, NP Karle Plumber, PharmD   Please be sure to bring in all your medications bottles to every appointment.   Need to Contact us:  If you have any questions or concerns before your next appointment please send [place] a message through [place] or call our office at [phone].    TO LEAVE A MESSAGE FOR THE NURSE SELECT OPTION 2, PLEASE LEAVE A MESSAGE INCLUDING: YOUR NAME DATE OF BIRTH CALL BACK NUMBER REASON FOR CALL**this is important as we prioritize the call backs  YOU WILL RECEIVE A CALL BACK THE SAME DAY AS LONG AS YOU CALL BEFORE 4:00 PM

## 2023-08-03 NOTE — Telephone Encounter (Signed)
Advanced Heart Failure Patient Advocate Encounter  Patient in office, stated that he is no longer working and his insurance will be ending soon.   Test claims show that this plan limits 30 days for [facility], with a copay of $15.  London Pepper is covered for 90 days with a $45 copay.  This patient is eligible to use copay savings cards. Advised to fill 1 month Entresto, 3 months Jardiance using copay cards, and have patient contact the office once insurance coverage ends so that we can start patient assistance application forms.  Burnell Blanks, CPhT Rx Patient Advocate Phone: [phone]

## 2023-08-03 NOTE — Progress Notes (Signed)
HEART & VASCULAR TRANSITION OF CARE CONSULT NOTE    Referring Physician:Dr. Lowell Guitar Primary Care: Patient, No Pcp Per  Primary Cardiologist: Christell Constant, MD   Chief Complaint: Feet swelling  HPI: Referred to clinic by Dr. Lowell Guitar for heart failure consultation.   Hector Manning is a 58 y.o. male with NiCM, chronic CHF, HTN and smoker.   Admitted 1/25 with CHF exacerbation. Had been out of meds for ~ 2 weeks. BP was severely elevated. Echo showed EF <20%, GIIIDD, and complex layered LV apical thrombus seen on echo. He was started on eliquis. Diuresed well with IV lasix. GDMT restarted.   Today he presents for AHF [facility] clinic visit. Overall feeling ok. Denies palpitations, CP, dizziness, or PND/Orthopnea. Has swelling around ankles that was persistent at discharge. SOB with exertion. Appetite ok. No fever or chills. Does not weight at home. Taking all medications. Denies ETOH, tobacco or drug use. Lives in a house with his mom.  Cardiac Testing  Echo 1/25: EF <20%, complex layered LV apical thrombus seen, GIIIDD, LV with GHK, RV moderately reduced. LA/RA severely dilated, mild-mod MR, mod TR [facility] 10/23: Normal right heart hemodynamics with low right and left heart diastolic filling pressures, mean PA pressure of 17 mmHg, LVEDP 8 mmHg, wedge pressure of 5 mmHg. Widely patent coronary arteries with no obstructive CAD  No past medical history on file.  Current Outpatient Medications  Medication Sig Dispense Refill   acetaminophen (TYLENOL) 500 MG tablet Take 1,000 mg by mouth every 4 (four) hours as needed for mild pain (pain score 1-3).     apixaban (ELIQUIS) 5 MG TABS tablet Take 1 tablet (5 mg total) by mouth 2 (two) times daily. Please deliver to bedside 60 tablet 1   atorvastatin (LIPITOR) 40 MG tablet Take 1 tablet (40 mg total) by mouth daily. 30 tablet 1   cyanocobalamin (VITAMIN B12) 1000 MCG tablet Take 1 tablet (1,000 mcg total) by mouth daily. 30 tablet 1    DM-Phenylephrine-Acetaminophen (ALKA-SELTZER PLS SINUS & COUGH) 10-5-325 MG CAPS Take 2 capsules by mouth every 6 (six) hours as needed (cough / cold).     empagliflozin (JARDIANCE) 10 MG TABS tablet Take 1 tablet (10 mg total) by mouth daily before breakfast. 30 tablet 5   ferrous sulfate 325 (65 FE) MG tablet Take 1 tablet (325 mg total) by mouth every other day. 15 tablet 1   folic acid (FOLVITE) 1 MG tablet Take 1 tablet (1 mg total) by mouth daily. 30 tablet 1   gabapentin (NEURONTIN) 100 MG capsule Take 1 capsule (100 mg total) by mouth 3 (three) times daily. 90 capsule 1   metolazone (ZAROXOLYN) 2.5 MG tablet Take 1 tablet (2.5 mg total) by mouth daily. 1 tablet 0   metoprolol succinate (TOPROL-XL) 25 MG 24 hr tablet Take 1 tablet (25 mg total) by mouth daily. Take with or immediately following a meal. 30 tablet 1   potassium chloride SA (KLOR-CON M) 20 MEQ tablet Take 2 tablets (40 mEq total) by mouth once for 1 dose. 2 tablet 0   sacubitril-valsartan (ENTRESTO) 49-51 MG Take 1 tablet by mouth 2 (two) times daily. 60 tablet 1   spironolactone (ALDACTONE) 25 MG tablet Take 0.5 tablets (12.5 mg total) by mouth daily. 15 tablet 1   furosemide (LASIX) 20 MG tablet Take 3 tablets (60 mg total) by mouth daily. 90 tablet 3   naphazoline-pheniramine (VISINE) 0.025-0.3 % ophthalmic solution Place 2 drops into both eyes  2 (two) times daily as needed for eye irritation. (Patient not taking: Reported on 05/31/2023)     No current facility-administered medications for this encounter.    No Known Allergies    Social History   Socioeconomic History   Marital status: Single    Spouse name: Not on file   Number of children: 4   Years of education: Not on file   Highest education level: High school graduate  Occupational History   Occupation: [employer]    Comment: receiving NO checks at this moment  Tobacco Use   Smoking status: Former    Current packs/day: 0.00    Types: Cigarettes    Quit  date: 04/13/2022    Years since quitting: 1.3   Smokeless tobacco: Never  Vaping Use   Vaping status: Never Used  Substance and Sexual Activity   Alcohol use: Not Currently    Comment: couple beers a day   Drug use: No   Sexual activity: Not on file  Other Topics Concern   Not on file  Social History Narrative   Not on file   Social Drivers of Health   Financial Resource Strain: Medium Risk (07/20/2023)   Overall Financial Resource Strain (CARDIA)    Difficulty of Paying Living Expenses: Somewhat hard  Food Insecurity: No Food Insecurity (07/19/2023)   Hunger Vital Sign    Worried About Running Out of Food in the Last Year: Never true    Ran Out of Food in the Last Year: Never true  Transportation Needs: No Transportation Needs (07/20/2023)   PRAPARE - Administrator, Civil Service (Medical): No    Lack of Transportation (Non-Medical): No  Physical Activity: Not on file  Stress: Not on file  Social Connections: Not on file  Intimate Partner Violence: Not At Risk (07/19/2023)   Humiliation, Afraid, Rape, and Kick questionnaire    Fear of Current or Ex-Partner: No    Emotionally Abused: No    Physically Abused: No    Sexually Abused: No     No family history on file.  There were no vitals filed for this visit.  PHYSICAL EXAM: General:  well appearing.  No respiratory difficulty. Walked into clinic.  HEENT: normal Neck: supple. JVD difficult to see. Carotids 2+ bilat; no bruits. No lymphadenopathy or thyromegaly appreciated. Cor: PMI nondisplaced. Regular rate & rhythm. No rubs, gallops or murmurs. Lungs: clear Abdomen: soft, nontender, nondistended. No hepatosplenomegaly. No bruits or masses. Good bowel sounds. Extremities: no cyanosis, clubbing, rash, +1-2 BLE  edema  Neuro: alert & oriented x 3, cranial nerves grossly intact. moves all 4 extremities w/o difficulty. Affect pleasant.   ECG: NSR 100 bpm (Personally reviewed)    ASSESSMENT & PLAN: Chronic  systolic heart failure - Echo 1/61: EF <20%, complex layered LV apical thrombus seen, LV with GHK, RV moderately reduced. LA/RA severely dilated, mild-mod MR, mod TR - L/RHC 10/23: Normal right heart hemodynamics with low right and left heart diastolic filling pressures, mean PA pressure of 17 mmHg, LVEDP 8 mmHg, wedge pressure of 5 mmHg. Widely patent coronary arteries with no obstructive CAD - suspect 2/2 previous ETOH abuse. There have been issues with noncompliance and presenting for follow-ups.  NYHA II-IIIa, mildly volume overloaded on exam. ReDs reading: 41 %, abnormal GDMT: Diuretic- Volume overloaded today. Give 2.5 metolazone +13mEq KDUR today. Increase Torsemide 40>60 mg daily. BMET/BNP today BB-  toprol XL 25 mg daily Ace/ARB/ARNI- Entresto 49-51 mg BID. Also provided with co-pay card (should  be able to get monthly for ~10$'s) MRA Spiro 12.5 mg daily SGLT2i Restart Jardiance 10 mg daily, previously stopped d/t cost. Now with insurance, discussed with pharmacist. Will provide co-pay card, should have low co-pay and he should be able to get a 3 month supply  LV apical thrombus - seen on echo 1/25 - Continue eliquis 5 mg BID - will need repeat echo in ~3 months  Valvular heart disease - moderate MR and TR on echo 1/25 - Continue to follow  HTN - Continue Entresto and metoprolol  5. SDOH - HFSW to see today - Provided patient with pillbox today - Would benefit from paramedicine but not [facility]. Compliance questionable.   Referred to HFSW (PCP, Medications, Transportation, ETOH Abuse, Drug Abuse, Insurance, Financial ): Yes: transportation, financial and needs food Refer to Pharmacy: No. Discussed cost with them today. Provided patient with co-pay cards Refer to Home Health: No Refer to Advanced Heart Failure Clinic: Yes or no  Refer to General Cardiology: Yes or No  Follow up in 4 weeks to reassess volume.

## 2023-08-06 ENCOUNTER — Telehealth (HOSPITAL_COMMUNITY): Payer: Self-pay | Admitting: Licensed Clinical Social Worker

## 2023-08-06 NOTE — Telephone Encounter (Signed)
 CSW attempted to call pt to check in regarding medicaid and discuss potential disability eligibility.  Unable to reach- left VM requesting return call  Hector HILARIO Leech, LCSW Clinical Social Worker [facility] Desk#: [phone] Cell#: [phone]

## 2023-08-10 ENCOUNTER — Telehealth (HOSPITAL_COMMUNITY): Payer: Self-pay | Admitting: Licensed Clinical Social Worker

## 2023-08-10 NOTE — Telephone Encounter (Signed)
 Attempted to call pt to discuss medicaid/disability- unable to reach- left VM requesting return call  Dyon Rotert H. Delano Scardino, LCSW Clinical Social Worker [facility] Desk#: [phone] Cell#: [phone]

## 2023-08-13 ENCOUNTER — Encounter (HOSPITAL_COMMUNITY): Payer: Self-pay

## 2023-08-13 ENCOUNTER — Other Ambulatory Visit (HOSPITAL_COMMUNITY): Payer: No Typology Code available for payment source

## 2023-08-14 ENCOUNTER — Ambulatory Visit: Payer: Self-pay | Admitting: Nurse Practitioner

## 2023-08-14 ENCOUNTER — Other Ambulatory Visit (HOSPITAL_COMMUNITY): Payer: No Typology Code available for payment source

## 2023-08-17 ENCOUNTER — Ambulatory Visit (HOSPITAL_COMMUNITY)
Admission: RE | Admit: 2023-08-17 | Discharge: 2023-08-17 | Disposition: A | Discharge status: Home / Self Care | Payer: No Typology Code available for payment source | Source: Ambulatory Visit | Attending: Cardiology | Admitting: Cardiology

## 2023-08-17 DIAGNOSIS — I5022 Chronic systolic (congestive) heart failure: Secondary | ICD-10-CM | POA: Diagnosis present

## 2023-08-17 LAB — BASIC METABOLIC PANEL WITH GFR
Anion gap: 8 (ref 5–15)
BUN: 21 mg/dL — ABNORMAL HIGH (ref 6–20)
CO2: 28 mmol/L (ref 22–32)
Calcium: 9.4 mg/dL (ref 8.9–10.3)
Chloride: 104 mmol/L (ref 98–111)
Creatinine, Ser: 1.27 mg/dL — ABNORMAL HIGH (ref 0.61–1.24)
GFR, Estimated: >60 mL/min
Glucose, Bld: 126 mg/dL — ABNORMAL HIGH (ref 70–99)
Potassium: 4.1 mmol/L (ref 3.5–5.1)
Sodium: 140 mmol/L (ref 135–145)

## 2023-08-24 ENCOUNTER — Ambulatory Visit (HOSPITAL_COMMUNITY)
Admission: RE | Admit: 2023-08-24 | Discharge: 2023-08-24 | Disposition: A | Discharge status: Home / Self Care | Payer: No Typology Code available for payment source | Source: Ambulatory Visit | Attending: Cardiology

## 2023-08-24 ENCOUNTER — Encounter (HOSPITAL_COMMUNITY): Payer: Self-pay

## 2023-08-24 VITALS — BP 134/82 | HR 71 | Wt 205.6 lb

## 2023-08-24 DIAGNOSIS — I513 Intracardiac thrombosis, not elsewhere classified: Secondary | ICD-10-CM | POA: Diagnosis not present

## 2023-08-24 DIAGNOSIS — Z87891 Personal history of nicotine dependence: Secondary | ICD-10-CM | POA: Diagnosis not present

## 2023-08-24 DIAGNOSIS — Z7984 Long term (current) use of oral hypoglycemic drugs: Secondary | ICD-10-CM | POA: Insufficient documentation

## 2023-08-24 DIAGNOSIS — M79676 Pain in unspecified toe(s): Secondary | ICD-10-CM | POA: Insufficient documentation

## 2023-08-24 DIAGNOSIS — I428 Other cardiomyopathies: Secondary | ICD-10-CM | POA: Diagnosis not present

## 2023-08-24 DIAGNOSIS — I10 Essential (primary) hypertension: Secondary | ICD-10-CM

## 2023-08-24 DIAGNOSIS — I5022 Chronic systolic (congestive) heart failure: Secondary | ICD-10-CM | POA: Diagnosis present

## 2023-08-24 DIAGNOSIS — Z91148 Patient's other noncompliance with medication regimen for other reason: Secondary | ICD-10-CM | POA: Insufficient documentation

## 2023-08-24 DIAGNOSIS — I5020 Unspecified systolic (congestive) heart failure: Secondary | ICD-10-CM

## 2023-08-24 DIAGNOSIS — I38 Endocarditis, valve unspecified: Secondary | ICD-10-CM

## 2023-08-24 DIAGNOSIS — Z7901 Long term (current) use of anticoagulants: Secondary | ICD-10-CM | POA: Diagnosis not present

## 2023-08-24 DIAGNOSIS — Z79899 Other long term (current) drug therapy: Secondary | ICD-10-CM | POA: Insufficient documentation

## 2023-08-24 DIAGNOSIS — M79671 Pain in right foot: Secondary | ICD-10-CM | POA: Diagnosis not present

## 2023-08-24 DIAGNOSIS — I110 Hypertensive heart disease with heart failure: Secondary | ICD-10-CM | POA: Insufficient documentation

## 2023-08-24 LAB — URIC ACID: Uric Acid, Serum: 7.6 mg/dL (ref 3.7–8.6)

## 2023-08-24 NOTE — Progress Notes (Signed)
 ReDS Vest / Clip - 08/24/23 1500       ReDS Vest / Clip   Station Marker C    Ruler Value 30    ReDS Value Range Low volume    ReDS Actual Value 34

## 2023-08-24 NOTE — Patient Instructions (Addendum)
 Great to see you today  Labs done today, your results will be available in MyChart, we will contact you for abnormal readings.   Please return tomorrow at 2 pm and bring all of your medications   Do the following things EVERYDAY: Weigh yourself in the morning before breakfast. Write it down and keep it in a log. Take your medicines as prescribed Eat low salt foods--Limit salt (sodium) to 2000 mg per day.  Stay as active as you can everyday Limit all fluids for the day to less than 2 liters

## 2023-08-24 NOTE — Progress Notes (Addendum)
 HEART & VASCULAR TRANSITION OF CARE CLINIC NOTE    Referring Physician:Dr. Lowell Guitar Primary Care: Patient, No Pcp Per  Primary Cardiologist: Christell Constant, MD   Chief Complaint: Chronic systolic CHF  HPI: Hector Manning is a 58 y.o. male with NiCM, chronic systolic CHF, HTN, ETOH and tobacco abuse.  Admitted 1/25 with CHF exacerbation. Had been out of meds for over a month. BP was severely elevated. Echo showed EF <20%, GIIIDD, and complex layered LV apical thrombus seen on echo. He was started on eliquis. Diuresed well with IV lasix. GDMT restarted.   He was [facility] in [facility] clinic [facility] for hospital CHF follow-up. He was volume overloaded. ReDS 41%. He was instructed to take 2.5 mg metolazone X 1 and Torsemide increased to 60 mg daily. Jardiance restarted.   He is here today for close follow-up. Reports taking all medications as prescribed but cannot confirm the names. He is almost out of some but can't tell me which ones. He did not bring a list or pill bottles. Notes occasional shortness of breath but cannot describe further. No orthopnea or PND. His main concern is b/l great toe and dorsal foot pain X 3-4 days. It hurts to bear weight on his feet. He is concerned about gout.  Denies ETOH, tobacco or drug use. Lives in a house with his mom.  Cardiac Testing  Echo 1/25: EF <20%, complex layered LV apical thrombus seen, GIIIDD, LV with GHK, RV moderately reduced. LA/RA severely dilated, mild-mod MR, mod TR [facility] 10/23: Normal right heart hemodynamics with low right and left heart diastolic filling pressures, mean PA pressure of 17 mmHg, LVEDP 8 mmHg, wedge pressure of 5 mmHg. Widely patent coronary arteries with no obstructive CAD  No past medical history on file.  Current Outpatient Medications  Medication Sig Dispense Refill   acetaminophen (TYLENOL) 500 MG tablet Take 1,000 mg by mouth every 4 (four) hours as needed for mild pain (pain score 1-3).     apixaban (ELIQUIS) 5  MG TABS tablet Take 1 tablet (5 mg total) by mouth 2 (two) times daily. Please deliver to bedside 60 tablet 1   atorvastatin (LIPITOR) 40 MG tablet Take 1 tablet (40 mg total) by mouth daily. 30 tablet 1   cyanocobalamin (VITAMIN B12) 1000 MCG tablet Take 1 tablet (1,000 mcg total) by mouth daily. 30 tablet 1   empagliflozin (JARDIANCE) 10 MG TABS tablet Take 1 tablet (10 mg total) by mouth daily before breakfast. 30 tablet 5   ferrous sulfate 325 (65 FE) MG tablet Take 1 tablet (325 mg total) by mouth every other day. 15 tablet 1   folic acid (FOLVITE) 1 MG tablet Take 1 tablet (1 mg total) by mouth daily. 30 tablet 1   furosemide (LASIX) 20 MG tablet Take 3 tablets (60 mg total) by mouth daily. 90 tablet 3   gabapentin (NEURONTIN) 100 MG capsule Take 1 capsule (100 mg total) by mouth 3 (three) times daily. 90 capsule 1   metolazone (ZAROXOLYN) 2.5 MG tablet Take 1 tablet (2.5 mg total) by mouth daily. 1 tablet 0   metoprolol succinate (TOPROL-XL) 25 MG 24 hr tablet Take 1 tablet (25 mg total) by mouth daily. Take with or immediately following a meal. 30 tablet 1   sacubitril-valsartan (ENTRESTO) 49-51 MG Take 1 tablet by mouth 2 (two) times daily. 60 tablet 1   spironolactone (ALDACTONE) 25 MG tablet Take 0.5 tablets (12.5 mg total) by mouth daily. 15 tablet 1  No current facility-administered medications for this encounter.    No Known Allergies    Social History   Socioeconomic History   Marital status: Single    Spouse name: Not on file   Number of children: 4   Years of education: Not on file   Highest education level: High school graduate  Occupational History   Occupation: [employer]    Comment: receiving NO checks at this moment  Tobacco Use   Smoking status: Former    Current packs/day: 0.00    Types: Cigarettes    Quit date: 04/13/2022    Years since quitting: 1.3   Smokeless tobacco: Never  Vaping Use   Vaping status: Never Used  Substance and Sexual Activity    Alcohol use: Not Currently    Comment: couple beers a day   Drug use: No   Sexual activity: Not on file  Other Topics Concern   Not on file  Social History Narrative   Not on file   Social Drivers of Health   Financial Resource Strain: Medium Risk (07/20/2023)   Overall Financial Resource Strain (CARDIA)    Difficulty of Paying Living Expenses: Somewhat hard  Food Insecurity: Food Insecurity Present (08/03/2023)   Hunger Vital Sign    Worried About Running Out of Food in the Last Year: Sometimes true    Ran Out of Food in the Last Year: Sometimes true  Transportation Needs: No Transportation Needs (07/20/2023)   PRAPARE - Administrator, Civil Service (Medical): No    Lack of Transportation (Non-Medical): No  Physical Activity: Not on file  Stress: Not on file  Social Connections: Not on file  Intimate Partner Violence: Not At Risk (07/19/2023)   Humiliation, Afraid, Rape, and Kick questionnaire    Fear of Current or Ex-Partner: No    Emotionally Abused: No    Physically Abused: No    Sexually Abused: No     No family history on file.  Vitals:   08/24/23 1508  BP: 134/82  Pulse: 71  SpO2: 97%  Weight: 93.3 kg (205 lb 9.6 oz)    PHYSICAL EXAM: General:  Well appearing.  Neck: no JVD.  Cor: Regular rate & rhythm. No rubs, gallops or murmurs. Lungs: clear Abdomen: soft, nontender, nondistended. Extremities: edema, R great toes and dorsal feet are tender with palpation. Feet are warm with no open wounds. Neuro: alert & orientedx3. Affect pleasant    ASSESSMENT & PLAN: Chronic systolic heart failure/NICM -Echo 10/23: EF 25-30%, RV okay -L/RHC 10/23: Low filling pressures, Fick CI 2.34 L/min/m2, no CAD -Echo 1/25: EF <20%, complex layered LV apical thrombus seen, LV with GHK, RV moderately reduced. LA/RA severely dilated, mild-mod MR, mod TR -suspect 2/2 HTN +/- ETOH.  -Eventual cMRI -NYHA status difficult to assess (poor historian), suspect III. Volume  looks okay on exam and by ReDS which is 34%. Continue current dose of lasix, 60 mg daily. -Continue Toprol XL 25 mg daily -Continue Entresto 49-51 mg BID.  -Continue Spiro 12.5 mg daily -Continue Jardiance 10 mg daily -Will need repeat echo in 3 months  LV apical thrombus -seen on echo 1/25 -Continue eliquis 5 mg BID  Valvular heart disease -moderate MR and TR on echo 1/25 -Reassess on repeat echo in 3 months. Hopefully will improve with optimization of medical therapy.  HTN -BP slightly above goal. Unclear what meds he is taking. He will return tomorrow for medication review.  5. SDOH -Would benefit from paramedicine but he is  not [facility]. High risk for readmission. Compliance questionable. He does not know his medications. Will have him come to clinic for nurse visit with his pill bottes tomorrow. -HFSW to see regarding disability.    Follow up 2-4 weeks to establish with Dr. Elwyn Lade

## 2023-08-25 ENCOUNTER — Other Ambulatory Visit: Payer: Self-pay

## 2023-08-25 ENCOUNTER — Ambulatory Visit (HOSPITAL_COMMUNITY)
Admission: RE | Admit: 2023-08-25 | Discharge: 2023-08-25 | Disposition: A | Discharge status: Home / Self Care | Payer: No Typology Code available for payment source | Source: Ambulatory Visit | Attending: Cardiology | Admitting: Cardiology

## 2023-08-25 ENCOUNTER — Other Ambulatory Visit (HOSPITAL_COMMUNITY): Payer: Self-pay

## 2023-08-25 ENCOUNTER — Telehealth (HOSPITAL_COMMUNITY): Payer: Self-pay | Admitting: Licensed Clinical Social Worker

## 2023-08-25 VITALS — BP 124/78 | HR 71

## 2023-08-25 DIAGNOSIS — Z008 Encounter for other general examination: Secondary | ICD-10-CM | POA: Insufficient documentation

## 2023-08-25 DIAGNOSIS — I5022 Chronic systolic (congestive) heart failure: Secondary | ICD-10-CM

## 2023-08-25 MED ORDER — SACUBITRIL-VALSARTAN 49-51 MG PO TABS: 1.0000 | ORAL_TABLET | Freq: Two times a day (BID) | ORAL | 2 refills | Status: DC

## 2023-08-25 MED ORDER — APIXABAN 5 MG PO TABS: 5.0000 mg | ORAL_TABLET | Freq: Two times a day (BID) | ORAL | 2 refills | Status: DC

## 2023-08-25 MED ORDER — FUROSEMIDE 20 MG PO TABS
60.0000 mg | ORAL_TABLET | Freq: Every day | ORAL | 2 refills | Status: DC
  Filled 2023-08-25: qty 90, 30d supply, fill #0

## 2023-08-25 MED ORDER — PREDNISONE 20 MG PO TABS: 40.0000 mg | ORAL_TABLET | ORAL | 0 refills | Status: AC

## 2023-08-25 MED ORDER — FUROSEMIDE 20 MG PO TABS: 60.0000 mg | ORAL_TABLET | Freq: Every day | ORAL | 3 refills | Status: DC

## 2023-08-25 MED ORDER — SACUBITRIL-VALSARTAN 49-51 MG PO TABS
1.0000 | ORAL_TABLET | Freq: Two times a day (BID) | ORAL | 2 refills | Status: DC
  Filled 2023-08-25: qty 60, 30d supply, fill #0

## 2023-08-25 MED ORDER — SPIRONOLACTONE 25 MG PO TABS: 12.5000 mg | ORAL_TABLET | Freq: Every day | ORAL | 2 refills | Status: DC

## 2023-08-25 MED ORDER — ATORVASTATIN CALCIUM 40 MG PO TABS
40.0000 mg | ORAL_TABLET | Freq: Every day | ORAL | 2 refills | Status: DC
  Filled 2023-08-25 (×2): qty 30, 30d supply, fill #0

## 2023-08-25 MED ORDER — APIXABAN 5 MG PO TABS
5.0000 mg | ORAL_TABLET | Freq: Two times a day (BID) | ORAL | 2 refills | Status: DC
  Filled 2023-08-25 (×2): qty 60, 30d supply, fill #0

## 2023-08-25 MED ORDER — EMPAGLIFLOZIN 10 MG PO TABS
10.0000 mg | ORAL_TABLET | Freq: Every day | ORAL | 2 refills | Status: DC
  Filled 2023-08-25: qty 30, 30d supply, fill #0

## 2023-08-25 MED ORDER — METOPROLOL SUCCINATE ER 25 MG PO TB24
25.0000 mg | ORAL_TABLET | Freq: Every day | ORAL | 2 refills | Status: DC
  Filled 2023-08-25: qty 30, 30d supply, fill #0

## 2023-08-25 MED ORDER — ATORVASTATIN CALCIUM 40 MG PO TABS
40.0000 mg | ORAL_TABLET | Freq: Every day | ORAL | 2 refills | Status: DC
Start: 2023-08-25 — End: 2023-08-25

## 2023-08-25 MED ORDER — SPIRONOLACTONE 25 MG PO TABS
12.5000 mg | ORAL_TABLET | Freq: Every day | ORAL | 2 refills | Status: DC
  Filled 2023-08-25: qty 15, 30d supply, fill #0

## 2023-08-25 MED ORDER — EMPAGLIFLOZIN 10 MG PO TABS: 10.0000 mg | ORAL_TABLET | Freq: Every day | ORAL | 5 refills | Status: DC

## 2023-08-25 MED ORDER — METOPROLOL SUCCINATE ER 25 MG PO TB24: 25.0000 mg | ORAL_TABLET | Freq: Every day | ORAL | 2 refills | Status: DC

## 2023-08-25 NOTE — Telephone Encounter (Signed)
 H&V Care Navigation CSW Progress Note  Clinical Social Worker informed that pt in need of transportation to clinic today.  Pt has no one who can take him at this time- had to pay someone $30 to get to appt yesterday which is not affordable for him as he does not work and has not worked for 11months.  CSW arranged ride through [employer]- pt to sign waiver in clinic.   SDOH Screenings   Food Insecurity: Food Insecurity Present (08/03/2023)  Housing: Low Risk  (07/20/2023)  Recent Concern: Housing - High Risk (07/19/2023)  Transportation Needs: Unmet Transportation Needs (08/25/2023)  Utilities: Not At Risk (07/19/2023)  Alcohol Screen: Low Risk  (07/20/2023)  Financial Resource Strain: Medium Risk (07/20/2023)  Tobacco Use: Medium Risk (08/24/2023)   Burna Sis, LCSW Clinical Social Worker [facility] Desk#: [phone] Cell#: [phone]

## 2023-08-25 NOTE — Progress Notes (Signed)
 H&V Care Navigation CSW Progress Note  Clinical Social Worker met with pt in clinic to follow up on insurance/disability.  Helped complete referral for pt to [facility] and turned in.  Walked patient over to [employer] to meet with [employer] workers and apply for Medicaid.   SDOH Screenings   Food Insecurity: Food Insecurity Present (08/03/2023)  Housing: Low Risk  (07/20/2023)  Recent Concern: Housing - High Risk (07/19/2023)  Transportation Needs: Unmet Transportation Needs (08/25/2023)  Utilities: Not At Risk (07/19/2023)  Alcohol Screen: Low Risk  (07/20/2023)  Financial Resource Strain: High Risk (08/25/2023)  Tobacco Use: Medium Risk (08/24/2023)   Will assist pt in following up as needed  Burna Sis, LCSW Clinical Social Worker [facility] Desk#: [phone] Cell#: [phone]

## 2023-08-25 NOTE — Addendum Note (Signed)
 Encounter addended by: Burna Sis, LCSW on: 08/25/2023 4:39 PM  Actions taken: Flowsheet accepted, Clinical Note Signed

## 2023-08-25 NOTE — Patient Instructions (Addendum)
 Medication Changes:  START Prednisone 40mg  (2 tabs) daily FOR 3 DAYS ONLY  INCREASE Furosemide to 80mg  (4 tabs) FOR 3 DAYS ONLY. THEN RESUME the Furosemide 60mg  (3 tabs) daily.   Special Instructions // Education:  Do the following things EVERYDAY: Weigh yourself in the morning before breakfast. Write it down and keep it in a log. Take your medicines as prescribed Eat low salt foods--Limit salt (sodium) to 2000 mg per day.  Stay as active as you can everyday Limit all fluids for the day to less than 2 liters   Follow-Up in: Keep follow up appointment on March 11th.   At the [facility], you and your health needs are our priority. We have a designated team specialized in the treatment of Heart Failure. This Care Team includes your primary Heart Failure Specialized Cardiologist (physician), Advanced Practice Providers (APPs- Physician Assistants and Nurse Practitioners), and Pharmacist who all work together to provide you with the care you need, when you need it.   You may see any of the following providers on your designated Care Team at your next follow up:  Dr. Arvilla Meres Dr. Marca Ancona Dr. Dorthula Nettles Dr. Theresia Bough Tonye Becket, NP Robbie Lis, [place] [facility] Rennert, [place] Brynda Peon, NP [place] Lee, NP Karle Plumber, PharmD   Please be sure to bring in all your medications bottles to every appointment.   Need to Contact us:  If you have any questions or concerns before your next appointment please send [place] a message through [place] or call our office at [phone].    TO LEAVE A MESSAGE FOR THE NURSE SELECT OPTION 2, PLEASE LEAVE A MESSAGE INCLUDING: YOUR NAME DATE OF BIRTH CALL BACK NUMBER REASON FOR CALL**this is important as we prioritize the call backs  YOU WILL RECEIVE A CALL BACK THE SAME DAY AS LONG AS YOU CALL BEFORE 4:00 PM

## 2023-08-25 NOTE — Progress Notes (Signed)
 Pt brought all medication bottles. Reviewed and corrected medication list.   Per Anna Genre PA-C Uric acid normal but still have clinical suspicion for gout. Can give prednisone 40 x 3 days for trial. During that time increase lasix from 60 mg to 80 mg daily then back to 60 mg daily after 3 days   Orders placed and reviewed with pt. No further questions at this time.

## 2023-08-26 NOTE — Progress Notes (Deleted)
 Cardiology Office Note:  .   Date:  08/26/2023  ID:  Hector Manning, DOB 1966/05/29, MRN [id number] PCP: Patient, No Pcp Per  [facility] Providers Cardiologist:  Christell Constant, MD {  History of Present Illness: .   Hector Manning is a 58 y.o. male with a past medical history significant for hypertension, congestive heart failure, iron deficiency anemia who recently presented to the ER with left-sided chest pain and is here for follow-up appointment.  He was noted to have severe heart failure with EF less than 20%.  Also was diagnosed with ventricular thrombosis.  Workup in the ER included chest x-ray with vascular congestion and layering bilateral effusions.  On Lasix with dry weight to 215 to 220 pounds.  On spironolactone, Entresto, metoprolol, SGLT2 inhibitor prohibitively expensive and needs outpatient follow-up for this.  His echocardiogram showed LVEF less than 20%, grade 3 diastolic dysfunction, moderately reduced RV SF, large pleural effusion, mild to moderate MR, and moderate TR.  Started on Eliquis for ventricular thrombus which measured 2.7 x 1 cm.  Also with peripheral neuropathy started on gabapentin.  History of alcohol and nicotine abuse but he endorsed quitting everything 3 months ago.  Today, he***  ROS: Pertinent ROS in HPI  Studies Reviewed: .       Echocardiogram 07/18/2023 IMPRESSIONS     1. Complex, layered LV apical thrombus with the largest dimension  measuring 2.7x1cm. Left ventricular ejection fraction, by estimation, is  <20%. The left ventricle has severely decreased function. The left  ventricle demonstrates global hypokinesis. The  left ventricular internal cavity size was severely dilated. Left  ventricular diastolic parameters are consistent with Grade III diastolic  dysfunction (restrictive).   2. Right ventricular systolic function is moderately reduced. The right  ventricular size is severely enlarged. There is normal pulmonary artery   systolic pressure. The estimated right ventricular systolic pressure is  34.4 mmHg.   3. Left atrial size was severely dilated.   4. Right atrial size was severely dilated.   5. Large pleural effusion in the left lateral region.   6. Mitral regurgitation related to posterior restriction, suspect degree  is underestimated; eccentric jet on A2c view. The mitral valve is  abnormal. Mild to moderate mitral valve regurgitation.   7. Tricuspid valve regurgitation is moderate.   8. The aortic valve has an indeterminant number of cusps. Aortic valve  regurgitation is not visualized.   9. The inferior vena cava is dilated in size with <50% respiratory  variability, suggesting right atrial pressure of 15 mmHg.   Comparison(s): Prior images reviewed side by side. Biventricular disease  has worsened, valve regurgitation has worsened, and left apical appendage  thrombus is new.   Conclusion(s)/Recommendation(s): Will reach out to primary team.   Risk Assessment/Calculations:   {Does this patient have ATRIAL FIBRILLATION?:[phone]}         Physical Exam:   VS:  There were no vitals taken for this visit.   Wt Readings from Last 3 Encounters:  08/24/23 205 lb 9.6 oz (93.3 kg)  07/23/23 205 lb 12.8 oz (93.4 kg)  04/26/22 224 lb 13.9 oz (102 kg)    GEN: Well nourished, well developed in no acute distress NECK: No JVD; No carotid bruits CARDIAC: ***RRR, no murmurs, rubs, gallops RESPIRATORY:  Clear to auscultation without rales, wheezing or rhonchi  ABDOMEN: Soft, non-tender, non-distended EXTREMITIES:  No edema; No deformity   ASSESSMENT AND PLAN: .   Systolic and diastolic CHF Hypertension CKD  Anemia of chronic disease Ventricular thrombus    {Are you ordering a CV Procedure (e.g. stress test, cath, DCCV, TEE, etc)?   Press F2        :[id number]}  Dispo: ***  Signed, Sharlene Dory, PA-C

## 2023-08-27 ENCOUNTER — Ambulatory Visit: Payer: No Typology Code available for payment source | Attending: Physician Assistant | Admitting: Physician Assistant

## 2023-08-27 ENCOUNTER — Encounter (HOSPITAL_COMMUNITY): Payer: No Typology Code available for payment source

## 2023-08-27 DIAGNOSIS — I38 Endocarditis, valve unspecified: Secondary | ICD-10-CM

## 2023-08-27 DIAGNOSIS — I10 Essential (primary) hypertension: Secondary | ICD-10-CM

## 2023-08-27 DIAGNOSIS — I5022 Chronic systolic (congestive) heart failure: Secondary | ICD-10-CM

## 2023-08-27 DIAGNOSIS — N1831 Chronic kidney disease, stage 3a: Secondary | ICD-10-CM

## 2023-08-27 DIAGNOSIS — I513 Intracardiac thrombosis, not elsewhere classified: Secondary | ICD-10-CM

## 2023-09-03 ENCOUNTER — Other Ambulatory Visit (HOSPITAL_COMMUNITY): Payer: Self-pay

## 2023-09-03 ENCOUNTER — Telehealth (HOSPITAL_COMMUNITY): Payer: Self-pay | Admitting: Licensed Clinical Social Worker

## 2023-09-03 NOTE — Telephone Encounter (Signed)
 H&V Care Navigation CSW Progress Note  Clinical Social Worker received call from pt requesting help with coming to appt next week.  CSW able to arrange taxi to come to appt- will be picked up at 1:45pm for appt.  Was just approved for Medicaid- awaiting assignment to managed plan- will discuss alternative medicaid transport options once he receives.  SDOH Screenings   Food Insecurity: Food Insecurity Present (08/03/2023)  Housing: Low Risk  (07/20/2023)  Recent Concern: Housing - High Risk (07/19/2023)  Transportation Needs: Unmet Transportation Needs (09/03/2023)  Utilities: Not At Risk (07/19/2023)  Alcohol Screen: Low Risk  (07/20/2023)  Financial Resource Strain: High Risk (08/25/2023)  Tobacco Use: Medium Risk (08/24/2023)   09/03/2023  Hector Manning DOB: [birth year] MRN: [id number]   RIDER WAIVER AND RELEASE OF LIABILITY  For the purposes of helping with transportation needs, [place] partners with outside transportation providers (taxi companies, [place], Catering manager.) to give [employer] patients or other approved people the choice of on-demand rides [employer]") to our buildings for non-emergency visits.  By using [employer], I, the person signing this document, on behalf of myself and/or any legal minors (in my care using the [employer]), agree:  Science writer given to me are supplied by independent, outside transportation providers who do not work for, or have any affiliation with, [employer]. [place] is not a transportation company. [place] has no control over the quality or safety of the rides I get using [employer]. [place] has no control over whether any outside ride will happen on time or not. [place] gives no guarantee on the reliability, quality, safety, or availability on any rides, or that no mistakes will happen. I know and accept that traveling by vehicle (car, truck, SVU, Zenaida Niece, bus, taxi, etc.) has risks of serious injuries such  as disability, being paralyzed, and death. I know and agree the risk of using [employer] is mine alone, and not [employer]. Transport Services are provided "as is" and as are available. The transportation providers are in charge for all inspections and care of the vehicles used to provide these rides. I agree not to take legal action against [place], its agents, employees, officers, directors, representatives, insurers, attorneys, assigns, successors, subsidiaries, and affiliates at any time for any reasons related directly or indirectly to using [employer]. I also agree not to take legal action against [place] or its affiliates for any injury, death, or damage to property caused by or related to using [employer]. I have read this Waiver and Release of Liability, and I understand the terms used in it and their legal meaning. This Waiver is freely and voluntarily given with the understanding that my right (or any legal minors) to legal action against  relating to [employer] is knowingly given up to use these services.   I attest that I read the Ride Waiver and Release of Liability to Hector Manning, gave Mr. Lawless the opportunity to ask questions and answered the questions asked (if any). I affirm that Hector Manning then provided consent for assistance with transportation.     Burna Sis

## 2023-09-08 ENCOUNTER — Encounter (HOSPITAL_COMMUNITY): Payer: Self-pay | Admitting: Cardiology

## 2023-09-08 ENCOUNTER — Ambulatory Visit (HOSPITAL_COMMUNITY)
Admission: RE | Admit: 2023-09-08 | Discharge: 2023-09-08 | Disposition: A | Discharge status: Home / Self Care | Payer: No Typology Code available for payment source | Source: Ambulatory Visit | Attending: Cardiology | Admitting: Cardiology

## 2023-09-08 VITALS — BP 100/60 | HR 74 | Wt 205.0 lb

## 2023-09-08 DIAGNOSIS — Z79899 Other long term (current) drug therapy: Secondary | ICD-10-CM | POA: Diagnosis not present

## 2023-09-08 DIAGNOSIS — I429 Cardiomyopathy, unspecified: Secondary | ICD-10-CM | POA: Insufficient documentation

## 2023-09-08 DIAGNOSIS — M109 Gout, unspecified: Secondary | ICD-10-CM | POA: Insufficient documentation

## 2023-09-08 DIAGNOSIS — I110 Hypertensive heart disease with heart failure: Secondary | ICD-10-CM | POA: Diagnosis not present

## 2023-09-08 DIAGNOSIS — Z91148 Patient's other noncompliance with medication regimen for other reason: Secondary | ICD-10-CM | POA: Insufficient documentation

## 2023-09-08 DIAGNOSIS — Z7901 Long term (current) use of anticoagulants: Secondary | ICD-10-CM | POA: Insufficient documentation

## 2023-09-08 DIAGNOSIS — I081 Rheumatic disorders of both mitral and tricuspid valves: Secondary | ICD-10-CM | POA: Diagnosis not present

## 2023-09-08 DIAGNOSIS — I5022 Chronic systolic (congestive) heart failure: Secondary | ICD-10-CM | POA: Insufficient documentation

## 2023-09-08 DIAGNOSIS — I513 Intracardiac thrombosis, not elsewhere classified: Secondary | ICD-10-CM | POA: Diagnosis not present

## 2023-09-08 MED ORDER — COLCHICINE 0.6 MG PO TABS: 0.6000 mg | ORAL_TABLET | Freq: Every day | ORAL | 0 refills | Status: DC

## 2023-09-08 MED ORDER — APIXABAN 5 MG PO TABS: 5.0000 mg | ORAL_TABLET | Freq: Two times a day (BID) | ORAL | 11 refills | Status: DC

## 2023-09-08 MED ORDER — SPIRONOLACTONE 25 MG PO TABS: 12.5000 mg | ORAL_TABLET | Freq: Every day | ORAL | 3 refills | Status: DC

## 2023-09-08 MED ORDER — ATORVASTATIN CALCIUM 40 MG PO TABS: 40.0000 mg | ORAL_TABLET | Freq: Every day | ORAL | 11 refills | Status: DC

## 2023-09-08 MED ORDER — FUROSEMIDE 20 MG PO TABS: 60.0000 mg | ORAL_TABLET | Freq: Every day | ORAL | 2 refills | Status: DC

## 2023-09-08 NOTE — Progress Notes (Signed)
   ADVANCED HEART FAILURE NEW PATIENT CLINIC NOTE  Referring Physician: No ref. provider found  Primary Care: Patient, No Pcp Per Primary Cardiologist:  HPI: Hector Manning is a 58 y.o. male with a PMH of prior substance use, HTN, HFrEF, LV thrombus who presents for initial visit for further evaluation and treatment of heart failure/cardiomyopathy.      Admitted 1/25 with CHF exacerbation. Had been out of meds for over a month. BP was severely elevated. Echo showed EF <20%, GIIIDD, and complex layered LV apical thrombus seen on echo. He was started on eliquis. Diuresed well with IV lasix. GDMT restarted.    He was seen in [facility] clinic 02/03 for hospital CHF follow-up. He was volume overloaded. ReDS 41%. He was instructed to take 2.5 mg metolazone X 1 and Torsemide increased to 60 mg daily. Jardiance restarted.      SUBJECTIVE: Patient reports that he has been out of medications for the past several days. He has not checked the pharmacy to get them refilled. He reports no change in his symptoms since stopping the medications. He does report some shortness of breath but is able to go outside and work in the yard. Lives with his mother. He has had some burning in his feet at night, concerned about gout.   PMH, current medications, allergies, social history, and family history reviewed in epic.  PHYSICAL EXAM: Vitals:   09/08/23 1437  BP: 100/60  Pulse: 74  SpO2: 100%   GENERAL: Well nourished and in no apparent distress at rest.  PULM:  Normal work of breathing, clear to auscultation bilaterally. Respirations are unlabored.  CARDIAC:  JVP: flat         Normal rate with regular rhythm. No murmurs, rubs or gallops.  No edema. Warm and well perfused extremities. ABDOMEN: Soft, non-tender, non-distended. NEUROLOGIC: Patient is oriented x3 with no focal or lateralizing neurologic deficits.    DATA REVIEW  ECHO: Echo 1/25: EF <20%, complex layered LV apical thrombus seen, GIIIDD, LV with  GHK, RV moderately reduced. LA/RA severely dilated, mild-mod MR, mod TR   CATH: L/RHC 10/23: Normal right heart hemodynamics with low right and left heart diastolic filling pressures, mean PA pressure of 17 mmHg, LVEDP 8 mmHg, wedge pressure of 5 mmHg. Widely patent coronary arteries with no obstructive CAD       ASSESSMENT & PLAN:  Chronic systolic heart failure: Unfortunately has run out of his medications over the past few days, they were all refilled at his last clinic visit. His HF is thought to be secondray to HTN versus ETOH use. Appears euvolemic. - Restart home medications, metoprolol succinate 25mg  daily, entresto 49/51mg  BID, spironolactone 12.5mg  daily, Jardiance 10mg  daily - NYHA Class II - APP clinic follow up in 1 month to assess medications, may benefit from pharmacy follow up in the future - Social work to reach out tomorrow - Repeat echo after GDMT maximized - Consider CMR if not improved  LV thrombus: From echo on 1/25. - Continue eliquis 5mg  BID  Valvular heart disease: Moderate MR/TR. - Optimization as above  HTN: Close follow up  Gout: Start colchicine 0.6mg  daily until follow up, assess for improvement  Follow up in 1 month  Hector Hasten, MD Advanced Heart Failure Mechanical Circulatory Support 09/08/23

## 2023-09-08 NOTE — Patient Instructions (Addendum)
 Start Clochicine .6 mg daily   Your physician recommends that you schedule a follow-up appointment in: as scheduled.  If you have any questions or concerns before your next appointment please send [place] a message through [place] or call our office at [phone].    TO LEAVE A MESSAGE FOR THE NURSE SELECT OPTION 2, PLEASE LEAVE A MESSAGE INCLUDING: YOUR NAME DATE OF BIRTH CALL BACK NUMBER REASON FOR CALL**this is important as we prioritize the call backs  YOU WILL RECEIVE A CALL BACK THE SAME DAY AS LONG AS YOU CALL BEFORE 4:00 PM  At the [facility], you and your health needs are our priority. As part of our continuing mission to provide you with exceptional heart care, we have created designated Provider Care Teams. These Care Teams include your primary Cardiologist (physician) and Advanced Practice Providers (APPs- Physician Assistants and Nurse Practitioners) who all work together to provide you with the care you need, when you need it.   You may see any of the following providers on your designated Care Team at your next follow up: Dr Arvilla Meres Dr Marca Ancona Dr. Dorthula Nettles Dr. [name] Amy Filbert Schilder, NP Robbie Lis, [place] [facility] Coin, [place] Brynda Peon, NP [place] Lee, NP Clarisa Kindred, NP Karle Plumber, PharmD Enos Fling, PharmD   Please be sure to bring in all your medications bottles to every appointment.    Thank you for choosing [facility]

## 2023-09-11 ENCOUNTER — Telehealth (HOSPITAL_COMMUNITY): Payer: Self-pay | Admitting: Family Medicine

## 2023-09-11 ENCOUNTER — Other Ambulatory Visit (HOSPITAL_COMMUNITY): Payer: Self-pay

## 2023-09-11 ENCOUNTER — Telehealth (HOSPITAL_COMMUNITY): Payer: Self-pay | Admitting: Licensed Clinical Social Worker

## 2023-09-11 NOTE — Telephone Encounter (Signed)
 Pt need transportation assist on 04/04 for APP appt. Please advise

## 2023-09-11 NOTE — Telephone Encounter (Signed)
 CSW attempted to call pt to see if he had been informed of Medicaid managed plan so I could assist him in understanding transportation benefit- unable to reach- left VM requesting return call  Burna Sis, LCSW Clinical Social Worker [facility] Desk#: [phone] Cell#: [phone]

## 2023-09-14 ENCOUNTER — Telehealth (HOSPITAL_COMMUNITY): Payer: Self-pay | Admitting: Licensed Clinical Social Worker

## 2023-09-14 NOTE — Telephone Encounter (Signed)
 H&V Care Navigation CSW Progress Note  Clinical Social Worker spoke with pt regarding transportation to 4/4 appt.  Pt has not heard which Medicaid plan he is enrolled with yet so can't assist with setting up transport at this time.  Since appt is so far away will wait till 3/31 to check back in to see if we have new Medicaid info or if we need to set up alternative transportation.   SDOH Screenings   Food Insecurity: Food Insecurity Present (08/03/2023)  Housing: Low Risk  (07/20/2023)  Recent Concern: Housing - High Risk (07/19/2023)  Transportation Needs: Unmet Transportation Needs (09/03/2023)  Utilities: Not At Risk (07/19/2023)  Alcohol Screen: Low Risk  (07/20/2023)  Financial Resource Strain: High Risk (08/25/2023)  Tobacco Use: Medium Risk (09/08/2023)   Burna Sis, LCSW Clinical Social Worker [facility] Desk#: [phone] Cell#: [phone]

## 2023-09-16 ENCOUNTER — Ambulatory Visit: Payer: Self-pay | Admitting: Nurse Practitioner

## 2023-09-28 ENCOUNTER — Other Ambulatory Visit (HOSPITAL_COMMUNITY): Payer: Self-pay

## 2023-09-28 NOTE — Progress Notes (Signed)
   ADVANCED HEART FAILURE NEW PATIENT CLINIC NOTE  Referring Physician: No ref. provider found  Primary Care: Patient, No Pcp Per Primary Cardiologist:  HPI: Hector Manning is a 58 y.o. male with a PMH of prior substance use, HTN, HFrEF, LV thrombus who presents for initial visit for further evaluation and treatment of heart failure/cardiomyopathy.      Admitted 1/25 with CHF exacerbation. Had been out of meds for over a month. BP was severely elevated. Echo showed EF <20%, GIIIDD, and complex layered LV apical thrombus seen on echo. He was started on eliquis. Diuresed well with IV lasix. GDMT restarted.    He was seen in [facility] clinic 02/03 for hospital CHF follow-up. He was volume overloaded. ReDS 41%. He was instructed to take 2.5 mg metolazone X 1 and Torsemide increased to 60 mg daily. Jardiance restarted.      SUBJECTIVE: Patient reports that he has been out of medications for the past several days. He has not checked the pharmacy to get them refilled. He reports no change in his symptoms since stopping the medications. He does report some shortness of breath but is able to go outside and work in the yard. Lives with his mother. He has had some burning in his feet at night, concerned about gout.   PMH, current medications, allergies, social history, and family history reviewed in epic.  PHYSICAL EXAM: There were no vitals filed for this visit.  GENERAL: Well nourished and in no apparent distress at rest.  PULM:  Normal work of breathing, clear to auscultation bilaterally. Respirations are unlabored.  CARDIAC:  JVP: flat         Normal rate with regular rhythm. No murmurs, rubs or gallops.  No edema. Warm and well perfused extremities. ABDOMEN: Soft, non-tender, non-distended. NEUROLOGIC: Patient is oriented x3 with no focal or lateralizing neurologic deficits.    DATA REVIEW  ECHO: Echo 1/25: EF <20%, complex layered LV apical thrombus seen, GIIIDD, LV with GHK, RV moderately  reduced. LA/RA severely dilated, mild-mod MR, mod TR   CATH: L/RHC 10/23: Normal right heart hemodynamics with low right and left heart diastolic filling pressures, mean PA pressure of 17 mmHg, LVEDP 8 mmHg, wedge pressure of 5 mmHg. Widely patent coronary arteries with no obstructive CAD       ASSESSMENT & PLAN:  Chronic systolic heart failure: Unfortunately has run out of his medications over the past few days, they were all refilled at his last clinic visit. His HF is thought to be secondray to HTN versus ETOH use. Appears euvolemic. - Restart home medications, metoprolol succinate 25mg  daily, entresto 49/51mg  BID, spironolactone 12.5mg  daily, Jardiance 10mg  daily - NYHA Class II - APP clinic follow up in 1 month to assess medications, may benefit from pharmacy follow up in the future - Social work to reach out tomorrow - Repeat echo after GDMT maximized - Consider CMR if not improved  LV thrombus: From echo on 1/25. - Continue eliquis 5mg  BID  Valvular heart disease: Moderate MR/TR. - Optimization as above  HTN: Close follow up  Gout: Start colchicine 0.6mg  daily until follow up, assess for improvement  Follow up in 1 month  [name], MD Advanced Heart Failure Mechanical Circulatory Support 09/28/23

## 2023-09-29 ENCOUNTER — Telehealth (HOSPITAL_COMMUNITY): Payer: Self-pay | Admitting: Licensed Clinical Social Worker

## 2023-09-29 NOTE — Telephone Encounter (Signed)
 H&V Care Navigation CSW Progress Note  Clinical Social Worker contacted patient to discuss transportation for appt this week.  Pt has not gotten medicaid details and unable to see what plan he is with through [facility] Tr[facility] so cannot arrange through payor source at this time.  CSW arranged ride through [employer]- pick up from home 1:15pm.   SDOH Screenings   Food Insecurity: Food Insecurity Present (08/03/2023)  Housing: Low Risk  (07/20/2023)  Recent Concern: Housing - High Risk (07/19/2023)  Transportation Needs: Unmet Transportation Needs (09/29/2023)  Utilities: Not At Risk (07/19/2023)  Alcohol Screen: Low Risk  (07/20/2023)  Financial Resource Strain: High Risk (08/25/2023)  Tobacco Use: Medium Risk (09/08/2023)    Burna Sis, LCSW Clinical Social Worker [facility] Desk#: [phone] Cell#: [phone]

## 2023-10-01 ENCOUNTER — Telehealth (HOSPITAL_COMMUNITY): Payer: Self-pay

## 2023-10-01 NOTE — Telephone Encounter (Signed)
 Called to confirm/remind patient of their appointment at the [facility] on 10/02/23.   Appointment:   [] Confirmed  [x] Left mess   [] No answer/No voice mail  [] Phone not in service  And to bring in all medications and/or complete list.

## 2023-10-02 ENCOUNTER — Ambulatory Visit (HOSPITAL_COMMUNITY)
Admission: RE | Admit: 2023-10-02 | Discharge: 2023-10-02 | Disposition: A | Discharge status: Home / Self Care | Source: Ambulatory Visit | Attending: Family Medicine | Admitting: Family Medicine

## 2023-10-02 ENCOUNTER — Encounter (HOSPITAL_COMMUNITY): Payer: Self-pay

## 2023-10-02 VITALS — BP 156/98 | HR 87 | Ht 72.0 in | Wt 213.8 lb

## 2023-10-02 DIAGNOSIS — I513 Intracardiac thrombosis, not elsewhere classified: Secondary | ICD-10-CM | POA: Diagnosis not present

## 2023-10-02 DIAGNOSIS — I10 Essential (primary) hypertension: Secondary | ICD-10-CM | POA: Diagnosis not present

## 2023-10-02 DIAGNOSIS — I5022 Chronic systolic (congestive) heart failure: Secondary | ICD-10-CM | POA: Diagnosis not present

## 2023-10-02 DIAGNOSIS — Z79899 Other long term (current) drug therapy: Secondary | ICD-10-CM | POA: Insufficient documentation

## 2023-10-02 DIAGNOSIS — Z7901 Long term (current) use of anticoagulants: Secondary | ICD-10-CM | POA: Diagnosis not present

## 2023-10-02 DIAGNOSIS — I110 Hypertensive heart disease with heart failure: Secondary | ICD-10-CM | POA: Insufficient documentation

## 2023-10-02 DIAGNOSIS — I081 Rheumatic disorders of both mitral and tricuspid valves: Secondary | ICD-10-CM | POA: Insufficient documentation

## 2023-10-02 DIAGNOSIS — Z7984 Long term (current) use of oral hypoglycemic drugs: Secondary | ICD-10-CM | POA: Diagnosis not present

## 2023-10-02 DIAGNOSIS — I38 Endocarditis, valve unspecified: Secondary | ICD-10-CM

## 2023-10-02 LAB — CBC
HCT: 45.2 % (ref 39.0–52.0)
Hemoglobin: 14.6 g/dL (ref 13.0–17.0)
MCH: 27.8 pg (ref 26.0–34.0)
MCHC: 32.3 g/dL (ref 30.0–36.0)
MCV: 85.9 fL (ref 80.0–100.0)
Platelets: 184 10*3/uL (ref 150–400)
RBC: 5.26 MIL/uL (ref 4.22–5.81)
RDW: 15.8 % — ABNORMAL HIGH (ref 11.5–15.5)
WBC: 7.2 10*3/uL (ref 4.0–10.5)
nRBC: 0 % (ref 0.0–0.2)

## 2023-10-02 LAB — BASIC METABOLIC PANEL WITH GFR
Anion gap: 9 (ref 5–15)
BUN: 17 mg/dL (ref 6–20)
CO2: 23 mmol/L (ref 22–32)
Calcium: 9.9 mg/dL (ref 8.9–10.3)
Chloride: 109 mmol/L (ref 98–111)
Creatinine, Ser: 1.08 mg/dL (ref 0.61–1.24)
GFR, Estimated: >60 mL/min (ref >60)
Glucose, Bld: 129 mg/dL — ABNORMAL HIGH (ref 70–99)
Potassium: 3.6 mmol/L (ref 3.5–5.1)
Sodium: 141 mmol/L (ref 135–145)

## 2023-10-02 LAB — BRAIN NATRIURETIC PEPTIDE: B Natriuretic Peptide: 199.3 pg/mL — ABNORMAL HIGH (ref 0.0–100.0)

## 2023-10-02 MED ORDER — FERROUS SULFATE 325 (65 FE) MG PO TABS: 325.0000 mg | ORAL_TABLET | ORAL | 3 refills | Status: DC

## 2023-10-02 MED ORDER — SPIRONOLACTONE 25 MG PO TABS: 12.5000 mg | ORAL_TABLET | Freq: Every day | ORAL | 3 refills | Status: DC

## 2023-10-02 MED ORDER — COLCHICINE 0.6 MG PO TABS: 0.6000 mg | ORAL_TABLET | ORAL | 1 refills | Status: DC | PRN

## 2023-10-02 MED ORDER — SACUBITRIL-VALSARTAN 49-51 MG PO TABS
1.0000 | ORAL_TABLET | Freq: Two times a day (BID) | ORAL | 11 refills | Status: DC
Start: 2023-10-02 — End: 2023-10-27

## 2023-10-02 MED ORDER — APIXABAN 5 MG PO TABS: 5.0000 mg | ORAL_TABLET | Freq: Two times a day (BID) | ORAL | 11 refills | Status: DC

## 2023-10-02 MED ORDER — EMPAGLIFLOZIN 10 MG PO TABS
10.0000 mg | ORAL_TABLET | Freq: Every day | ORAL | 11 refills | Status: DC
Start: 2023-10-02 — End: 2023-10-27

## 2023-10-02 MED ORDER — ATORVASTATIN CALCIUM 40 MG PO TABS: 40.0000 mg | ORAL_TABLET | Freq: Every day | ORAL | 3 refills | Status: DC

## 2023-10-02 MED ORDER — FUROSEMIDE 20 MG PO TABS: 40.0000 mg | ORAL_TABLET | ORAL | 3 refills | Status: DC | PRN

## 2023-10-02 NOTE — Patient Instructions (Addendum)
 Re-start Entresto 49/51 mg twice daily - Rx sent. Re-start Jardiance 10 mg daily - Rx sent. Change Lasix to 40 mg as needed for weight gain > 3 lbs overnight, >5 lbs in one week, increased swelling or shortness of breath.  Change colchicine to only as needed for gout flare - DO NOT TAKE EVERY DAY. All scripts sent to requested pharmacy at [facility] on [address]. Labs today - will call you if abnormal. Return to Heart Failure Pharmacy Clinic in 3 weeks - see below. Return to Heart Failure APP Clinic in 6 weeks - see below. Please call us at [phone] if any questions or concerns prior to your next visit.

## 2023-10-02 NOTE — Progress Notes (Signed)
 ReDS Vest / Clip - 10/02/23 1405       ReDS Vest / Clip   Station Marker C    Ruler Value 31    ReDS Value Range Low volume    ReDS Actual Value 29

## 2023-10-05 ENCOUNTER — Other Ambulatory Visit (HOSPITAL_COMMUNITY): Payer: Self-pay

## 2023-10-05 ENCOUNTER — Telehealth (HOSPITAL_COMMUNITY): Payer: Self-pay

## 2023-10-05 ENCOUNTER — Telehealth (HOSPITAL_COMMUNITY): Payer: Self-pay | Admitting: Licensed Clinical Social Worker

## 2023-10-05 NOTE — Telephone Encounter (Signed)
 Advanced Heart Failure Patient Advocate Encounter  Prior authorization for [place] has been submitted and approved. Test billing returns $4 for 90 day supply.  Key: [id number] Effective: 10/05/2023 to 10/04/2024  Burnell Blanks, CPhT Rx Patient Advocate Phone: [phone]

## 2023-10-05 NOTE — Telephone Encounter (Signed)
 H&V Care Navigation CSW Progress Note  Clinical Social Worker received call from pt requesting help with medication concerns- states new meds, Jardiance and Entresto, were not able to be filled by pharmacy but unable to articulate the reason.  CSW called pharmacy who reports that Medicaid ID does not match patients DOB in system.  CSW called [employer] worker who confirms DOB in system is wrong- able to update to correct DOB.   Discussed with pharmacy who was able to run successfully for entresto and needs PA for jardiance- pharmacy team informed of need for PA.  Pt updated regarding above.  Patient is participating in a Managed Medicaid Plan:  Yes  SDOH Screenings   Food Insecurity: Food Insecurity Present (08/03/2023)  Housing: Low Risk  (07/20/2023)  Recent Concern: Housing - High Risk (07/19/2023)  Transportation Needs: Unmet Transportation Needs (09/29/2023)  Utilities: Not At Risk (07/19/2023)  Alcohol Screen: Low Risk  (07/20/2023)  Financial Resource Strain: High Risk (08/25/2023)  Tobacco Use: Medium Risk (10/02/2023)   Burna Sis, LCSW Clinical Social Worker [facility] Desk#: [phone] Cell#: [phone]

## 2023-10-21 NOTE — Progress Notes (Signed)
 Advanced Heart Failure Clinic Note   Primary Care: Patient, No Pcp Per HF Cardiologist: Dr. Alease Amend  HPI:  Hector Manning is a 58 y.o. male with a PMH of prior substance use, HTN, HFrEF, and LV thrombus.    Admitted 07/2023 with CHF exacerbation. Had been out of meds for over a month. BP was severely elevated. Echo showed EF <20%, GIIIDD, and complex layered LV apical thrombus seen on echo. He was started on Eliquis . Diuresed well with IV Lasix . GDMT restarted.    He was seen in [facility] clinic 08/03/23 for hospital CHF follow-up. He was volume overloaded. ReDS 41%. He was instructed to take 2.5 mg metolazone  X 1 and Torsemide increased to 60 mg daily. Jardiance  restarted.    Follow up 08/2023, NYHA II and euvolemic. Off all meds x several days. GDMT restarted, social work engaged for resources.  Returned to [facility] for HF follow up on 10/02/23. Overall was feeling fine. Felt occasional palpitations. Denied abnormal bleeding, CP, dizziness, edema, or PND/Orthopnea. Appetite was ok. No fever or chills. Had not been weighing at home. Lives with mother. Had been off Jardiance , Entreso and metoprolol  succinate. Did not seem to know what meds he was taking.   Today he returns to HF clinic for pharmacist medication titration. At last visit with APP, Entresto  49/51 mg BID and Jardiance  were restarted. Additionally, Lasix  was changed to PRN only. Unfortunately he did not restart Jardiance  as he was having difficulties getting it from the pharmacy. He did restart Entresto  and has changed Lasix  to PRN only. Overall he is feeling well today. No dizziness or lightheadedness. No CP. Does note occasional palpitations, which he describes as a fluttering sensation that occurs for just a few seconds a couple times a week. Can walk 10 min at normal pace on flat ground before getting SOB and needing to rest. Also gets SOB quickly with stairs. Weight is up 6 lbs from last visit. Says weight at home has been 210 lbs when  he checks. He takes PRN Lasix  ~every 3 days. No LEE, PND or orthopnea. No abdominal bloating. Wanted medications changed to a different pharmacy since he no longer has a car and it is difficult to get to the [facility]. Set up for mailorder through [facility] today.  BP elevated at 164/98 in clinic but he has not taken his medications yet today. Does not check BP at home.      HF Medications: Entresto  49/51 mg BID Spironolactone  12.5 mg daily Jardiance  10 mg daily Lasix  40 mg PRN  Has the patient been experiencing any side effects to the medications prescribed?  no  Does the patient have any problems obtaining medications due to transportation or finances?   Yes - Wanted medications changed to a different pharmacy since he no longer has a car and it is difficult to get to the [facility]. Set up for mailorder through [facility] today. He has [place] Medicaid and copays are $4. Uses Medicaid for transport as he does not have a car.   Understanding of regimen: fair Understanding of indications: fair Potential of compliance: fair Patient understands to avoid NSAIDs. Patient understands to avoid decongestants.    Pertinent Lab Values: 10/02/23: Serum creatinine 1.08, BUN 17, Potassium 3.6, Sodium 141, BNP 199.3  Vital Signs: Weight: 219.2 lbs (last clinic weight: 213.8 lbs) Blood pressure: 164/98 (did not take morning medications) Heart rate: 62   Assessment/Plan: Chronic systolic heart failure:  - His HF is  thought to be secondray to HTN versus ETOH use.  - NYHA II, Appears euvolemic, however weight is up 6 lbs from last visit.  - Continue Lasix  40 mg PRN - Continue Entresto  49/51 mg BID. Hopefully can increase to target dose at next visit. I asked him to take all morning medications before his next appointment.  - Increase spironolactone  to 25 mg daily. Repeat BMET in 2 weeks at follow up. - Restart Jardiance  10 mg daily. - Repeat echo after GDMT maximized - Consider CMR if not  improved   2.  LV thrombus:  - From echo on 07/2023. - Continue Eliquis  5 mg BID - No bleeding issues.   3. Valvular heart disease: - Moderate MR/TR. - Optimization as above   4. HTN:  - BP elevated has not taken morning medications. I asked him to take all morning medications before his next appointment.   Follow up 2 weeks with [facility]   Luster Salters, PharmD, BCPS, BCCP, CPP Heart Failure Clinic Pharmacist [phone]

## 2023-10-27 ENCOUNTER — Other Ambulatory Visit: Payer: Self-pay

## 2023-10-27 ENCOUNTER — Ambulatory Visit (HOSPITAL_COMMUNITY)
Admission: RE | Admit: 2023-10-27 | Discharge: 2023-10-27 | Disposition: A | Discharge status: Home / Self Care | Source: Ambulatory Visit | Attending: Internal Medicine | Admitting: Internal Medicine

## 2023-10-27 ENCOUNTER — Other Ambulatory Visit (HOSPITAL_COMMUNITY): Payer: Self-pay

## 2023-10-27 DIAGNOSIS — Z7984 Long term (current) use of oral hypoglycemic drugs: Secondary | ICD-10-CM | POA: Insufficient documentation

## 2023-10-27 DIAGNOSIS — T383X6A Underdosing of insulin and oral hypoglycemic [antidiabetic] drugs, initial encounter: Secondary | ICD-10-CM | POA: Diagnosis not present

## 2023-10-27 DIAGNOSIS — I110 Hypertensive heart disease with heart failure: Secondary | ICD-10-CM | POA: Diagnosis present

## 2023-10-27 DIAGNOSIS — I081 Rheumatic disorders of both mitral and tricuspid valves: Secondary | ICD-10-CM | POA: Diagnosis not present

## 2023-10-27 DIAGNOSIS — Z91120 Patient's intentional underdosing of medication regimen due to financial hardship: Secondary | ICD-10-CM | POA: Diagnosis not present

## 2023-10-27 DIAGNOSIS — I513 Intracardiac thrombosis, not elsewhere classified: Secondary | ICD-10-CM | POA: Diagnosis present

## 2023-10-27 DIAGNOSIS — Z79899 Other long term (current) drug therapy: Secondary | ICD-10-CM | POA: Insufficient documentation

## 2023-10-27 DIAGNOSIS — I5022 Chronic systolic (congestive) heart failure: Secondary | ICD-10-CM | POA: Insufficient documentation

## 2023-10-27 MED ORDER — APIXABAN 5 MG PO TABS
5.0000 mg | ORAL_TABLET | Freq: Two times a day (BID) | ORAL | 3 refills | Status: DC
  Filled 2023-10-27: qty 90, 45d supply, fill #0

## 2023-10-27 MED ORDER — ATORVASTATIN CALCIUM 40 MG PO TABS
40.0000 mg | ORAL_TABLET | Freq: Every day | ORAL | 3 refills | Status: DC
  Filled 2023-10-27: qty 90, 90d supply, fill #0

## 2023-10-27 MED ORDER — SPIRONOLACTONE 25 MG PO TABS
25.0000 mg | ORAL_TABLET | Freq: Every day | ORAL | 3 refills | Status: DC
  Filled 2023-10-27: qty 90, 90d supply, fill #0

## 2023-10-27 MED ORDER — FUROSEMIDE 20 MG PO TABS
40.0000 mg | ORAL_TABLET | ORAL | 1 refills | Status: DC | PRN
  Filled 2023-10-27: qty 90, 45d supply, fill #0

## 2023-10-27 MED ORDER — SACUBITRIL-VALSARTAN 49-51 MG PO TABS
1.0000 | ORAL_TABLET | Freq: Two times a day (BID) | ORAL | 3 refills | Status: DC
  Filled 2023-10-27: qty 60, 30d supply, fill #0

## 2023-10-27 MED ORDER — EMPAGLIFLOZIN 10 MG PO TABS
10.0000 mg | ORAL_TABLET | Freq: Every day | ORAL | 3 refills | Status: DC
  Filled 2023-10-27: qty 90, 90d supply, fill #0

## 2023-10-27 NOTE — Patient Instructions (Signed)
 It was a pleasure seeing you today!  MEDICATIONS: -We are changing your medications today -Increase spironolactone  to 25 mg (1 tablet) daily -Start Jardiance  10 mg (1 tablet) daily -Call if you have questions about your medications.  NEXT APPOINTMENT: Return to clinic in 2 weeks with [facility].  In general, to take care of your heart failure: -Limit your fluid intake to 2 Liters (half-gallon) per day.   -Limit your salt intake to ideally 2-3 grams (2000-3000 mg) per day. -Weigh yourself daily and record, and bring that "weight diary" to your next appointment.  (Weight gain of 2-3 pounds in 1 day typically means fluid weight.) -The medications for your heart are to help your heart and help you live longer.   -Please contact us  before stopping any of your heart medications.  Call the clinic at [phone] with questions or to reschedule future appointments.

## 2023-10-28 ENCOUNTER — Other Ambulatory Visit: Payer: Self-pay

## 2023-10-29 ENCOUNTER — Other Ambulatory Visit (HOSPITAL_COMMUNITY): Payer: Self-pay

## 2023-10-30 ENCOUNTER — Other Ambulatory Visit: Payer: Self-pay

## 2023-11-02 ENCOUNTER — Other Ambulatory Visit: Payer: Self-pay

## 2023-11-12 ENCOUNTER — Telehealth (HOSPITAL_COMMUNITY): Payer: Self-pay | Admitting: *Deleted

## 2023-11-12 NOTE — Telephone Encounter (Signed)
 Called to confirm/remind patient of their appointment at the Advanced [facility] Failure Clinic on 11/13/23.       Appointment:              [] Confirmed             [x] Left mess              [] No answer/No voice mail             [] Phone not in service   Patient reminded to bring all medications and/or complete list.   Confirmed patient has transportation. Gave directions, instructed to utilize valet parking.

## 2023-11-13 ENCOUNTER — Encounter (HOSPITAL_COMMUNITY)

## 2023-11-13 NOTE — Progress Notes (Incomplete)
   ADVANCED HEART FAILURE CLINIC NOTE   Primary Care: Patient, No Pcp Per HF Cardiologist: Dr. Alease Amend  HPI: Hector Manning is a 58 y.o. male with a PMH of prior substance use, HTN, HFrEF, and LV thrombus.     Admitted 1/25 with CHF exacerbation. Had been out of meds for over a month. BP was severely elevated. Echo showed EF <20%, GIIIDD, and complex layered LV apical thrombus seen on echo. He was started on eliquis . Diuresed well with IV lasix . GDMT restarted.    He was seen in [facility] clinic 2/25 for hospital CHF follow-up. He was volume overloaded. ReDS 41%. He was instructed to take 2.5 mg metolazone  x 1 and torsemide increased to 60 mg daily. Jardiance  restarted.   Follow up 3/25, NYHA II and euvolemic. Off all meds x several days. GDMT restarted, social work engaged for resources   SUBJECTIVE: Today he returns for HF follow up. Overall feeling fine. Feels occasional palpitations. Denies palpitations, abnormal bleeding, CP, dizziness, edema, or PND/Orthopnea. Appetite ok. No fever or chills. Not weighing at home. Lives with mother. Off Jardiance , Entreso and Toprol . Does not seem to know what meds he is taking.  PMH, current medications, allergies, social history, and family history reviewed in epic.  Wt Readings from Last 3 Encounters:  10/27/23 99.4 kg (219 lb 3.2 oz)  10/02/23 97 kg (213 lb 12.8 oz)  09/08/23 93 kg (205 lb)   There were no vitals taken for this visit.  PHYSICAL EXAM: General:  NAD. No resp difficulty HEENT: Normal Neck: Supple. No JVD. Cor: Regular rate & rhythm. No rubs, gallops or murmurs. Lungs: Clear Abdomen: Soft, nontender, nondistended.  Extremities: No cyanosis, clubbing, rash, edema Neuro: Alert & oriented x 3, moves all 4 extremities w/o difficulty. Affect pleasant.  ReDs reading:  29 %, normal   DATA REVIEW  ECHO: 1/25: EF <20%, complex layered LV apical thrombus seen, GIIIDD, LV with GHK, RV moderately reduced. LA/RA severely dilated,  mild-mod MR, mod TR   CATH: L/RHC 10/23: Normal right heart hemodynamics with low right and left heart diastolic filling pressures, mean PA pressure of 17 mmHg, LVEDP 8 mmHg, wedge pressure of 5 mmHg. Widely patent coronary arteries with no obstructive CAD     ASSESSMENT & PLAN:  Chronic systolic heart failure:  - His HF is thought to be secondray to HTN versus ETOH use.  - NYHA II, Appears euvolemic. ReDs normal at 29% - Start Toprol  - Continue Entresto  49/51 mg bid. - Continue Jardiance  10 mg daily. - Continue Lasix  40 mg PRN - Continue spiro 25 mg daily. - Repeat echo after GDMT titrated. Consider CMR if not improved - Labs today  2.  LV thrombus:  - From echo on 1/25. - Continue Eliquis  5 mg bid - No bleeding issues.  3. Valvular heart disease: - Moderate MR/TR. - HF optimization as above  4. HTN:  - BP elevated - Restart meds as above  Follow up in 2-3 months with Dr. Alease Amend + echo.  Vernia Good, FNP-BC 11/13/23

## 2023-12-01 ENCOUNTER — Telehealth (HOSPITAL_COMMUNITY): Payer: Self-pay | Admitting: Cardiology

## 2023-12-01 NOTE — Telephone Encounter (Signed)
 Called to confirm/remind patient of their appointment at the Advanced Heart Failure Clinic on 12/01/2023.   Appointment:   [] Confirmed  [x] Left mess   [] No answer/No voice mail  [] VM Full/unable to leave message  [] Phone not in service  Patient reminded to bring all medications and/or complete list.  Confirmed patient has transportation. Gave directions, instructed to utilize valet parking.

## 2023-12-02 ENCOUNTER — Encounter (HOSPITAL_COMMUNITY): Admitting: Cardiology

## 2023-12-02 NOTE — Progress Notes (Incomplete)
   ADVANCED HEART FAILURE FOLLOW UP CLINIC NOTE  Referring Physician: No ref. provider found  Primary Care: Patient, No Pcp Per Primary Cardiologist:  HPI: Hector Manning is a 58 y.o. male who presents for follow up of ***.      Admitted 1/25 with CHF exacerbation. Had been out of meds for over a month. BP was severely elevated. Echo showed EF <20%, GIIIDD, and complex layered LV apical thrombus seen on echo. He was started on eliquis . Diuresed well with IV lasix . GDMT restarted.    He was seen in [facility] clinic 02/03 for hospital CHF follow-up. He was volume overloaded. ReDS 41%. He was instructed to take 2.5 mg metolazone  X 1 and Torsemide increased to 60 mg daily. Jardiance  restarted.      SUBJECTIVE:  PMH, current medications, allergies, social history, and family history reviewed in epic.  PHYSICAL EXAM: There were no vitals filed for this visit. GENERAL: Well nourished and in no apparent distress at rest.  PULM:  Normal work of breathing, clear to auscultation bilaterally. Respirations are unlabored.  CARDIAC:  JVP: ***         Normal rate with regular rhythm. No murmurs, rubs or gallops.  *** edema. Warm and well perfused extremities. ABDOMEN: Soft, non-tender, non-distended. NEUROLOGIC: Patient is oriented x3 with no focal or lateralizing neurologic deficits.    DATA REVIEW  ECG: ***    ECHO: ***   CATH: ***   Heart failure review: - Classification: {HFCLASS:30917} - Etiology: {Cardiomyopathy:30918} - NYHA Class:  - Volume status: {volumechf:30919} - ACEi/ARB/ARNI: {HF:30752} - Aldosterone antagonist: {HF:30752} - Beta-blocker: {HF:30752} - Digoxin: {HF:30752} - Hydralazine /Nitrates: {HF:30752} - SGLT2i: {HF:30752} - GLP-1: {GLP:30906} - Advanced therapies: {Advancedtherapies:30916} - ICD: {ICD:30901}  ASSESSMENT & PLAN:  ***  Follow up in ***  Arta Lark, MD Advanced Heart Failure Mechanical Circulatory Support 12/02/23

## 2023-12-10 ENCOUNTER — Encounter (HOSPITAL_COMMUNITY)

## 2023-12-25 ENCOUNTER — Telehealth (HOSPITAL_COMMUNITY): Payer: Self-pay

## 2023-12-25 NOTE — Progress Notes (Signed)
 ADVANCED HEART FAILURE CLINIC NOTE   Primary Care: Patient, No Pcp Per HF Cardiologist: Dr. Zenaida  HPI: Hector Manning is a 58 y.o. male with a PMH of prior substance use, HTN, HFrEF, and LV thrombus.     Admitted 1/25 with CHF exacerbation. Had been out of meds for over a month. BP was severely elevated. Echo showed EF <20%, GIIIDD, and complex layered LV apical thrombus seen on echo. He was started on eliquis . Diuresed well with IV lasix . GDMT restarted.    He was seen in [facility] clinic 02/25 for hospital CHF follow-up. He was volume overloaded. ReDS 41%. He was instructed to take 2.5 mg metolazone  x 1 dose, and torsemide increased to 60 mg daily. Jardiance  restarted.   Follow up 3/25, NYHA II and euvolemic. Off all meds x several days. GDMT restarted, social work engaged for resources  SUBJECTIVE: Today he returns for HF follow up. Overall feeling ok. He has new orthopnea and more SOB walking further distances on flat ground. Breathing worse in the heat. He has been taking his Lasix  every other day. He feels palpitations x 1 month. Occasional positional dizziness, no falls. Denies abnormal bleeding, CP, edema, or PND. Appetite ok. Weight at home 225 pounds. Taking all his medications. Lives with mother. No ETOH, tobacco, or drug use. Has not made PCP appt.  PMH, current medications, allergies, social history, and family history reviewed in epic.  Wt Readings from Last 3 Encounters:  12/28/23 105 kg (231 lb 6.4 oz)  10/27/23 99.4 kg (219 lb 3.2 oz)  10/02/23 97 kg (213 lb 12.8 oz)   BP 120/88   Pulse 71   Wt 105 kg (231 lb 6.4 oz)   SpO2 98%   BMI 31.38 kg/m   PHYSICAL EXAM: General:  NAD. No resp difficulty, walked into clinic HEENT: Normal Neck: Supple. JVP 10 Cor: Regular rate & rhythm. No rubs, gallops or murmurs. Lungs: Clear Abdomen: Soft, nontender, nondistended.  Extremities: No cyanosis, clubbing, rash, edema Neuro: Alert & oriented x 3, moves all 4 extremities w/o  difficulty. Affect pleasant.  ReDs reading:  36%, abnormal  ECG (personally reviewed): NSR 70 bpm  DATA REVIEW  ECHO: 1/25: EF < 20%, complex layered LV apical thrombus seen, GIIIDD, LV with GHK, RV moderately reduced. LA/RA severely dilated, mild-mod MR, mod TR   CATH: L/RHC 04/21/24: Normal right heart hemodynamics with low right and left heart diastolic filling pressures, mean PA pressure of 17 mmHg, LVEDP 8 mmHg, wedge pressure of 5 mmHg. Widely patent coronary arteries with no obstructive CAD     ASSESSMENT & PLAN:  Chronic systolic heart failure:  - His HF is thought to be secondray to HTN versus ETOH use.  - NYHA II, volume up and weight up. ReDs 36% - Increase Lasix  to 40 mg daily, add 20 KCL daily. - Continue Entresto  49/51 mg bid. - Continue Jardiance  10 mg daily. - Continue spironolactone  25 mg daily. - Repeat echo. - Consider CMR if EF not improved - BMET/BNP today, repeat BMET in 10-14 days  2.  LV thrombus:  - From echo on 1/25. - Continue Eliquis  5 mg bid. - No bleeding issues.  3. Valvular heart disease: - Moderate MR/TR. - Optimization as above.  4. HTN:  - BP stable - GDMT as above  Follow up in 6-8 weeks with Dr. Zenaida + echo.   He has been given a list of PCPs to establish care, I asked him to call.  Harlene  Corona, FNP-BC 12/28/23

## 2023-12-25 NOTE — Telephone Encounter (Signed)
 Called to confirm/remind patient of their appointment at the Advanced [facility] Failure Clinic on 12/28/23.   Appointment:   [] Confirmed  [x] Left mess   [] No answer/No voice mail  [] VM Full/unable to leave message  [] Phone not in service And to bring in all medications and/or complete list. .

## 2023-12-28 ENCOUNTER — Encounter (HOSPITAL_COMMUNITY): Payer: Self-pay

## 2023-12-28 ENCOUNTER — Ambulatory Visit (HOSPITAL_COMMUNITY): Payer: Self-pay | Admitting: Family Medicine

## 2023-12-28 ENCOUNTER — Ambulatory Visit (HOSPITAL_COMMUNITY)
Admission: RE | Admit: 2023-12-28 | Discharge: 2023-12-28 | Disposition: A | Discharge status: Home / Self Care | Source: Ambulatory Visit | Attending: Family Medicine | Admitting: Family Medicine

## 2023-12-28 VITALS — BP 120/88 | HR 71 | Wt 231.4 lb

## 2023-12-28 DIAGNOSIS — I10 Essential (primary) hypertension: Secondary | ICD-10-CM

## 2023-12-28 DIAGNOSIS — I5022 Chronic systolic (congestive) heart failure: Secondary | ICD-10-CM | POA: Insufficient documentation

## 2023-12-28 DIAGNOSIS — I081 Rheumatic disorders of both mitral and tricuspid valves: Secondary | ICD-10-CM | POA: Diagnosis not present

## 2023-12-28 DIAGNOSIS — I110 Hypertensive heart disease with heart failure: Secondary | ICD-10-CM | POA: Insufficient documentation

## 2023-12-28 DIAGNOSIS — Z7901 Long term (current) use of anticoagulants: Secondary | ICD-10-CM | POA: Diagnosis not present

## 2023-12-28 DIAGNOSIS — I513 Intracardiac thrombosis, not elsewhere classified: Secondary | ICD-10-CM | POA: Diagnosis not present

## 2023-12-28 DIAGNOSIS — I38 Endocarditis, valve unspecified: Secondary | ICD-10-CM

## 2023-12-28 DIAGNOSIS — Z7984 Long term (current) use of oral hypoglycemic drugs: Secondary | ICD-10-CM | POA: Diagnosis not present

## 2023-12-28 DIAGNOSIS — Z79899 Other long term (current) drug therapy: Secondary | ICD-10-CM | POA: Insufficient documentation

## 2023-12-28 DIAGNOSIS — Z86718 Personal history of other venous thrombosis and embolism: Secondary | ICD-10-CM | POA: Insufficient documentation

## 2023-12-28 DIAGNOSIS — R0601 Orthopnea: Secondary | ICD-10-CM | POA: Insufficient documentation

## 2023-12-28 LAB — BASIC METABOLIC PANEL WITH GFR
Anion gap: 9 (ref 5–15)
BUN: 16 mg/dL (ref 6–20)
CO2: 24 mmol/L (ref 22–32)
Calcium: 9.7 mg/dL (ref 8.9–10.3)
Chloride: 106 mmol/L (ref 98–111)
Creatinine, Ser: 1.09 mg/dL (ref 0.61–1.24)
GFR, Estimated: >60 mL/min (ref >60)
Glucose, Bld: 85 mg/dL (ref 70–99)
Potassium: 3.9 mmol/L (ref 3.5–5.1)
Sodium: 139 mmol/L (ref 135–145)

## 2023-12-28 LAB — BRAIN NATRIURETIC PEPTIDE: B Natriuretic Peptide: 155.6 pg/mL — ABNORMAL HIGH (ref 0.0–100.0)

## 2023-12-28 MED ORDER — POTASSIUM CHLORIDE CRYS ER 20 MEQ PO TBCR: 20.0000 meq | EXTENDED_RELEASE_TABLET | Freq: Every day | ORAL | 3 refills | Status: AC

## 2023-12-28 MED ORDER — FUROSEMIDE 20 MG PO TABS: 40.0000 mg | ORAL_TABLET | Freq: Every day | ORAL | 5 refills | Status: DC

## 2023-12-28 NOTE — Patient Instructions (Addendum)
 Medication Changes:  INCREASE LASIX  (FUROSEMIDE ) TO 40MG  DAILY   START POTASSIUM 20MEQ ONCE DAILY   Lab Work:  Labs done today, your results will be available in MyChart, we will contact you for abnormal readings  THEN LABS AGAIN IN 2 WEEKS AS SCHEDULED   PLEASE CALL YOUR PRIMARY CARE PHYSICIAN- [phone] DR. FOLASHADE PASEDA TO GET SCHEDULED FOR FOLLOW UP   Follow-Up in: 6-8 WEEKS AS SCHEDULED WITH DR. ZENAIDA--- WILL GET AN ECHOCARDIOGRAM ON THE SAME DAY   At the Advanced Heart Failure Clinic, you and your health needs are our priority. We have a designated team specialized in the treatment of Heart Failure. This Care Team includes your primary Heart Failure Specialized Cardiologist (physician), Advanced Practice Providers (APPs- Physician Assistants and Nurse Practitioners), and Pharmacist who all work together to provide you with the care you need, when you need it.   You may see any of the following providers on your designated Care Team at your next follow up:  Dr. Toribio Fuel Dr. Ezra Shuck Dr. Ria Commander Dr. Odis ZENAIDA Greig Mosses, NP Caffie Shed, [place] [facility] [place], [place] Beckey Coe, NP [place] Lee, NP Tinnie Redman, PharmD   Please be sure to bring in all your medications bottles to every appointment.   Need to Contact Us :  If you have any questions or concerns before your next appointment please send us  a message through [place] or call our office at [phone].    TO LEAVE A MESSAGE FOR THE NURSE SELECT OPTION 2, PLEASE LEAVE A MESSAGE INCLUDING: YOUR NAME DATE OF BIRTH CALL BACK NUMBER REASON FOR CALL**this is important as we prioritize the call backs  YOU WILL RECEIVE A CALL BACK THE SAME DAY AS LONG AS YOU CALL BEFORE 4:00 PM

## 2023-12-28 NOTE — Progress Notes (Signed)
 ReDS Vest / Clip - 12/28/23 0900       ReDS Vest / Clip   Station Marker C    Ruler Value 31    ReDS Value Range Moderate volume overload    ReDS Actual Value 36

## 2024-01-11 ENCOUNTER — Other Ambulatory Visit (HOSPITAL_COMMUNITY)

## 2024-01-15 ENCOUNTER — Telehealth: Payer: Self-pay | Admitting: Licensed Clinical Social Worker

## 2024-01-15 ENCOUNTER — Ambulatory Visit (HOSPITAL_COMMUNITY)
Admission: RE | Admit: 2024-01-15 | Discharge: 2024-01-15 | Disposition: A | Discharge status: Home / Self Care | Source: Ambulatory Visit | Attending: Cardiology | Admitting: Cardiology

## 2024-01-15 DIAGNOSIS — I5022 Chronic systolic (congestive) heart failure: Secondary | ICD-10-CM | POA: Diagnosis present

## 2024-01-15 LAB — BASIC METABOLIC PANEL WITH GFR
Anion gap: 9 (ref 5–15)
BUN: 13 mg/dL (ref 6–20)
CO2: 27 mmol/L (ref 22–32)
Calcium: 9.7 mg/dL (ref 8.9–10.3)
Chloride: 105 mmol/L (ref 98–111)
Creatinine, Ser: 1.15 mg/dL (ref 0.61–1.24)
GFR, Estimated: >60 mL/min (ref >60)
Glucose, Bld: 83 mg/dL (ref 70–99)
Potassium: 4.1 mmol/L (ref 3.5–5.1)
Sodium: 141 mmol/L (ref 135–145)

## 2024-01-15 NOTE — Telephone Encounter (Signed)
 H&V Care Navigation CSW Progress Note  Clinical Social Worker contacted patient by phone to f/u on request for assistance with paperwork per patient access team (child support paperwork per their report). LCSW attempted to call pt twice today, both times call was picked up, unable to hear anyone on the line at [phone]. Also received call back from that number but was still unable to hear him on the other line. Will re-attempt again as able.  Patient is participating in a Managed Medicaid Plan:  Yes- healthy blue  SDOH Screenings   Food Insecurity: Food Insecurity Present (08/03/2023)  Housing: Low Risk  (07/20/2023)  Recent Concern: Housing - High Risk (07/19/2023)  Transportation Needs: Unmet Transportation Needs (09/29/2023)  Utilities: Not At Risk (07/19/2023)  Alcohol Screen: Low Risk  (07/20/2023)  Financial Resource Strain: High Risk (08/25/2023)  Tobacco Use: Medium Risk (12/28/2023)    Marit Lark, MSW, LCSW Clinical Social Worker II [facility] Heart/Vascular Care Navigation  [phone]- work cell phone (preferred)

## 2024-01-15 NOTE — Telephone Encounter (Signed)
 H&V Care Navigation CSW Progress Note  Clinical Social Worker contacted patient by phone again to f/u on letter left regarding child support case. Noted that it is asking for medical records. Was able to speak with Hector Manning at [phone]. Explained records request and how to contact HIM to release documents needed. Provided their phone number and the email where he can send the letter from the lawyer and the requested documents. Hector Manning shares he has a new court date in August. We also discussed how to call or go online to ssa.gov to find copy of application for disability, Hector Manning started that application on his own, did not have agency or caseworker assistance. Also discussed that he should speak with lawyer to see if a notarized letter from his mother who provides room and board can suffice for proof of zero income.   Hector Manning was texted this and I provided him the information verbally as he made notes. Remain available as needed.   Patient is participating in a Managed Medicaid Plan:  Yes- note healthy blue and [employer] (commercial plan) on file  SDOH Screenings   Food Insecurity: Food Insecurity Present (08/03/2023)  Housing: Low Risk  (07/20/2023)  Recent Concern: Housing - High Risk (07/19/2023)  Transportation Needs: Unmet Transportation Needs (09/29/2023)  Utilities: Not At Risk (07/19/2023)  Alcohol Screen: Low Risk  (07/20/2023)  Financial Resource Strain: High Risk (08/25/2023)  Tobacco Use: Medium Risk (12/28/2023)    Marit Lark, MSW, LCSW Clinical Social Worker II [facility] Heart/Vascular Care Navigation  [phone]- work cell phone (preferred)

## 2024-02-12 ENCOUNTER — Telehealth (HOSPITAL_COMMUNITY): Payer: Self-pay | Admitting: Licensed Clinical Social Worker

## 2024-02-12 NOTE — Telephone Encounter (Signed)
 H&V Care Navigation CSW Progress Note  Clinical Social Worker received call from pt requesting help with letter for court stating he was applying for disability (records request from [employer] on file) and what his health condition is.  Provider agreeable and letter mailed to patient.  Patient is participating in a Managed Medicaid Plan:  Yes  SDOH Screenings   Food Insecurity: Food Insecurity Present (08/03/2023)  Housing: Low Risk  (07/20/2023)  Recent Concern: Housing - High Risk (07/19/2023)  Transportation Needs: Unmet Transportation Needs (09/29/2023)  Utilities: Not At Risk (07/19/2023)  Alcohol Screen: Low Risk  (07/20/2023)  Financial Resource Strain: High Risk (08/25/2023)  Tobacco Use: Medium Risk (12/28/2023)    Hector HILARIO Leech, LCSW Clinical Social Worker Advanced Heart Failure Clinic Desk#: [phone] Cell#: [phone]

## 2024-02-23 ENCOUNTER — Encounter (HOSPITAL_COMMUNITY): Payer: Self-pay | Admitting: Cardiology

## 2024-02-23 ENCOUNTER — Ambulatory Visit (HOSPITAL_BASED_OUTPATIENT_CLINIC_OR_DEPARTMENT_OTHER)
Admission: RE | Admit: 2024-02-23 | Discharge: 2024-02-23 | Disposition: A | Discharge status: Home / Self Care | Source: Ambulatory Visit | Attending: Cardiology | Admitting: Cardiology

## 2024-02-23 ENCOUNTER — Ambulatory Visit (HOSPITAL_COMMUNITY)
Admission: RE | Admit: 2024-02-23 | Discharge: 2024-02-23 | Disposition: A | Discharge status: Home / Self Care | Source: Ambulatory Visit | Attending: Family Medicine | Admitting: Family Medicine

## 2024-02-23 VITALS — BP 128/88 | HR 66 | Wt 237.8 lb

## 2024-02-23 DIAGNOSIS — R42 Dizziness and giddiness: Secondary | ICD-10-CM | POA: Insufficient documentation

## 2024-02-23 DIAGNOSIS — I10 Essential (primary) hypertension: Secondary | ICD-10-CM

## 2024-02-23 DIAGNOSIS — Z91148 Patient's other noncompliance with medication regimen for other reason: Secondary | ICD-10-CM | POA: Insufficient documentation

## 2024-02-23 DIAGNOSIS — I513 Intracardiac thrombosis, not elsewhere classified: Secondary | ICD-10-CM | POA: Diagnosis not present

## 2024-02-23 DIAGNOSIS — I5022 Chronic systolic (congestive) heart failure: Secondary | ICD-10-CM | POA: Diagnosis present

## 2024-02-23 DIAGNOSIS — Z86718 Personal history of other venous thrombosis and embolism: Secondary | ICD-10-CM | POA: Insufficient documentation

## 2024-02-23 DIAGNOSIS — I130 Hypertensive heart and chronic kidney disease with heart failure and stage 1 through stage 4 chronic kidney disease, or unspecified chronic kidney disease: Secondary | ICD-10-CM | POA: Diagnosis not present

## 2024-02-23 DIAGNOSIS — N189 Chronic kidney disease, unspecified: Secondary | ICD-10-CM | POA: Insufficient documentation

## 2024-02-23 DIAGNOSIS — I3139 Other pericardial effusion (noninflammatory): Secondary | ICD-10-CM | POA: Insufficient documentation

## 2024-02-23 DIAGNOSIS — I38 Endocarditis, valve unspecified: Secondary | ICD-10-CM | POA: Diagnosis not present

## 2024-02-23 DIAGNOSIS — Z79899 Other long term (current) drug therapy: Secondary | ICD-10-CM | POA: Diagnosis not present

## 2024-02-23 DIAGNOSIS — R0602 Shortness of breath: Secondary | ICD-10-CM | POA: Diagnosis not present

## 2024-02-23 LAB — BASIC METABOLIC PANEL WITH GFR
Anion gap: 9 (ref 5–15)
BUN: 13 mg/dL (ref 6–20)
CO2: 25 mmol/L (ref 22–32)
Calcium: 9.6 mg/dL (ref 8.9–10.3)
Chloride: 102 mmol/L (ref 98–111)
Creatinine, Ser: 1.18 mg/dL (ref 0.61–1.24)
GFR, Estimated: >60 mL/min (ref >60)
Glucose, Bld: 86 mg/dL (ref 70–99)
Potassium: 4 mmol/L (ref 3.5–5.1)
Sodium: 136 mmol/L (ref 135–145)

## 2024-02-23 LAB — ECHOCARDIOGRAM COMPLETE
Area-P 1/2: 2.54 cm2
S' Lateral: 3.3 cm

## 2024-02-23 LAB — BRAIN NATRIURETIC PEPTIDE: B Natriuretic Peptide: 56.3 pg/mL (ref 0.0–100.0)

## 2024-02-23 MED ORDER — EMPAGLIFLOZIN 10 MG PO TABS: 10.0000 mg | ORAL_TABLET | Freq: Every day | ORAL | 3 refills | Status: DC

## 2024-02-23 MED ORDER — FUROSEMIDE 20 MG PO TABS: 40.0000 mg | ORAL_TABLET | ORAL | Status: DC | PRN

## 2024-02-23 MED ORDER — SACUBITRIL-VALSARTAN 49-51 MG PO TABS: 1.0000 | ORAL_TABLET | Freq: Two times a day (BID) | ORAL | 3 refills | Status: DC

## 2024-02-23 MED ORDER — PERFLUTREN LIPID MICROSPHERE
1.0000 mL | INTRAVENOUS | Status: DC | PRN
  Administered 2024-02-23: 4 mL via INTRAVENOUS

## 2024-02-23 NOTE — Patient Instructions (Signed)
 STOP Eliquis .  PLEASE RESTART JARDIANCE  10 MG DAILY.  PLEASE RESTART ENTRESTO  49/51 MG Twice daily. 1 TABLET TWICE A DAY.  Labs done today, your results will be available in MyChart, we will contact you for abnormal readings.  Repeat blood work in 1 week.  Your physician recommends that you schedule a follow-up appointment in: 1 month.  If you have any questions or concerns before your next appointment please send us  a message through [place] or call our office at [phone].    TO LEAVE A MESSAGE FOR THE NURSE SELECT OPTION 2, PLEASE LEAVE A MESSAGE INCLUDING: YOUR NAME DATE OF BIRTH CALL BACK NUMBER REASON FOR CALL**this is important as we prioritize the call backs  YOU WILL RECEIVE A CALL BACK THE SAME DAY AS LONG AS YOU CALL BEFORE 4:00 PM  At the Advanced Heart Failure Clinic, you and your health needs are our priority. As part of our continuing mission to provide you with exceptional heart care, we have created designated Provider Care Teams. These Care Teams include your primary Cardiologist (physician) and Advanced Practice Providers (APPs- Physician Assistants and Nurse Practitioners) who all work together to provide you with the care you need, when you need it.   You may see any of the following providers on your designated Care Team at your next follow up: Dr Toribio Fuel Dr Ezra Shuck Dr. Ria Commander Dr. Morene Brownie Amy Lenetta, NP Caffie Shed, [place] [facility] [place], [place] Beckey Coe, NP [place] Lee, NP Ellouise Class, NP Tinnie Redman, PharmD Jaun Bash, PharmD   Please be sure to bring in all your medications bottles to every appointment.    Thank you for choosing [facility]

## 2024-02-23 NOTE — Progress Notes (Signed)
 ADVANCED HEART FAILURE CLINIC NOTE   Primary Care: Patient, No Pcp Per HF Cardiologist: Dr. Zenaida  HPI: Hector Manning is a 58 y.o. male with a PMH of prior substance use, HTN, HFrEF, and LV thrombus.     Admitted 1/25 with CHF exacerbation. Had been out of meds for over a month. BP was severely elevated. Echo showed EF <20%, GIIIDD, and complex layered LV apical thrombus seen on echo. He was started on eliquis . Diuresed well with IV lasix . GDMT restarted.    He was seen in [facility] clinic 02/25 for hospital CHF follow-up. He was volume overloaded. ReDS 41%. He was instructed to take 2.5 mg metolazone  x 1 dose, and torsemide increased to 60 mg daily. Jardiance  restarted.   Follow up 3/25, NYHA II and euvolemic. Off all meds x several days. GDMT restarted, social work engaged for resources  SUBJECTIVE: Today he returns for AHF follow up. Overall feeling alright. Denies palpitations, CP, edema, or PND/Orthopnea. Dizziness with standing sometimes. SOB with activity. Appetite ok, does not watch what he eats. No fever or chills. Does not weight at home. Has only been taking 2-3 medications as he was confused on what he was supposed to be on. Denies ETOH, tobacco or drug use.   PMH, current medications, allergies, social history, and family history reviewed in epic.  Wt Readings from Last 3 Encounters:  02/23/24 107.9 kg (237 lb 12.8 oz)  12/28/23 105 kg (231 lb 6.4 oz)  10/27/23 99.4 kg (219 lb 3.2 oz)   BP 128/88 (BP Location: Right Arm, Patient Position: Sitting, Cuff Size: Large)   Pulse 66   Wt 107.9 kg (237 lb 12.8 oz)   SpO2 99%   BMI 32.25 kg/m   PHYSICAL EXAM: General:  well appearing.  No respiratory difficulty. Walked into clinic.  Neck: JVD flat.  Cor: Regular rate & rhythm. No murmurs. Lungs: diminished Extremities: no edema  Neuro: alert & oriented x 3. Affect pleasant.   ECG (personally reviewed): NSR 70 bpm  ReDs reading: 30 %, normal  DATA REVIEW  ECHO: 1/25:  EF < 20%, complex layered LV apical thrombus seen, GIIIDD, LV with GHK, RV moderately reduced. LA/RA severely dilated, mild-mod MR, mod TR  Echo today, reviewed with Dr. Zenaida. EF 35-40%, no LV thrombus seen. Full read pending.   CATH: [facility] 04/21/24: Normal right heart hemodynamics with low right and left heart diastolic filling pressures, mean PA pressure of 17 mmHg, LVEDP 8 mmHg, wedge pressure of 5 mmHg. Widely patent coronary arteries with no obstructive CAD    ASSESSMENT & PLAN: Chronic systolic heart failure:  - His HF is thought to be secondray to HTN versus ETOH use.  - Echo today, reviewed with Dr. Zenaida. EF 35-40%, no LV thrombus seen. Full read pending.  - NYHA II, appears euvolemic on exam. ReDs 30%.  - Change lasix  to 20 mg daily PRN (has not taken in months) - Continue Entresto  49/51 mg, has only been taking daily. Asked him to take BID.  - Restart Jardiance  10 mg daily. Denies GU symptoms.  - Continue spironolactone  25 mg daily. - BMET/BNP today, repeat BMET next week.   2.  LV thrombus:  - From echo on 1/25. - No longer seen on echo today on Dr. Starla read.  - Stop Eliquis  (has not been taking either)  3. Valvular heart disease: - Moderate MR/TR. - Optimization as above. - Echo from today, pending  4. HTN:  - BP stable - GDMT  as above  Follow up in ~4 weeks with APP to ensure he's being compliant with his meds. Not candidate for paramedicine unfortunately. May benefit from bubble packs.   He has been given a list of PCPs to establish care. Asked for the list again today. Asked him to call.  Beckey LITTIE Coe AGACNP-BC  02/23/24

## 2024-02-23 NOTE — Progress Notes (Signed)
  Echocardiogram 2D Echocardiogram has been performed.  Koleen KANDICE Popper, RDCS 02/23/2024, 11:58 AM

## 2024-02-23 NOTE — Progress Notes (Signed)
 ReDS Vest / Clip - 02/23/24 1200       ReDS Vest / Clip   Station Marker D    Ruler Value 38    ReDS Value Range Low volume    ReDS Actual Value 30

## 2024-02-24 ENCOUNTER — Other Ambulatory Visit (HOSPITAL_COMMUNITY): Payer: Self-pay

## 2024-03-03 ENCOUNTER — Other Ambulatory Visit (HOSPITAL_COMMUNITY)

## 2024-03-09 ENCOUNTER — Other Ambulatory Visit (HOSPITAL_COMMUNITY)

## 2024-03-18 ENCOUNTER — Telehealth (HOSPITAL_COMMUNITY): Payer: Self-pay

## 2024-03-18 NOTE — Telephone Encounter (Signed)
 Called to confirm/remind patient of their appointment at the Advanced Heart Failure Clinic on 03/21/24.   Appointment:   [] Confirmed  [x] Left mess   [] No answer/No voice mail  [] VM Full/unable to leave message  [] Phone not in service  And to bring in all medications and/or complete list.

## 2024-03-21 ENCOUNTER — Ambulatory Visit (HOSPITAL_COMMUNITY): Payer: Self-pay | Admitting: Cardiology

## 2024-03-21 ENCOUNTER — Ambulatory Visit (HOSPITAL_COMMUNITY)
Admission: RE | Admit: 2024-03-21 | Discharge: 2024-03-21 | Disposition: A | Discharge status: Home / Self Care | Source: Ambulatory Visit | Attending: Cardiology | Admitting: Cardiology

## 2024-03-21 ENCOUNTER — Encounter (HOSPITAL_COMMUNITY): Payer: Self-pay

## 2024-03-21 VITALS — BP 136/94 | HR 84 | Ht 72.0 in | Wt 237.2 lb

## 2024-03-21 DIAGNOSIS — Z79899 Other long term (current) drug therapy: Secondary | ICD-10-CM | POA: Insufficient documentation

## 2024-03-21 DIAGNOSIS — I5022 Chronic systolic (congestive) heart failure: Secondary | ICD-10-CM

## 2024-03-21 DIAGNOSIS — R0609 Other forms of dyspnea: Secondary | ICD-10-CM | POA: Insufficient documentation

## 2024-03-21 DIAGNOSIS — Z87891 Personal history of nicotine dependence: Secondary | ICD-10-CM | POA: Insufficient documentation

## 2024-03-21 DIAGNOSIS — Z7984 Long term (current) use of oral hypoglycemic drugs: Secondary | ICD-10-CM | POA: Insufficient documentation

## 2024-03-21 DIAGNOSIS — I081 Rheumatic disorders of both mitral and tricuspid valves: Secondary | ICD-10-CM | POA: Diagnosis not present

## 2024-03-21 DIAGNOSIS — Z86718 Personal history of other venous thrombosis and embolism: Secondary | ICD-10-CM | POA: Insufficient documentation

## 2024-03-21 DIAGNOSIS — I110 Hypertensive heart disease with heart failure: Secondary | ICD-10-CM | POA: Diagnosis not present

## 2024-03-21 LAB — BASIC METABOLIC PANEL WITH GFR
Anion gap: 9 (ref 5–15)
BUN: 17 mg/dL (ref 6–20)
CO2: 26 mmol/L (ref 22–32)
Calcium: 9.7 mg/dL (ref 8.9–10.3)
Chloride: 104 mmol/L (ref 98–111)
Creatinine, Ser: 1.34 mg/dL — ABNORMAL HIGH (ref 0.61–1.24)
GFR, Estimated: >60 mL/min (ref >60)
Glucose, Bld: 90 mg/dL (ref 70–99)
Potassium: 3.8 mmol/L (ref 3.5–5.1)
Sodium: 139 mmol/L (ref 135–145)

## 2024-03-21 LAB — BRAIN NATRIURETIC PEPTIDE: B Natriuretic Peptide: 22.2 pg/mL (ref 0.0–100.0)

## 2024-03-21 MED ORDER — SACUBITRIL-VALSARTAN 97-103 MG PO TABS: 1.0000 | ORAL_TABLET | Freq: Two times a day (BID) | ORAL | 6 refills | Status: AC

## 2024-03-21 NOTE — Progress Notes (Signed)
 ReDS Vest / Clip - 03/21/24 1045       ReDS Vest / Clip   Station Marker D    Ruler Value 37    ReDS Value Range Low volume    ReDS Actual Value 24

## 2024-03-21 NOTE — Patient Instructions (Addendum)
 Medication Changes:  INCREASE ENTRESTO  97-103 TWICE DAILY   Lab Work:  Labs done today, your results will be available in MyChart, we will contact you for abnormal readings..   Follow-Up in:  WITH PHARMACY 4 WEEKS. DR. ZENAIDA 2 MONTHS AS SCHEDULED  At the Advanced [facility] Failure [facility], you and your health needs are our priority. We have a designated team specialized in the treatment of Heart Failure. This Care Team includes your primary Heart Failure Specialized Cardiologist (physician), Advanced Practice Providers (APPs- Physician Assistants and Nurse Practitioners), and Pharmacist who all work together to provide you with the care you need, when you need it.   You may see any of the following providers on your designated Care Team at your next follow up:  Dr. Toribio Fuel Dr. Ezra Shuck Dr. Ria Commander Dr. Odis ZENAIDA Greig Mosses, NP Caffie Shed, [place] [facility] [place], [place] Beckey Coe, NP [place] Lee, NP Tinnie Redman, PharmD   Please be sure to bring in all your medications bottles to every appointment.   Need to Contact Us :  If you have any questions or concerns before your next appointment please send us  a message through [place] or call our office at [phone].    TO LEAVE A MESSAGE FOR THE NURSE SELECT OPTION 2, PLEASE LEAVE A MESSAGE INCLUDING: YOUR NAME DATE OF BIRTH CALL BACK NUMBER REASON FOR CALL**this is important as we prioritize the call backs  YOU WILL RECEIVE A CALL BACK THE SAME DAY AS LONG AS YOU CALL BEFORE 4:00 PM

## 2024-03-21 NOTE — Progress Notes (Signed)
 ADVANCED HEART FAILURE CLINIC NOTE   Primary Care: Patient, No Pcp Per HF Cardiologist: Dr. Zenaida  Reason for visit: f/u for chronic systolic heart failure   HPI: Hector Manning is a 58 y.o. male with a PMH of prior substance use, HTN, HFrEF, and LV thrombus.     Admitted 1/25 with CHF exacerbation. Had been out of meds for over a month. BP was severely elevated. Echo showed EF <20%, GIIIDD, and complex layered LV apical thrombus seen on echo. He was started on eliquis . Diuresed well with IV lasix . GDMT restarted.    He was seen in [facility] clinic 02/25 for hospital CHF follow-up. He was volume overloaded. ReDS 41%. He was instructed to take 2.5 mg metolazone  x 1 dose, and torsemide increased to 60 mg daily. Jardiance  restarted.   Follow up 3/25, NYHA II and euvolemic. Off all meds x several days. GDMT restarted, social work engaged for resources.  SUBJECTIVE: Today he returns for AHF follow up. He denies resting dyspnea but complains of DOE, NYHA Class II-III, though appears euvolemic on exam. Wt is up 6 lb from 3 months ago, however ReDs normal at 24%. He denies CP. He reports full med compliance. BP 136/94. Decreased BS noted bilaterally on exam. He quit smoking.    PMH, current medications, allergies, social history, and family history reviewed in epic.  Wt Readings from Last 3 Encounters:  03/21/24 107.6 kg (237 lb 3.2 oz)  02/23/24 107.9 kg (237 lb 12.8 oz)  12/28/23 105 kg (231 lb 6.4 oz)   BP (!) 136/94   Pulse 84   Ht 6' (1.829 m)   Wt 107.6 kg (237 lb 3.2 oz)   SpO2 96%   BMI 32.17 kg/m   Physical Exam  ReDs 24%, normal  GENERAL: NAD Lungs- decreased BS at the bases bilaterally, R>L  CARDIAC:  JVP not elevated          Normal rate with regular rhythm. No MRG. No LEE ABDOMEN: Soft, non-tender, non-distended.  EXTREMITIES: Warm and well perfused.  NEUROLOGIC: No obvious FND   ECG : not performed   ReDs reading: 24%, normal   DATA REVIEW  ECHO: 1/25: EF <  20%, complex layered LV apical thrombus seen, GIIIDD, LV with GHK, RV moderately reduced. LA/RA severely dilated, mild-mod MR, mod TR  Echo today, reviewed with Dr. Zenaida. EF 35-40%, no LV thrombus seen. Full read pending.   CATH: [facility] 04/21/24: Normal right heart hemodynamics with low right and left heart diastolic filling pressures, mean PA pressure of 17 mmHg, LVEDP 8 mmHg, wedge pressure of 5 mmHg. Widely patent coronary arteries with no obstructive CAD    ASSESSMENT & PLAN: Chronic systolic heart failure:  - His HF is thought to be secondray to HTN versus ETOH use.  - Echo in 2023 EF 25-30% - LHC in 2023 showed no CAD  - Echo 8/24 EF 35-40%, no LV thrombus seen. RV normal.  - NYHA II-early III. Euvolemic on exam and by ReDs at 24%. Lung exam notable for decreased bibasilar breath sounds, R>>L. Will order CXR to r/o pleural effusions  - Continue Lasix  40 mg PRN. Will check BNP today, if elevated will increase to daily use  - Increase Entresto  to 97-103 mg bid - Continue Jardiance  10 mg daily  - Continue spironolactone  25 mg daily. - Check BMP today   2.  LV thrombus:  - From echo on 1/25. - No longer seen on repeat echo 8/25 - now off  Eliquis .   3. Valvular heart disease: - Moderate MR/TR on echo 1/25>>trivial on most recent echo 8/25.  - Suspect functional  - Continue HF diuretics/ GDMT per above   4. HTN:  - moderately elevated - HF GDMT titration w/ increase in Entresto  dose to 97-103 mg  - check BMP today   F/u w/ PharmD in 3-4 wks for continued med titration. F/u w/ Dr. Zenaida in 8 wks.   Caffie Shed, PA-C 03/21/2024

## 2024-04-06 ENCOUNTER — Ambulatory Visit (HOSPITAL_COMMUNITY)
Admission: EM | Admit: 2024-04-06 | Discharge: 2024-04-06 | Disposition: A | Discharge status: Home / Self Care | Attending: Physician Assistant | Admitting: Physician Assistant

## 2024-04-06 ENCOUNTER — Encounter (HOSPITAL_COMMUNITY): Payer: Self-pay

## 2024-04-06 DIAGNOSIS — B350 Tinea barbae and tinea capitis: Secondary | ICD-10-CM | POA: Diagnosis not present

## 2024-04-06 MED ORDER — SELENIUM SULFIDE 2.25 % EX SHAM: MEDICATED_SHAMPOO | CUTANEOUS | 0 refills | Status: AC

## 2024-04-06 MED ORDER — FLUCONAZOLE 150 MG PO TABS: 150.0000 mg | ORAL_TABLET | Freq: Every day | ORAL | 0 refills | Status: DC

## 2024-04-06 NOTE — ED Provider Notes (Signed)
 [facility]    CSN: [id number] Arrival date & time: 04/06/24  0802      History   Chief Complaint Chief Complaint  Patient presents with   Rash    HPI Hector Manning is a 58 y.o. male.   Patient complains of a rash on his head.  Patient reports that he wears a hard hat all day.  Patient reports that the rash is itchy.  He has not tried any medications.  Patient has a past medical history of kidney disease congestive heart failure and hypertension.  Patient denies any systemic complaints.  The history is provided by the patient. No language interpreter was used.  Rash   History reviewed. No pertinent past medical history.  Patient Active Problem List   Diagnosis Date Noted   Acute combined systolic and diastolic congestive heart failure (HCC) 07/18/2023   Essential hypertension 07/18/2023   CKD (chronic kidney disease) stage 3, GFR 30-59 ml/min (HCC) 07/18/2023   Continuous dependence on cigarette smoking 07/18/2023   Anemia of chronic disease 07/18/2023   Thrombocytopenia 07/18/2023   Acute exacerbation of CHF (congestive heart failure) (HCC) 07/18/2023   Elevated troponin 07/18/2023   HFrEF (heart failure with reduced ejection fraction) (HCC)    AKI (acute kidney injury)    Community acquired pneumonia    Acute congestive heart failure (HCC)    Acute hypoxemic respiratory failure (HCC) 04/24/2022   Status post surgery 06/25/2021    Past Surgical History:  Procedure Laterality Date   ABDOMINAL SURGERY     INCISION AND DRAINAGE WOUND WITH NERVE AND ARTERY REPAIR Left 06/24/2021   Procedure: LEFT WRIST VOLVAR LACERATION WOUND EXPLORATION. INCISION AND DRAINAGE WOUND WITH NERVE TIMES ONE, AND TENDON TIMES SIX, ARTERY TIMES ONE  REPAIR;  Surgeon: Alyse Lynwood, MD;  Location: [facility] OR;  Service: Orthopedics;  Laterality: Left;   RIGHT/LEFT HEART CATH AND CORONARY ANGIOGRAPHY N/A 04/29/2022   Procedure: RIGHT/LEFT HEART CATH AND CORONARY ANGIOGRAPHY;  Surgeon:  Wonda Sharper, MD;  Location: [facility] INVASIVE CV LAB;  Service: Cardiovascular;  Laterality: N/A;       Home Medications    Prior to Admission medications   Medication Sig Start Date End Date Taking? Authorizing Provider  fluconazole (DIFLUCAN) 150 MG tablet Take 1 tablet (150 mg total) by mouth daily. 04/06/24  Yes Lamika Connolly K, PA-C  sacubitril -valsartan  (ENTRESTO ) 97-103 MG Take 1 tablet by mouth 2 (two) times daily. 03/21/24  Yes Marcine, Brittainy M, PA-C  Selenium Sulfide 2.25 % SHAM Wash scalp with 10 ml a day 04/06/24  Yes Nayali Talerico K, PA-C  potassium chloride  SA (KLOR-CON  M) 20 MEQ tablet Take 1 tablet (20 mEq total) by mouth daily. 12/28/23   Glena Harlene HERO, FNP    Family History History reviewed. No pertinent family history.  Social History Social History   Tobacco Use   Smoking status: Former    Current packs/day: 0.00    Types: Cigarettes    Quit date: 04/13/2022    Years since quitting: 1.9   Smokeless tobacco: Never  Vaping Use   Vaping status: Never Used  Substance Use Topics   Alcohol use: Not Currently    Comment: couple beers a day   Drug use: No     Allergies   Patient has no known allergies.   Review of Systems Review of Systems  Skin:  Positive for rash.  All other systems reviewed and are negative.    Physical Exam Triage Vital Signs ED Triage Vitals [  04/06/24 0820]  Encounter Vitals Group     BP (!) 153/85     Girls Systolic BP Percentile      Girls Diastolic BP Percentile      Boys Systolic BP Percentile      Boys Diastolic BP Percentile      Pulse Rate 84     Resp 18     Temp 98.5 F (36.9 C)     Temp Source Oral     SpO2 95 %     Weight      Height      Head Circumference      Peak Flow      Pain Score      Pain Loc      Pain Education      Exclude from Growth Chart    No data found.  Updated Vital Signs BP (!) 153/85 (BP Location: Left Arm)   Pulse 84   Temp 98.5 F (36.9 C) (Oral)   Resp 18   SpO2 95%    Visual Acuity Right Eye Distance:   Left Eye Distance:   Bilateral Distance:    Right Eye Near:   Left Eye Near:    Bilateral Near:     Physical Exam Vitals reviewed.  Constitutional:      Appearance: Normal appearance.  Cardiovascular:     Rate and Rhythm: Normal rate.  Pulmonary:     Effort: Pulmonary effort is normal.  Musculoskeletal:        General: Normal range of motion.  Skin:    Comments: Raised, rash, some scaling, central clearing with hair loss.    Neurological:     General: No focal deficit present.     Mental Status: He is alert.      UC Treatments / Results  Labs (all labs ordered are listed, but only abnormal results are displayed) Labs Reviewed - No data to display  EKG   Radiology No results found.  Procedures Procedures (including critical care time)  Medications Ordered in UC Medications - No data to display  Initial Impression / Assessment and Plan / UC Course  I have reviewed the triage vital signs and the nursing notes.  Pertinent labs & imaging results that were available during my care of the patient were reviewed by me and considered in my medical decision making (see chart for details).     Probable tinea capitus.  Pt given rx for lotrimin and selenium sulfide.  Pt advised to return if any problems.  Follow up with primary care for recheck in 2 weeks  Final Clinical Impressions(s) / UC Diagnoses   Final diagnoses:  Tinea capitis   Discharge Instructions   None    ED Prescriptions     Medication Sig Dispense Auth. Provider   fluconazole (DIFLUCAN) 150 MG tablet Take 1 tablet (150 mg total) by mouth daily. 60 tablet Rochele Lueck K, PA-C   Selenium Sulfide 2.25 % SHAM Wash scalp with 10 ml a day 140 mL [place] Sonny POUR, PA-C      PDMP not reviewed this encounter. An After Visit Summary was printed and given to the patient.       [place] Sonny POUR, PA-C 04/06/24 1053

## 2024-04-06 NOTE — ED Triage Notes (Signed)
 Rash presents with rash on top of his head x 1 week. Patient has not use any medication to help with symptoms.

## 2024-04-08 ENCOUNTER — Other Ambulatory Visit (HOSPITAL_COMMUNITY): Payer: Self-pay

## 2024-04-08 ENCOUNTER — Telehealth (HOSPITAL_COMMUNITY): Payer: Self-pay | Admitting: Family Medicine

## 2024-04-08 ENCOUNTER — Other Ambulatory Visit (HOSPITAL_COMMUNITY)

## 2024-04-08 MED ORDER — GRISEOFULVIN MICROSIZE 500 MG PO TABS
500.0000 mg | ORAL_TABLET | Freq: Every day | ORAL | 0 refills | Status: DC
  Filled 2024-04-08: qty 30, 30d supply, fill #0

## 2024-04-08 NOTE — Telephone Encounter (Signed)
 Pharmacy called in regards to diflucan script given 2 days ago.  They will not dispense #60 pills.  Recommended treatment is griseofulvin .  Will send to pharmacy at this time, 500mg  daily x 6 weeks.  Will see if insurance will approve

## 2024-04-09 ENCOUNTER — Other Ambulatory Visit (HOSPITAL_COMMUNITY): Payer: Self-pay

## 2024-04-09 ENCOUNTER — Telehealth (HOSPITAL_COMMUNITY): Payer: Self-pay | Admitting: *Deleted

## 2024-04-09 MED ORDER — GRISEOFULVIN MICROSIZE 500 MG PO TABS: 500.0000 mg | ORAL_TABLET | Freq: Every day | ORAL | 0 refills | Status: AC

## 2024-04-09 NOTE — Telephone Encounter (Signed)
 Pharmacy called to check status of alt for diflucan they state they dont have it. Looks like it was sent to [facility] pharmacy. She states that [facility] can not get rx until Monday. So she will call [facility] pharmacy to see if they have it in stock. If [facility] has pharmacy has rx the will have pt go there for RX instead.   Rx sen to [facility] just in case they need to fill rx.

## 2024-04-11 ENCOUNTER — Telehealth (HOSPITAL_COMMUNITY): Payer: Self-pay

## 2024-04-11 MED ORDER — TERBINAFINE HCL 250 MG PO TABS: 250.0000 mg | ORAL_TABLET | Freq: Every day | ORAL | 0 refills | Status: AC

## 2024-04-11 NOTE — Telephone Encounter (Signed)
 Pt called stating pharmacy could not fill previous prescription(s).   Per P. Banister, MD, looks like terbinafine is first line tx for tinea capitis according to my ID reference. please sent in Terbinafine 250 mg, #14, no refill, 1 tablet daily for 2 weeks, and f/u with PCP. Make sure (please) that the fluconazole and griseofulvin  rx's are cancelled so that that doesn't cause further problems.  Reviewed with patient, verified pharmacy, prescription sent

## 2024-04-17 NOTE — Progress Notes (Incomplete)
 ***In Progress***    Advanced Heart Failure Clinic Note   HPI:  Hector Manning is a 58 y.o. male with a PMH of prior substance use, HTN, HFrEF, and LV thrombus.   Admitted 07/2023 with CHF exacerbation. Had been out of meds for over a month. BP was severely elevated. Echo showed EF <20%, GIIIDD, and complex layered LV apical thrombus seen on echo. He was started on eliquis . Diuresed well with IV lasix . GDMT restarted.    He was seen in [facility] clinic 08/2023 for hospital CHF follow-up. He was volume overloaded. ReDS 41%. He was instructed to take 2.5 mg metolazone  x 1 dose, and torsemide increased to 60 mg daily. Jardiance  restarted.    Follow up 08/2023, NYHA II and euvolemic. Off all medications for several days. GDMT restarted, social work engaged for resources.  Returned for AHF follow up with PA-C on 03/21/24. He denied resting dyspnea but complains of DOE, NYHA Class II-III, though appears euvolemic on exam. Weight was up 6 lb from 3 months ago, however ReDs normal at 24%. He denies CP. He reports full medication compliance. BP 136/94. He reported quitting smoking.   Today he returns to HF clinic for pharmacist medication titration. At last visit with PA-C, Entresto  was increased to 97/103 mg BID. ***   Shortness of breath/dyspnea on exertion? {YES NO:22349}  Orthopnea/PND? {YES NO:22349} Edema? {YES NO:22349} Lightheadedness/dizziness? {YES NO:22349} Daily weights at home? {YES NO:22349} Blood pressure/heart rate monitoring at home? {YES [id number] Following low-sodium/fluid-restricted diet? {YES NO:22349}  HF Medications: ***update medication list*** Entresto  97/103 mg BID *** Spironolactone  25 mg daily *** Jardiance  10 mg daily *** Furosemide  40 mg PRN ***  *** KCL 20 mEq daily ***  Has the patient been experiencing any side effects to the medications prescribed?  {YES NO:22349}  Does the patient have any problems obtaining medications due to transportation or finances?   {YES  NO:22349}  Understanding of regimen: {excellent/good/fair/poor:19665} Understanding of indications: {excellent/good/fair/poor:19665} Potential of compliance: {excellent/good/fair/poor:19665} Patient understands to avoid NSAIDs. Patient understands to avoid decongestants.    Pertinent Lab Values: (03/21/24) Serum creatinine 1.34 mg/dL, BUN 17 mg/dL, Potassium 3.8 mmol/L, Sodium 139 mmol/L, BNP 22.2 pg/mL  Vital Signs: Weight: *** (last clinic weight: 237 lbs) Blood pressure: *** (last clinic BP: 136/94 mmHg) *** Heart rate: *** (last clinic HR: 84 bpm) ***  Assessment/Plan: Chronic systolic heart failure:  - His HF is thought to be secondray to HTN versus ETOH use.  - Echo in 2023 EF 25-30% - LHC in 2023 showed no CAD  - Echo 8/24 EF 35-40%, no LV thrombus seen. RV normal.  - NYHA II-early III. Euvolemic on exam and by ReDs at 24%. Lung exam notable for decreased bibasilar breath sounds, R>>L. Will order CXR to r/o pleural effusions  - Continue furosemide  40 mg PRN *** - Continue Entresto  to 97-103 mg bid *** - Continue Jardiance  10 mg daily *** - Continue spironolactone  25 mg daily. *** - Check BMP today    2.  LV thrombus:  - From echo on 07/2023. - No longer seen on repeat echo 01/2024 - now off Eliquis .    3. Valvular heart disease: - Moderate MR/TR on echo 07/2023>>trivial on most recent echo 01/2024.  - Suspect functional  - Continue HF diuretics/ GDMT per above    4. HTN:  - moderately elevated - HF GDMT titration w/ increase in Entresto  dose to 97-103 mg *** - check BMP today   Follow up ***  Morna Breach,  PharmD PGY2 Cardiology Pharmacy Resident

## 2024-04-18 ENCOUNTER — Other Ambulatory Visit (HOSPITAL_COMMUNITY): Payer: Self-pay

## 2024-04-18 ENCOUNTER — Inpatient Hospital Stay (HOSPITAL_COMMUNITY)
Admission: RE | Admit: 2024-04-18 | Discharge: 2024-04-18 | Disposition: A | Discharge status: Home / Self Care | Source: Ambulatory Visit

## 2024-05-19 ENCOUNTER — Telehealth (HOSPITAL_COMMUNITY): Payer: Self-pay | Admitting: Cardiology

## 2024-05-19 NOTE — Telephone Encounter (Signed)
 Called to confirm/remind patient of their appointment at the [facility] on 05/19/2024.   Appointment:   [] Confirmed  [] Left mess   [] No answer/No voice mail  [x] VM Full/unable to leave message  [] Phone not in service  Patient reminded to bring all medications and/or complete list.  Confirmed patient has transportation. Gave directions, instructed to utilize valet parking.

## 2024-05-20 ENCOUNTER — Encounter (HOSPITAL_COMMUNITY): Admitting: Cardiology
